# Patient Record
Sex: Female | Born: 1950 | Race: White | Hispanic: No | Marital: Single | Smoking: Never smoker
Health system: Southern US, Community
[De-identification: ages and names within clinical notes are randomized; demographics above are authoritative.]

## PROBLEM LIST (undated history)

## (undated) DIAGNOSIS — K219 Gastro-esophageal reflux disease without esophagitis: Secondary | ICD-10-CM

## (undated) DIAGNOSIS — F32A Depression, unspecified: Secondary | ICD-10-CM

## (undated) DIAGNOSIS — C801 Malignant (primary) neoplasm, unspecified: Secondary | ICD-10-CM

## (undated) DIAGNOSIS — J45909 Unspecified asthma, uncomplicated: Secondary | ICD-10-CM

## (undated) DIAGNOSIS — E785 Hyperlipidemia, unspecified: Secondary | ICD-10-CM

## (undated) DIAGNOSIS — R609 Edema, unspecified: Secondary | ICD-10-CM

## (undated) DIAGNOSIS — J309 Allergic rhinitis, unspecified: Secondary | ICD-10-CM

## (undated) DIAGNOSIS — M810 Age-related osteoporosis without current pathological fracture: Secondary | ICD-10-CM

## (undated) DIAGNOSIS — C50919 Malignant neoplasm of unspecified site of unspecified female breast: Secondary | ICD-10-CM

## (undated) DIAGNOSIS — F329 Major depressive disorder, single episode, unspecified: Secondary | ICD-10-CM

## (undated) DIAGNOSIS — I10 Essential (primary) hypertension: Secondary | ICD-10-CM

## (undated) DIAGNOSIS — Z9221 Personal history of antineoplastic chemotherapy: Secondary | ICD-10-CM

## (undated) HISTORY — PX: WRIST SURGERY: SHX841

## (undated) HISTORY — PX: MASTECTOMY: SHX3

---

## 2004-04-25 ENCOUNTER — Ambulatory Visit: Payer: Self-pay | Admitting: Unknown Physician Specialty

## 2006-08-03 ENCOUNTER — Emergency Department: Payer: Self-pay

## 2006-08-07 ENCOUNTER — Other Ambulatory Visit: Payer: Self-pay

## 2006-08-08 ENCOUNTER — Ambulatory Visit: Payer: Self-pay | Admitting: Orthopaedic Surgery

## 2008-10-26 ENCOUNTER — Ambulatory Visit: Payer: Self-pay | Admitting: Unknown Physician Specialty

## 2017-01-15 ENCOUNTER — Other Ambulatory Visit: Payer: Self-pay | Admitting: Student

## 2017-01-15 DIAGNOSIS — R197 Diarrhea, unspecified: Secondary | ICD-10-CM

## 2017-01-15 DIAGNOSIS — R110 Nausea: Secondary | ICD-10-CM

## 2017-01-16 ENCOUNTER — Other Ambulatory Visit
Admission: RE | Admit: 2017-01-16 | Discharge: 2017-01-16 | Disposition: A | Discharge status: Home / Self Care | Payer: Medicare Other | Source: Ambulatory Visit | Attending: Student | Admitting: Student

## 2017-01-16 DIAGNOSIS — R197 Diarrhea, unspecified: Secondary | ICD-10-CM | POA: Diagnosis present

## 2017-01-16 DIAGNOSIS — R634 Abnormal weight loss: Secondary | ICD-10-CM | POA: Diagnosis present

## 2017-01-16 LAB — GASTROINTESTINAL PANEL BY PCR, STOOL (REPLACES STOOL CULTURE)
ADENOVIRUS F40/41: NOT DETECTED
ASTROVIRUS: NOT DETECTED
Campylobacter species: NOT DETECTED
Cryptosporidium: NOT DETECTED
Cyclospora cayetanensis: NOT DETECTED
ENTAMOEBA HISTOLYTICA: NOT DETECTED
ENTEROAGGREGATIVE E COLI (EAEC): NOT DETECTED
ENTEROTOXIGENIC E COLI (ETEC): NOT DETECTED
Enteropathogenic E coli (EPEC): NOT DETECTED
GIARDIA LAMBLIA: NOT DETECTED
Norovirus GI/GII: NOT DETECTED
Plesimonas shigelloides: NOT DETECTED
ROTAVIRUS A: NOT DETECTED
SALMONELLA SPECIES: NOT DETECTED
Sapovirus (I, II, IV, and V): NOT DETECTED
Shiga like toxin producing E coli (STEC): NOT DETECTED
Shigella/Enteroinvasive E coli (EIEC): NOT DETECTED
Vibrio cholerae: NOT DETECTED
Vibrio species: NOT DETECTED
Yersinia enterocolitica: NOT DETECTED

## 2017-01-16 LAB — C DIFFICILE QUICK SCREEN W PCR REFLEX
C DIFFICILE (CDIFF) TOXIN: NEGATIVE
C DIFFICLE (CDIFF) ANTIGEN: NEGATIVE
C Diff interpretation: NOT DETECTED

## 2017-01-18 LAB — H. PYLORI ANTIGEN, STOOL: H. Pylori Stool Ag, Eia: NEGATIVE

## 2017-01-19 LAB — PANCREATIC ELASTASE, FECAL

## 2017-01-23 ENCOUNTER — Emergency Department: Payer: Medicare Other

## 2017-01-23 ENCOUNTER — Emergency Department
Admission: EM | Admit: 2017-01-23 | Discharge: 2017-01-24 | Disposition: A | Discharge status: Home / Self Care | Payer: Medicare Other | Attending: Emergency Medicine | Admitting: Emergency Medicine

## 2017-01-23 ENCOUNTER — Encounter: Payer: Self-pay | Admitting: *Deleted

## 2017-01-23 DIAGNOSIS — R5383 Other fatigue: Secondary | ICD-10-CM

## 2017-01-23 DIAGNOSIS — R1110 Vomiting, unspecified: Secondary | ICD-10-CM | POA: Diagnosis not present

## 2017-01-23 DIAGNOSIS — I951 Orthostatic hypotension: Secondary | ICD-10-CM

## 2017-01-23 DIAGNOSIS — R748 Abnormal levels of other serum enzymes: Secondary | ICD-10-CM

## 2017-01-23 DIAGNOSIS — R197 Diarrhea, unspecified: Secondary | ICD-10-CM | POA: Diagnosis present

## 2017-01-23 LAB — URINALYSIS, COMPLETE (UACMP) WITH MICROSCOPIC
Bacteria, UA: NONE SEEN
Bilirubin Urine: NEGATIVE
GLUCOSE, UA: NEGATIVE mg/dL
Hgb urine dipstick: NEGATIVE
Ketones, ur: 5 mg/dL — AB
Nitrite: NEGATIVE
PH: 5 (ref 5.0–8.0)
Protein, ur: NEGATIVE mg/dL
SPECIFIC GRAVITY, URINE: 1.012 (ref 1.005–1.030)

## 2017-01-23 LAB — COMPREHENSIVE METABOLIC PANEL
ALK PHOS: 45 U/L (ref 38–126)
ALT: 19 U/L (ref 14–54)
AST: 29 U/L (ref 15–41)
Albumin: 3.8 g/dL (ref 3.5–5.0)
Anion gap: 16 — ABNORMAL HIGH (ref 5–15)
BUN: 18 mg/dL (ref 6–20)
CALCIUM: 8.8 mg/dL — AB (ref 8.9–10.3)
CO2: 24 mmol/L (ref 22–32)
CREATININE: 1.46 mg/dL — AB (ref 0.44–1.00)
Chloride: 93 mmol/L — ABNORMAL LOW (ref 101–111)
GFR calc non Af Amer: 36 mL/min — ABNORMAL LOW (ref >60)
GFR, EST AFRICAN AMERICAN: 41 mL/min — AB (ref >60)
GLUCOSE: 129 mg/dL — AB (ref 65–99)
Potassium: 3 mmol/L — ABNORMAL LOW (ref 3.5–5.1)
SODIUM: 133 mmol/L — AB (ref 135–145)
Total Bilirubin: 1.1 mg/dL (ref 0.3–1.2)
Total Protein: 7.2 g/dL (ref 6.5–8.1)

## 2017-01-23 LAB — CBC
HCT: 44.2 % (ref 35.0–47.0)
Hemoglobin: 15.5 g/dL (ref 12.0–16.0)
MCH: 34.4 pg — AB (ref 26.0–34.0)
MCHC: 35 g/dL (ref 32.0–36.0)
MCV: 98.5 fL (ref 80.0–100.0)
PLATELETS: 391 10*3/uL (ref 150–440)
RBC: 4.49 MIL/uL (ref 3.80–5.20)
RDW: 12.1 % (ref 11.5–14.5)
WBC: 16.6 10*3/uL — ABNORMAL HIGH (ref 3.6–11.0)

## 2017-01-23 LAB — LIPASE, BLOOD: Lipase: 59 U/L — ABNORMAL HIGH (ref 11–51)

## 2017-01-23 MED ORDER — SODIUM CHLORIDE 0.9 % IV BOLUS (SEPSIS)
1000.0000 mL | Freq: Once | INTRAVENOUS | Status: AC
  Administered 2017-01-23: 1000 mL via INTRAVENOUS

## 2017-01-23 MED ORDER — ONDANSETRON 4 MG PO TBDP: 4.0000 mg | ORAL_TABLET | Freq: Three times a day (TID) | ORAL | 0 refills | Status: DC | PRN

## 2017-01-23 MED ORDER — POTASSIUM CHLORIDE CRYS ER 20 MEQ PO TBCR
40.0000 meq | EXTENDED_RELEASE_TABLET | Freq: Once | ORAL | Status: AC
  Administered 2017-01-23: 40 meq via ORAL
  Filled 2017-01-23: qty 2

## 2017-01-23 MED ORDER — IOPAMIDOL (ISOVUE-300) INJECTION 61%: 30.0000 mL | Freq: Once | INTRAVENOUS | Status: DC | PRN

## 2017-01-23 NOTE — ED Notes (Signed)
Pt returned from CT via stretcher.

## 2017-01-23 NOTE — ED Triage Notes (Signed)
Pt brought in via ems from home.  Pt had abd pain with n/v/d for several weeks.  Pt reports feeling dehydrated and weak.  Pt alert.  Speech clear.

## 2017-01-23 NOTE — ED Notes (Signed)
Pt taken to US via stretcher

## 2017-01-23 NOTE — ED Notes (Signed)
Pt doing well with barium contrast, has started on 2nd bottle at this time. No distress noted.
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
## 2017-01-23 NOTE — ED Notes (Addendum)
Pt states in June she had food poisoning which caused N&V&D. Pt states she has had N&V&D going on since then. States has been on antibiotics and will get better but N&V&D returns. Pt appears pale, states she is always pale. States today she got SOB when moving around. Pt is lying on stretcher, no distress noted at this time. Pt states EMS gave her nausea medication that helped her. Pt denies seeing blood in stool or vomit.

## 2017-01-23 NOTE — ED Notes (Signed)
Pt returned from U/S via stretcher. 

## 2017-01-23 NOTE — ED Notes (Signed)
Pt taken to CT via stretcher.

## 2017-01-23 NOTE — ED Notes (Signed)
EMS IV blown, taken out and this RN started a new IV.

## 2017-01-23 NOTE — ED Notes (Signed)
Pt has family at bedside

## 2017-01-23 NOTE — ED Notes (Signed)
Dr. Lord at bedside.  

## 2017-01-23 NOTE — ED Provider Notes (Signed)
-----------------------------------------   11:24 PM on 01/23/2017 -----------------------------------------  Patient CT scan and ultrasound are largely negative. Patient has follow-up tomorrow with GI medicine. Patient's lipase is borderline elevated. She states for the past one to months she has been having symptoms of feeling very full when eating, feeling nauseated like her food is not passing into her stomach. Given a normal CT and ultrasound, I discussed with the patient follow up with GI medicine as scheduled tomorrow to discuss further workup including possible endoscopy. Patient is agreeable to this plan. Denies any abdominal pain at all currently. On my exam she has a completely nontender abdomen. Urinalysis is pending. I discussed with the patient the urine is normal we will discharge with Zofran as needed for nausea and have the patient follow up with GI medicine tomorrow. Patient care signed out to Dr. Owens Shark.   Harvest Dark, MD 01/23/17 2325

## 2017-01-23 NOTE — ED Notes (Signed)
Pt finished drinking contrast, CT notified 

## 2017-01-23 NOTE — ED Triage Notes (Signed)
First Nurse Note:  C/O "digestive issues" since June when she was diagnosed with food poisoning. Today called EMS due to nausea.  #20 g saline lock to LAC started by EMS PTA, 100 cc NS and 4 mg Zofran IV given.  Patient is AAOx3.  Skin warm and dry.  NAD.

## 2017-01-24 ENCOUNTER — Ambulatory Visit
Admission: RE | Admit: 2017-01-24 | Discharge: 2017-01-24 | Disposition: A | Discharge status: Home / Self Care | Payer: Medicare Other | Source: Ambulatory Visit | Attending: Student | Admitting: Student

## 2017-02-15 ENCOUNTER — Encounter: Payer: Self-pay | Admitting: *Deleted

## 2017-02-16 ENCOUNTER — Ambulatory Visit: Payer: Medicare Other | Admitting: Anesthesiology

## 2017-02-16 ENCOUNTER — Ambulatory Visit
Admission: RE | Admit: 2017-02-16 | Discharge: 2017-02-16 | Disposition: A | Discharge status: Home / Self Care | Payer: Medicare Other | Source: Ambulatory Visit | Attending: Unknown Physician Specialty | Admitting: Unknown Physician Specialty

## 2017-02-16 ENCOUNTER — Encounter: Payer: Self-pay | Admitting: *Deleted

## 2017-02-16 ENCOUNTER — Encounter
Admission: RE | Disposition: A | Discharge status: Home / Self Care | Payer: Self-pay | Source: Ambulatory Visit | Attending: Unknown Physician Specialty

## 2017-02-16 DIAGNOSIS — K5730 Diverticulosis of large intestine without perforation or abscess without bleeding: Secondary | ICD-10-CM | POA: Insufficient documentation

## 2017-02-16 DIAGNOSIS — J45909 Unspecified asthma, uncomplicated: Secondary | ICD-10-CM | POA: Diagnosis not present

## 2017-02-16 DIAGNOSIS — K269 Duodenal ulcer, unspecified as acute or chronic, without hemorrhage or perforation: Secondary | ICD-10-CM | POA: Diagnosis not present

## 2017-02-16 DIAGNOSIS — D122 Benign neoplasm of ascending colon: Secondary | ICD-10-CM | POA: Diagnosis not present

## 2017-02-16 DIAGNOSIS — K222 Esophageal obstruction: Secondary | ICD-10-CM | POA: Diagnosis not present

## 2017-02-16 DIAGNOSIS — E785 Hyperlipidemia, unspecified: Secondary | ICD-10-CM | POA: Diagnosis not present

## 2017-02-16 DIAGNOSIS — R1084 Generalized abdominal pain: Secondary | ICD-10-CM | POA: Diagnosis present

## 2017-02-16 DIAGNOSIS — Z79899 Other long term (current) drug therapy: Secondary | ICD-10-CM | POA: Diagnosis not present

## 2017-02-16 DIAGNOSIS — K219 Gastro-esophageal reflux disease without esophagitis: Secondary | ICD-10-CM | POA: Diagnosis not present

## 2017-02-16 DIAGNOSIS — K2950 Unspecified chronic gastritis without bleeding: Secondary | ICD-10-CM | POA: Diagnosis not present

## 2017-02-16 DIAGNOSIS — I10 Essential (primary) hypertension: Secondary | ICD-10-CM | POA: Diagnosis not present

## 2017-02-16 DIAGNOSIS — Z853 Personal history of malignant neoplasm of breast: Secondary | ICD-10-CM | POA: Diagnosis not present

## 2017-02-16 DIAGNOSIS — F329 Major depressive disorder, single episode, unspecified: Secondary | ICD-10-CM | POA: Diagnosis not present

## 2017-02-16 HISTORY — DX: Essential (primary) hypertension: I10

## 2017-02-16 HISTORY — DX: Allergic rhinitis, unspecified: J30.9

## 2017-02-16 HISTORY — DX: Gastro-esophageal reflux disease without esophagitis: K21.9

## 2017-02-16 HISTORY — DX: Unspecified asthma, uncomplicated: J45.909

## 2017-02-16 HISTORY — DX: Depression, unspecified: F32.A

## 2017-02-16 HISTORY — DX: Age-related osteoporosis without current pathological fracture: M81.0

## 2017-02-16 HISTORY — DX: Major depressive disorder, single episode, unspecified: F32.9

## 2017-02-16 HISTORY — DX: Malignant (primary) neoplasm, unspecified: C80.1

## 2017-02-16 HISTORY — PX: COLONOSCOPY WITH PROPOFOL: SHX5780

## 2017-02-16 HISTORY — DX: Edema, unspecified: R60.9

## 2017-02-16 HISTORY — PX: ESOPHAGOGASTRODUODENOSCOPY (EGD) WITH PROPOFOL: SHX5813

## 2017-02-16 HISTORY — DX: Hyperlipidemia, unspecified: E78.5

## 2017-02-16 SURGERY — COLONOSCOPY WITH PROPOFOL
Anesthesia: General

## 2017-02-16 MED ORDER — EPHEDRINE SULFATE 50 MG/ML IJ SOLN
INTRAMUSCULAR | Status: DC | PRN
  Administered 2017-02-16 (×2): 10 mg via INTRAVENOUS
  Administered 2017-02-16: 5 mg via INTRAVENOUS

## 2017-02-16 MED ORDER — LIDOCAINE HCL (PF) 2 % IJ SOLN
INTRAMUSCULAR | Status: AC
  Filled 2017-02-16: qty 2

## 2017-02-16 MED ORDER — MIDAZOLAM HCL 2 MG/2ML IJ SOLN
INTRAMUSCULAR | Status: AC
  Filled 2017-02-16: qty 2

## 2017-02-16 MED ORDER — PROPOFOL 10 MG/ML IV BOLUS
INTRAVENOUS | Status: DC | PRN
  Administered 2017-02-16: 60 mg via INTRAVENOUS
  Administered 2017-02-16: 20 mg via INTRAVENOUS

## 2017-02-16 MED ORDER — EPHEDRINE SULFATE 50 MG/ML IJ SOLN
INTRAMUSCULAR | Status: AC
  Filled 2017-02-16: qty 1

## 2017-02-16 MED ORDER — SODIUM CHLORIDE 0.9 % IV SOLN: INTRAVENOUS | Status: DC

## 2017-02-16 MED ORDER — LIDOCAINE HCL (CARDIAC) 20 MG/ML IV SOLN
INTRAVENOUS | Status: DC | PRN
  Administered 2017-02-16: 40 mg via INTRAVENOUS

## 2017-02-16 MED ORDER — SODIUM CHLORIDE 0.9 % IV SOLN
INTRAVENOUS | Status: DC
  Administered 2017-02-16: 10:00:00 via INTRAVENOUS

## 2017-02-16 MED ORDER — PROPOFOL 500 MG/50ML IV EMUL
INTRAVENOUS | Status: AC
  Filled 2017-02-16: qty 50

## 2017-02-16 MED ORDER — PROPOFOL 500 MG/50ML IV EMUL
INTRAVENOUS | Status: DC | PRN
  Administered 2017-02-16: 120 ug/kg/min via INTRAVENOUS

## 2017-02-16 MED ORDER — FENTANYL CITRATE (PF) 100 MCG/2ML IJ SOLN
INTRAMUSCULAR | Status: AC
  Filled 2017-02-16: qty 2

## 2017-02-16 MED ORDER — PROPOFOL 10 MG/ML IV BOLUS
INTRAVENOUS | Status: AC
  Filled 2017-02-16: qty 20

## 2017-02-16 MED ORDER — GLYCOPYRROLATE 0.2 MG/ML IJ SOLN
INTRAMUSCULAR | Status: AC
  Filled 2017-02-16: qty 1

## 2017-02-16 MED ORDER — MIDAZOLAM HCL 2 MG/2ML IJ SOLN
INTRAMUSCULAR | Status: DC | PRN
Start: 2017-02-16 — End: 2017-02-16
  Administered 2017-02-16: 1 mg via INTRAVENOUS

## 2017-02-16 MED ORDER — GLYCOPYRROLATE 0.2 MG/ML IJ SOLN
INTRAMUSCULAR | Status: DC | PRN
  Administered 2017-02-16: .2 mg via INTRAVENOUS

## 2017-02-16 MED ORDER — FENTANYL CITRATE (PF) 100 MCG/2ML IJ SOLN
INTRAMUSCULAR | Status: DC | PRN
  Administered 2017-02-16: 50 ug via INTRAVENOUS

## 2017-02-16 NOTE — H&P (Signed)
Primary Care Physician:  Kirk Ruths, MD Primary Gastroenterologist:  Dr. Vira Agar  Pre-Procedure History & Physical: HPI:  Jenna Carlson is a 66 y.o. female is here for an endoscopy and colonoscopy.   Past Medical History:  Diagnosis Date  . Allergic rhinitis   . Asthma   . Cancer (HCC)    BREAST  . Depression   . Edema   . GERD (gastroesophageal reflux disease)   . Hyperlipidemia   . Hypertension   . Osteoporosis, postmenopausal     Past Surgical History:  Procedure Laterality Date  . MASTECTOMY    . WRIST SURGERY Left     Prior to Admission medications   Medication Sig Start Date End Date Taking? Authorizing Provider  azelastine (ASTELIN) 0.1 % nasal spray Place 1 spray into both nostrils 2 (two) times daily. Use in each nostril as directed   Yes [provider]  carvedilol (COREG) 25 MG tablet Take 25 mg by mouth 2 (two) times daily with a meal.   Yes [provider]  cetirizine (ZYRTEC) 10 MG tablet Take 10 mg by mouth daily.   Yes [provider]  EPINEPHrine 0.3 mg/0.3 mL IJ SOAJ injection Inject into the muscle once.   Yes [provider]  ergocalciferol (VITAMIN D2) 50000 units capsule Take 50,000 Units by mouth once a week.   Yes [provider]  fluticasone furoate-vilanterol (BREO ELLIPTA) 100-25 MCG/INH AEPB Inhale 1 puff into the lungs daily.   Yes [provider]  loperamide (IMODIUM A-D) 2 MG tablet Take 2 mg by mouth 4 (four) times daily as needed for diarrhea or loose stools.   Yes [provider]  montelukast (SINGULAIR) 10 MG tablet Take 10 mg by mouth at bedtime.   Yes [provider]  ondansetron (ZOFRAN ODT) 4 MG disintegrating tablet Take 1 tablet (4 mg total) by mouth every 8 (eight) hours as needed for nausea or vomiting. 01/23/17  Yes Harvest Dark, MD  prochlorperazine (COMPAZINE) 10 MG tablet Take 10 mg by mouth every 6 (six) hours as needed for nausea or  vomiting.   Yes [provider]  ranitidine (ZANTAC) 150 MG capsule Take 150 mg by mouth 2 (two) times daily.   Yes [provider]  sertraline (ZOLOFT) 50 MG tablet Take 50 mg by mouth daily.   Yes [provider]  simvastatin (ZOCOR) 40 MG tablet Take 40 mg by mouth daily.   Yes [provider]  torsemide (DEMADEX) 20 MG tablet Take 20 mg by mouth daily.   Yes [provider]  trandolapril-verapamil (TARKA) 4-240 MG tablet Take 1 tablet by mouth daily.   Yes [provider]    Allergies as of 02/05/2017 - Review Complete 01/23/2017  Allergen Reaction Noted  . Aspirin Tinitus 01/23/2017  . Codeine  01/23/2017  . Morphine and related Nausea And Vomiting 01/23/2017    History reviewed. No pertinent family history.  Social History   Social History  . Marital status: Single    Spouse name: N/A  . Number of children: N/A  . Years of education: N/A   Occupational History  . Not on file.   Social History Main Topics  . Smoking status: Never Smoker  . Smokeless tobacco: Never Used  . Alcohol use Yes     Comment: occasional wine  . Drug use: No  . Sexual activity: Not on file   Other Topics Concern  . Not on file   Social History Narrative  .  No narrative on file    Review of Systems: See HPI, otherwise negative ROS  Physical Exam: BP 113/77   Pulse 87   Temp (!) 96.8 F (36 C) (Tympanic)   Resp 18   Ht 5\' 6"  (1.676 m)   Wt 88.9 kg (196 lb)   SpO2 99%   BMI 31.64 kg/m  General:   Alert,  pleasant and cooperative in NAD Head:  Normocephalic and atraumatic. Neck:  Supple; no masses or thyromegaly. Lungs:  Clear throughout to auscultation.    Heart:  Regular rate and rhythm. Abdomen:  Soft, nontender and nondistended. Normal bowel sounds, without guarding, and without rebound.   Neurologic:  Alert and  oriented x4;  grossly normal neurologically.  Impression/Plan: Jenna Carlson is here for an endoscopy and  colonoscopy to be performed for diarrhea, unintentional weight loss.  Risks, benefits, limitations, and alternatives regarding  endoscopy and colonoscopy have been reviewed with the patient.  Questions have been answered.  All parties agreeable.   Gaylyn Cheers, MD  02/16/2017, 10:49 AM

## 2017-02-16 NOTE — Anesthesia Postprocedure Evaluation (Signed)
Anesthesia Post Note  Patient: Jenna Carlson  Procedure(s) Performed: Procedure(s) (LRB): COLONOSCOPY WITH PROPOFOL (N/A) ESOPHAGOGASTRODUODENOSCOPY (EGD) WITH PROPOFOL (N/A)  Patient location during evaluation: Endoscopy Anesthesia Type: General Level of consciousness: awake and alert Pain management: pain level controlled Vital Signs Assessment: post-procedure vital signs reviewed and stable Respiratory status: spontaneous breathing, nonlabored ventilation, respiratory function stable and patient connected to nasal cannula oxygen Cardiovascular status: blood pressure returned to baseline and stable Postop Assessment: no signs of nausea or vomiting Anesthetic complications: no     Last Vitals:  Vitals:   02/16/17 1224 02/16/17 1234  BP: 117/81 124/78  Pulse: 72 71  Resp: 14 12  Temp:    SpO2: 98% 98%    Last Pain:  Vitals:   02/16/17 1204  TempSrc: Tympanic                 Precious Haws Nelson Noone

## 2017-02-16 NOTE — Op Note (Signed)
[facility] Gastroenterology Patient Name: Jenna Carlson Procedure Date: 02/16/2017 10:52 AM MRN: [id number] Account #: [id number] Date of Birth: 11/26/1950 Admit Type: Outpatient Age: 66 Room: [facility] ENDO ROOM 1 Gender: Female Note Status: Finalized Procedure:            Upper GI endoscopy Indications:          Dysphagia, Heartburn, Suspected gastro-esophageal                        reflux disease Providers:            Manya Silvas, MD Referring MD:         Ocie Cornfield. Ouida Sills MD, MD (Referring MD) Medicines:            Propofol per Anesthesia Complications:        No immediate complications. Procedure:            Pre-Anesthesia Assessment:                       - After reviewing the risks and benefits, the patient                        was deemed in satisfactory condition to undergo the                        procedure.                       After obtaining informed consent, the endoscope was                        passed under direct vision. Throughout the procedure,                        the patient's blood pressure, pulse, and oxygen                        saturations were monitored continuously. The Endoscope                        was introduced through the mouth, and advanced to the                        second part of duodenum. The upper GI endoscopy was                        accomplished without difficulty. The patient tolerated                        the procedure well. Findings:      A moderate Schatzki ring (acquired) was found at the gastroesophageal       junction. GEJ 36cm. After passage into the stomach and examination in       retroflexed view it showed some dilatation and trace of blood. At the       end of the procedure A TTS dilator was passed through the scope.       Dilation with a 15-16.5-18 mm balloon dilator was performed to 15 mm.       The dilation site was examined and showed complete resolution of luminal       narrowing.  Localized mild inflammation characterized by congestion (edema) and       granularity was found in the gastric antrum. Biopsies were taken with a       cold forceps for histology. Biopsies were taken with a cold forceps for       Helicobacter pylori testing.      Four non-bleeding cratered and superficial duodenal ulcers with no       stigmata of bleeding were found in the duodenal bulb. Impression:           - Moderate Schatzki ring. Dilated.                       - Gastritis. Biopsied.                       - Multiple non-bleeding duodenal ulcers with no                        stigmata of bleeding. Recommendation:       - Await pathology results. Manya Silvas, MD 02/16/2017 11:16:52 AM This report has been signed electronically. Number of Addenda: 0 Note Initiated On: 02/16/2017 10:52 AM      [facility]

## 2017-02-16 NOTE — Transfer of Care (Signed)
Immediate Anesthesia Transfer of Care Note  Patient: Jenna Carlson  Procedure(s) Performed: Procedure(s): COLONOSCOPY WITH PROPOFOL (N/A) ESOPHAGOGASTRODUODENOSCOPY (EGD) WITH PROPOFOL (N/A)  Patient Location: PACU  Anesthesia Type:General  Level of Consciousness: awake, alert  and oriented  Airway & Oxygen Therapy: Patient Spontanous Breathing and Patient connected to nasal cannula oxygen  Post-op Assessment: Report given to RN and Post -op Vital signs reviewed and stable  Post vital signs: Reviewed and stable  Last Vitals:  Vitals:   02/16/17 1007 02/16/17 1204  BP: 113/77 (!) 96/58  Pulse: 87 69  Resp: 18 (!) 21  Temp: (!) 36 C (!) 35.9 C  SpO2: 99% 97%    Last Pain:  Vitals:   02/16/17 1204  TempSrc: Tympanic         Complications: No apparent anesthesia complications
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
## 2017-02-16 NOTE — Op Note (Signed)
[facility]astroenterology Patient Name: Jenna Carlson Procedure Date: 02/16/2017 10:51 AM MRN: [id number] Account #: [id number] Date of Birth: [birth year] Admit Type: Outpatient Age: 66 Room: [facility] ENDO ROOM 1 Gender: Female Note Status: Finalized Procedure:            Colonoscopy Indications:          Generalized abdominal pain Providers:            Manya Silvas, MD Referring MD:         Ocie Cornfield. Ouida Sills MD, MD (Referring MD) Medicines:            Propofol per Anesthesia Complications:        No immediate complications. Procedure:            Pre-Anesthesia Assessment:                       - After reviewing the risks and benefits, the patient                        was deemed in satisfactory condition to undergo the                        procedure.                       After obtaining informed consent, the colonoscope was                        passed under direct vision. Throughout the procedure,                        the patient's blood pressure, pulse, and oxygen                        saturations were monitored continuously. The                        Colonoscope was introduced through the anus and                        advanced to the the ascending colon. The colonoscopy                        was extremely difficult due to restricted mobility of                        the colon and a tortuous colon. Successful completion                        of the procedure was aided by changing endoscopes. The                        patient tolerated the procedure well. The quality of                        the bowel preparation was good. Findings:      I started the exam with an adult scope and this would not pass so I       changed to a pediatric scope and this did not pass so I switched to an  EGD scope and advanced to ascending colon.      Multiple small medium-mouthed diverticula were found in the sigmoid       colon and descending colon.  A small polyp was found in the ascending colon. The polyp was sessile.       The polyp was removed with a cold snare. Resection and retrieval were       complete. Impression:           - Diverticulosis in the sigmoid colon and in the                        descending colon.                       - One small polyp in the ascending colon, removed with                        a cold snare. Resected and retrieved. Recommendation:       - Await pathology results. consider CT enterography of                        colon to check the right colon completely. Manya Silvas, MD 02/16/2017 12:07:12 PM This report has been signed electronically. Number of Addenda: 0 Note Initiated On: 02/16/2017 10:51 AM Total Procedure Duration: 0 hours 40 minutes 31 seconds       [facility]

## 2017-02-16 NOTE — Anesthesia Post-op Follow-up Note (Signed)
Anesthesia QCDR form completed.        

## 2017-02-20 ENCOUNTER — Encounter: Payer: Self-pay | Admitting: Unknown Physician Specialty

## 2017-02-20 LAB — SURGICAL PATHOLOGY

## 2017-03-18 ENCOUNTER — Emergency Department: Payer: Medicare Other

## 2017-03-18 ENCOUNTER — Encounter: Payer: Self-pay | Admitting: Emergency Medicine

## 2017-03-18 ENCOUNTER — Emergency Department
Admission: EM | Admit: 2017-03-18 | Discharge: 2017-03-18 | Disposition: A | Discharge status: Home / Self Care | Payer: Medicare Other | Attending: Emergency Medicine | Admitting: Emergency Medicine

## 2017-03-18 DIAGNOSIS — Y999 Unspecified external cause status: Secondary | ICD-10-CM | POA: Insufficient documentation

## 2017-03-18 DIAGNOSIS — Y939 Activity, unspecified: Secondary | ICD-10-CM | POA: Diagnosis not present

## 2017-03-18 DIAGNOSIS — Y929 Unspecified place or not applicable: Secondary | ICD-10-CM | POA: Diagnosis not present

## 2017-03-18 DIAGNOSIS — I10 Essential (primary) hypertension: Secondary | ICD-10-CM | POA: Insufficient documentation

## 2017-03-18 DIAGNOSIS — J45909 Unspecified asthma, uncomplicated: Secondary | ICD-10-CM | POA: Diagnosis not present

## 2017-03-18 DIAGNOSIS — R55 Syncope and collapse: Secondary | ICD-10-CM | POA: Diagnosis not present

## 2017-03-18 DIAGNOSIS — R0789 Other chest pain: Secondary | ICD-10-CM | POA: Diagnosis not present

## 2017-03-18 DIAGNOSIS — R42 Dizziness and giddiness: Secondary | ICD-10-CM | POA: Diagnosis present

## 2017-03-18 DIAGNOSIS — Z853 Personal history of malignant neoplasm of breast: Secondary | ICD-10-CM | POA: Insufficient documentation

## 2017-03-18 DIAGNOSIS — E876 Hypokalemia: Secondary | ICD-10-CM | POA: Insufficient documentation

## 2017-03-18 DIAGNOSIS — Z79899 Other long term (current) drug therapy: Secondary | ICD-10-CM | POA: Insufficient documentation

## 2017-03-18 DIAGNOSIS — W010XXA Fall on same level from slipping, tripping and stumbling without subsequent striking against object, initial encounter: Secondary | ICD-10-CM | POA: Diagnosis not present

## 2017-03-18 LAB — CBC
HCT: 39.1 % (ref 35.0–47.0)
Hemoglobin: 13.5 g/dL (ref 12.0–16.0)
MCH: 33.6 pg (ref 26.0–34.0)
MCHC: 34.6 g/dL (ref 32.0–36.0)
MCV: 97 fL (ref 80.0–100.0)
PLATELETS: 418 10*3/uL (ref 150–440)
RBC: 4.03 MIL/uL (ref 3.80–5.20)
RDW: 13.1 % (ref 11.5–14.5)
WBC: 16 10*3/uL — AB (ref 3.6–11.0)

## 2017-03-18 LAB — BASIC METABOLIC PANEL
ANION GAP: 12 (ref 5–15)
BUN: 15 mg/dL (ref 6–20)
CALCIUM: 8.9 mg/dL (ref 8.9–10.3)
CO2: 26 mmol/L (ref 22–32)
Chloride: 99 mmol/L — ABNORMAL LOW (ref 101–111)
Creatinine, Ser: 1.16 mg/dL — ABNORMAL HIGH (ref 0.44–1.00)
GFR, EST AFRICAN AMERICAN: 55 mL/min — AB (ref >60)
GFR, EST NON AFRICAN AMERICAN: 47 mL/min — AB (ref >60)
Glucose, Bld: 124 mg/dL — ABNORMAL HIGH (ref 65–99)
POTASSIUM: 2.8 mmol/L — AB (ref 3.5–5.1)
Sodium: 137 mmol/L (ref 135–145)

## 2017-03-18 LAB — TROPONIN I

## 2017-03-18 MED ORDER — SODIUM CHLORIDE 0.9 % IV BOLUS (SEPSIS)
500.0000 mL | Freq: Once | INTRAVENOUS | Status: AC
Start: 2017-03-18 — End: 2017-03-18
  Administered 2017-03-18: 500 mL via INTRAVENOUS

## 2017-03-18 MED ORDER — POTASSIUM CHLORIDE 10 MEQ/100ML IV SOLN
10.0000 meq | Freq: Once | INTRAVENOUS | Status: AC
  Administered 2017-03-18: 10 meq via INTRAVENOUS
  Filled 2017-03-18: qty 100

## 2017-03-18 MED ORDER — POTASSIUM CHLORIDE CRYS ER 20 MEQ PO TBCR
20.0000 meq | EXTENDED_RELEASE_TABLET | Freq: Once | ORAL | Status: AC
  Administered 2017-03-18: 20 meq via ORAL
  Filled 2017-03-18: qty 1

## 2017-03-18 MED ORDER — POTASSIUM CHLORIDE ER 20 MEQ PO TBCR: 20.0000 meq | EXTENDED_RELEASE_TABLET | Freq: Two times a day (BID) | ORAL | 0 refills | Status: DC

## 2017-03-18 NOTE — ED Notes (Signed)
Pt attempted to give urine sample again. The first one she missed the cup. pta sat on the toilet for about 10 minutes without success. Offered water and pop which pt already has.

## 2017-03-18 NOTE — ED Triage Notes (Signed)
Patient from [facility] with complaint of dizziness and hypotension as reported by the [facility] RN. Patient states that she has been dizzy and lightheaded since she started a new BP med. Patient fell this morning striking her right ribs and right arm. Denies LOC. Drexel MD Requesting IV fluids

## 2017-03-18 NOTE — Discharge Instructions (Signed)
Return to the ER for new or worsening dizziness or lightheadedness, recurrent episodes of feeling like you are about to pass out, difficulty breathing, cough, fever, chest pain, palpitations, vomiting or diarrhea, urinary symptoms, or any other new or worsening symptoms that concern you. You should take the potassium pill as prescribed. Discontinue the torsemide, and half the dose of the Tarka. Follow-up with your doctor this week. You should have your urine checked at that time to completely rule out urinary tract infection.

## 2017-03-18 NOTE — ED Provider Notes (Signed)
[facility] Emergency Department Provider Note ____________________________________________   First MD Initiated Contact with Patient 03/18/17 1508     (approximate)  I have reviewed the triage vital signs and the nursing notes.   HISTORY  Chief Complaint Dizziness    HPI Jenna Carlson is a 66 y.o. female with past history as below, who presents with dizziness for the last few days, gradual onset, described as lightheadedness, and associated with an episode yesterday in which she felt increasingly dizzy and it caused her to fall. Patient states that she fell onto her right arm and right side. She denies hitting her head. Patient states that she felt lightheaded but denies chest pain, palpitations, difficulty breathing, or headache. She states that she lost approximately 50 pounds over the last 3 months due to duodenal ulcers and has had decreased by mouth intake over this time. She states that her primary care doctor recently decreased the dose of one of her blood pressure medications but she still is on another blood pressure medication and water pill.  Patient went to [facility] today and was found to be hypotensive to 80s/60s so was sent to the ED for eval.    Past Medical History:  Diagnosis Date  . Allergic rhinitis   . Asthma   . Cancer (HCC)    BREAST  . Depression   . Edema   . GERD (gastroesophageal reflux disease)   . Hyperlipidemia   . Hypertension   . Osteoporosis, postmenopausal     There are no active problems to display for this patient.   Past Surgical History:  Procedure Laterality Date  . COLONOSCOPY WITH PROPOFOL N/A 02/16/2017   Procedure: COLONOSCOPY WITH PROPOFOL;  Surgeon: Manya Silvas, MD;  Location: [facility] ENDOSCOPY;  Service: Endoscopy;  Laterality: N/A;  . ESOPHAGOGASTRODUODENOSCOPY (EGD) WITH PROPOFOL N/A 02/16/2017   Procedure: ESOPHAGOGASTRODUODENOSCOPY (EGD) WITH PROPOFOL;  Surgeon: Manya Silvas, MD;  Location: [facility]  ENDOSCOPY;  Service: Endoscopy;  Laterality: N/A;  . MASTECTOMY    . WRIST SURGERY Left     Prior to Admission medications   Medication Sig Start Date End Date Taking? Authorizing Provider  carvedilol (COREG) 25 MG tablet Take 25 mg by mouth 2 (two) times daily with a meal.   Yes [provider]  cetirizine (ZYRTEC) 10 MG tablet Take 10 mg by mouth daily.   Yes [provider]  ergocalciferol (VITAMIN D2) 50000 units capsule Take 50,000 Units by mouth once a week.   Yes [provider]  montelukast (SINGULAIR) 10 MG tablet Take 10 mg by mouth at bedtime.   Yes [provider]  sertraline (ZOLOFT) 50 MG tablet Take 50 mg by mouth daily.   Yes [provider]  simvastatin (ZOCOR) 40 MG tablet Take 40 mg by mouth daily.   Yes [provider]  torsemide (DEMADEX) 20 MG tablet Take 20 mg by mouth daily.   Yes [provider]  trandolapril-verapamil (TARKA) 4-240 MG tablet Take 1 tablet by mouth daily.   Yes [provider]  azelastine (ASTELIN) 0.1 % nasal spray Place 1 spray into both nostrils 2 (two) times daily. Use in each nostril as directed    [provider]  EPINEPHrine 0.3 mg/0.3 mL IJ SOAJ injection Inject into the muscle once.    [provider]  fluticasone furoate-vilanterol (BREO ELLIPTA) 100-25 MCG/INH AEPB Inhale 1 puff into the lungs daily.    [provider]  ondansetron (ZOFRAN ODT) 4 MG disintegrating  tablet Take 1 tablet (4 mg total) by mouth every 8 (eight) hours as needed for nausea or vomiting. 01/23/17   Harvest Dark, MD  potassium chloride 20 MEQ TBCR Take 20 mEq by mouth 2 (two) times daily. 03/18/17   Arta Silence, MD  ranitidine (ZANTAC) 150 MG capsule Take 150 mg by mouth 2 (two) times daily.    [provider]    Allergies Aspirin; Codeine; Hydrochlorothiazide; and Morphine and related  History reviewed. No pertinent family history.  Social  History Social History  Substance Use Topics  . Smoking status: Never Smoker  . Smokeless tobacco: Never Used  . Alcohol use Yes     Comment: occasional wine    Review of Systems  Constitutional: No fever/chills Eyes: No redness. ENT: No sore throat. Cardiovascular: Denies chest pain. Respiratory: Denies shortness of breath. Gastrointestinal: Positive for nausea, no vomiting.    Genitourinary: Negative for dysuria or frequency.  Musculoskeletal: Negative for back pain. Positive for R rib pain. Skin: Negative for rash.  Positive for R forearm skin tear.  Neurological: Negative for headaches, focal weakness or numbness.   ____________________________________________   PHYSICAL EXAM:  VITAL SIGNS: ED Triage Vitals  Enc Vitals Group     BP 03/18/17 1431 111/88     Pulse Rate 03/18/17 1431 93     Resp 03/18/17 1431 17     Temp 03/18/17 1431 98.3 F (36.8 C)     Temp Source 03/18/17 1431 Oral     SpO2 03/18/17 1431 97 %     Weight 03/18/17 1433 170 lb (77.1 kg)     Height 03/18/17 1433 5\' 6"  (1.676 m)     Head Circumference --      Peak Flow --      Pain Score --      Pain Loc --      Pain Edu? --      Excl. in [place]? --     Constitutional: Alert and oriented. Well appearing and in no acute distress. Eyes: Conjunctivae are normal.  EOMI. PERRLA.  Head: Atraumatic. Nose: No congestion/rhinnorhea. Mouth/Throat: Mucous membranes are moist.   Neck: Normal range of motion.  Cardiovascular: Normal rate, regular rhythm. Grossly normal heart sounds.  Good peripheral circulation.  R lower anterolateral rib tenderness.  Respiratory: Normal respiratory effort.  No retractions. Lungs CTAB. Gastrointestinal: Soft and nontender. No distention.  Genitourinary: No CVA tenderness. Musculoskeletal: No lower extremity edema.  Extremities warm and well perfused.  Neurologic:  Normal speech and language. No gross focal neurologic deficits are appreciated.  Motor and sensory intact in all  extremities.  Normal coordination.  Skin:  Skin is warm and dry. No rash noted.  R forearm approx 10cm superficial skin tear.  Psychiatric: Mood and affect are normal. Speech and behavior are normal.  ____________________________________________   LABS (all labs ordered are listed, but only abnormal results are displayed)  Labs Reviewed  CBC - Abnormal; Notable for the following:       Result Value   WBC 16.0 (*)    All other components within normal limits  BASIC METABOLIC PANEL - Abnormal; Notable for the following:    Potassium 2.8 (*)    Chloride 99 (*)    Glucose, Bld 124 (*)    Creatinine, Ser 1.16 (*)    GFR calc non Af Amer 47 (*)    GFR calc Af Amer 55 (*)    All other components within normal limits  TROPONIN I  URINALYSIS, COMPLETE (  UACMP) WITH MICROSCOPIC   ____________________________________________  EKG  ED ECG REPORT I, Arta Silence, the attending physician, personally viewed and interpreted this ECG.  Date: 03/18/2017 EKG Time: 1446 Rate: 86 Rhythm: normal sinus rhythm QRS Axis: normal Intervals: prolonged QTc ST/T Wave abnormalities: nonspecific ST segment flattening in II, III, V5, V6 Narrative Interpretation: no evidence of acute ischemia; no skin change when compared to EKG of 01/24/2017  ____________________________________________  RADIOLOGY  Rib XR: Possible nondisplaced right seventh, eighth and ninth rib  fracture      ____________________________________________   PROCEDURES  Procedure(s) performed: No    Critical Care performed: No ____________________________________________   INITIAL IMPRESSION / ASSESSMENT AND PLAN / ED COURSE  Pertinent labs & imaging results that were available during my care of the patient were reviewed by me and considered in my medical decision making (see chart for details).  67 year old female with past medical history as noted presents with dizziness for the last several days, with an  episode of near syncope yesterday causing her to fall. She was hypotensive in the outpatient clinic today. In the ED, vital signs are normal, blood pressure has remained stable, and her exam is as described. Suspect most likely medication side effect due to her decreased weight and decreased PO intake over the last few months; also consider dehydration, other metabolic cause, infection, or less likely cardiac. Plan: Basic labs, troponin, fluids, rib series, and reassess. Tetanus is up-to-date. Anticipate likely discharge home if patient remains stable and workup is negative. Kingsley suggested further decrease of her blood pressure medication and stopping her diuretic.     ----------------------------------------- 9:01 PM on 03/18/2017 -----------------------------------------  Patient's workup reveals possible right sided nondisplaced multiple rib fractures.  BMP is significant for hypokalemia which patient has prior history of, and which is likely due to her diuretic. She also has elevated white blood cell count. Patient has been unable to provide a urine sample despite multiple attempts; she is urinating normally but was unable to aim into the cup. She has now been waiting to try to urinate again for a few hours, and she states that she does not want to wait further and wants to go home. Although the white count was elevated, patient has no urinary symptoms and no fever, my suspicion for UTI is low. Patient understands without obtaining a UA I cannot fully rule it out and that it could be a source of her symptoms. Patient states she will follow-up with her doctor this week and try to have her urine sampled at that time.  As per the plan stated in her note from [facility] today, I instructed patient to discontinue the torsemide, and a half the tarka.  I will also prescribe potassium repletion.   I had discussion with patient about the rib fractures and the fact that given that she has possible 3 contiguous rib  fractures she is at increased risk for complications including atelectasis and pneumonia. I explained that we sometimes will admit patients to the hospital for this. Patient states that she does not want to stay in the hospital, and feels comfortable to go home. I gave her extensive return precautions both for the dizziness as well as for the rib fractures. Patient was given incentive spirometer.    ____________________________________________   FINAL CLINICAL IMPRESSION(S) / ED DIAGNOSES  Final diagnoses:  Near syncope  Hypokalemia      NEW MEDICATIONS STARTED DURING THIS VISIT:  Discharge Medication List as of 03/18/2017  9:14 PM  START taking these medications   Details  potassium chloride 20 MEQ TBCR Take 20 mEq by mouth 2 (two) times daily., Starting Sun 03/18/2017, Print         Note:  This document was prepared using Dragon voice recognition software and may include unintentional dictation errors.    Arta Silence, MD 03/19/17 [id number]

## 2017-05-16 ENCOUNTER — Other Ambulatory Visit: Payer: Self-pay

## 2017-05-16 ENCOUNTER — Inpatient Hospital Stay
Admission: EM | Admit: 2017-05-16 | Discharge: 2017-05-22 | DRG: 640 | Disposition: A | Discharge status: Skilled Nursing Facility | Payer: Medicare Other | Attending: Internal Medicine | Admitting: Internal Medicine

## 2017-05-16 DIAGNOSIS — E43 Unspecified severe protein-calorie malnutrition: Secondary | ICD-10-CM | POA: Diagnosis present

## 2017-05-16 DIAGNOSIS — M810 Age-related osteoporosis without current pathological fracture: Secondary | ICD-10-CM | POA: Diagnosis present

## 2017-05-16 DIAGNOSIS — K529 Noninfective gastroenteritis and colitis, unspecified: Secondary | ICD-10-CM

## 2017-05-16 DIAGNOSIS — Z6826 Body mass index (BMI) 26.0-26.9, adult: Secondary | ICD-10-CM

## 2017-05-16 DIAGNOSIS — D379 Neoplasm of uncertain behavior of digestive organ, unspecified: Secondary | ICD-10-CM

## 2017-05-16 DIAGNOSIS — K219 Gastro-esophageal reflux disease without esophagitis: Secondary | ICD-10-CM | POA: Diagnosis present

## 2017-05-16 DIAGNOSIS — K2981 Duodenitis with bleeding: Secondary | ICD-10-CM | POA: Diagnosis present

## 2017-05-16 DIAGNOSIS — R197 Diarrhea, unspecified: Secondary | ICD-10-CM

## 2017-05-16 DIAGNOSIS — E785 Hyperlipidemia, unspecified: Secondary | ICD-10-CM | POA: Diagnosis present

## 2017-05-16 DIAGNOSIS — R634 Abnormal weight loss: Secondary | ICD-10-CM | POA: Diagnosis present

## 2017-05-16 DIAGNOSIS — J45909 Unspecified asthma, uncomplicated: Secondary | ICD-10-CM | POA: Diagnosis present

## 2017-05-16 DIAGNOSIS — K269 Duodenal ulcer, unspecified as acute or chronic, without hemorrhage or perforation: Secondary | ICD-10-CM | POA: Diagnosis present

## 2017-05-16 DIAGNOSIS — Z79899 Other long term (current) drug therapy: Secondary | ICD-10-CM | POA: Diagnosis not present

## 2017-05-16 DIAGNOSIS — Z91041 Radiographic dye allergy status: Secondary | ICD-10-CM

## 2017-05-16 DIAGNOSIS — K222 Esophageal obstruction: Secondary | ICD-10-CM | POA: Diagnosis present

## 2017-05-16 DIAGNOSIS — R531 Weakness: Secondary | ICD-10-CM

## 2017-05-16 DIAGNOSIS — K922 Gastrointestinal hemorrhage, unspecified: Secondary | ICD-10-CM

## 2017-05-16 DIAGNOSIS — R1013 Epigastric pain: Secondary | ICD-10-CM

## 2017-05-16 DIAGNOSIS — Z885 Allergy status to narcotic agent status: Secondary | ICD-10-CM

## 2017-05-16 DIAGNOSIS — F329 Major depressive disorder, single episode, unspecified: Secondary | ICD-10-CM | POA: Diagnosis present

## 2017-05-16 DIAGNOSIS — Z7951 Long term (current) use of inhaled steroids: Secondary | ICD-10-CM

## 2017-05-16 DIAGNOSIS — Z8711 Personal history of peptic ulcer disease: Secondary | ICD-10-CM | POA: Diagnosis not present

## 2017-05-16 DIAGNOSIS — Z888 Allergy status to other drugs, medicaments and biological substances status: Secondary | ICD-10-CM | POA: Diagnosis not present

## 2017-05-16 DIAGNOSIS — E878 Other disorders of electrolyte and fluid balance, not elsewhere classified: Secondary | ICD-10-CM | POA: Diagnosis not present

## 2017-05-16 DIAGNOSIS — Z886 Allergy status to analgesic agent status: Secondary | ICD-10-CM | POA: Diagnosis not present

## 2017-05-16 DIAGNOSIS — I10 Essential (primary) hypertension: Secondary | ICD-10-CM | POA: Diagnosis present

## 2017-05-16 DIAGNOSIS — E876 Hypokalemia: Secondary | ICD-10-CM | POA: Diagnosis present

## 2017-05-16 DIAGNOSIS — Z91012 Allergy to eggs: Secondary | ICD-10-CM

## 2017-05-16 LAB — CBC
HEMATOCRIT: 36.7 % (ref 35.0–47.0)
HEMOGLOBIN: 12.4 g/dL (ref 12.0–16.0)
MCH: 33.5 pg (ref 26.0–34.0)
MCHC: 33.9 g/dL (ref 32.0–36.0)
MCV: 98.7 fL (ref 80.0–100.0)
Platelets: 274 10*3/uL (ref 150–440)
RBC: 3.71 MIL/uL — AB (ref 3.80–5.20)
RDW: 14.7 % — ABNORMAL HIGH (ref 11.5–14.5)
WBC: 10 10*3/uL (ref 3.6–11.0)

## 2017-05-16 LAB — URINALYSIS, COMPLETE (UACMP) WITH MICROSCOPIC
BACTERIA UA: NONE SEEN
BILIRUBIN URINE: NEGATIVE
Glucose, UA: NEGATIVE mg/dL
HGB URINE DIPSTICK: NEGATIVE
Ketones, ur: 5 mg/dL — AB
NITRITE: NEGATIVE
PROTEIN: NEGATIVE mg/dL
Specific Gravity, Urine: 1.001 — ABNORMAL LOW (ref 1.005–1.030)
pH: 7 (ref 5.0–8.0)

## 2017-05-16 LAB — BASIC METABOLIC PANEL
ANION GAP: 14 (ref 5–15)
BUN: 7 mg/dL (ref 6–20)
CALCIUM: 8 mg/dL — AB (ref 8.9–10.3)
CO2: 20 mmol/L — AB (ref 22–32)
Chloride: 101 mmol/L (ref 101–111)
Creatinine, Ser: 0.74 mg/dL (ref 0.44–1.00)
Glucose, Bld: 108 mg/dL — ABNORMAL HIGH (ref 65–99)
POTASSIUM: 2.2 mmol/L — AB (ref 3.5–5.1)
Sodium: 135 mmol/L (ref 135–145)

## 2017-05-16 LAB — MAGNESIUM: MAGNESIUM: 1.3 mg/dL — AB (ref 1.7–2.4)

## 2017-05-16 MED ORDER — POTASSIUM CHLORIDE 10 MEQ/100ML IV SOLN
10.0000 meq | INTRAVENOUS | Status: AC
  Administered 2017-05-16 – 2017-05-17 (×4): 10 meq via INTRAVENOUS
  Filled 2017-05-16 (×7): qty 100

## 2017-05-16 MED ORDER — FAMOTIDINE IN NACL 20-0.9 MG/50ML-% IV SOLN
20.0000 mg | Freq: Once | INTRAVENOUS | Status: AC
  Administered 2017-05-16: 20 mg via INTRAVENOUS
  Filled 2017-05-16: qty 50

## 2017-05-16 MED ORDER — POTASSIUM CHLORIDE 20 MEQ PO PACK
40.0000 meq | PACK | Freq: Once | ORAL | Status: AC
  Administered 2017-05-16: 40 meq via ORAL
  Filled 2017-05-16: qty 2

## 2017-05-16 MED ORDER — METOCLOPRAMIDE HCL 5 MG/ML IJ SOLN
10.0000 mg | Freq: Once | INTRAMUSCULAR | Status: AC
  Administered 2017-05-16: 10 mg via INTRAVENOUS
  Filled 2017-05-16: qty 2

## 2017-05-16 MED ORDER — SODIUM CHLORIDE 0.9 % IV BOLUS (SEPSIS)
1000.0000 mL | Freq: Once | INTRAVENOUS | Status: AC
  Administered 2017-05-16: 1000 mL via INTRAVENOUS

## 2017-05-16 MED ORDER — MAGNESIUM SULFATE 4 GM/100ML IV SOLN
4.0000 g | Freq: Once | INTRAVENOUS | Status: AC
  Administered 2017-05-17: 4 g via INTRAVENOUS
  Filled 2017-05-16: qty 100

## 2017-05-16 NOTE — H&P (Signed)
Jenna Carlson at [place] NAME: Kenlyn Lose    MR#:  [id number]  DATE OF BIRTH:  09-Feb-1951  DATE OF ADMISSION:  05/16/2017  PRIMARY CARE PHYSICIAN: Kirk Ruths, MD   REQUESTING/REFERRING PHYSICIAN: Joni Fears, MD  CHIEF COMPLAINT:   Chief Complaint  Patient presents with  . Weakness    HISTORY OF PRESENT ILLNESS:  Jenna Carlson  is a 66 y.o. female who presents with progressive weakness.  Patient has had chronic diarrhea and chronic duodenal ulcers, and states that she has had about a 70 pound weight loss over the last several months.  She has anorexia generally due to the symptoms of her ulcers.  She has been following with GI and they were trying to schedule MRI of her abdomen has the next step in diagnostic workup.  She came to the ED today because she was too weak to climb up her steps at home.  Evaluation here shows hypokalemia and hypomagnesemia.  She also had an elevated gastrin level.  Hospitalists were called for admission for electrolyte correction and further evaluation  PAST MEDICAL HISTORY:   Past Medical History:  Diagnosis Date  . Allergic rhinitis   . Asthma   . Cancer (HCC)    BREAST  . Depression   . Edema   . GERD (gastroesophageal reflux disease)   . Hyperlipidemia   . Hypertension   . Osteoporosis, postmenopausal     PAST SURGICAL HISTORY:   Past Surgical History:  Procedure Laterality Date  . COLONOSCOPY WITH PROPOFOL N/A 02/16/2017   Procedure: COLONOSCOPY WITH PROPOFOL;  Surgeon: Manya Silvas, MD;  Location[facility] ENDOSCOPY;  Service: Endoscopy;  Laterality: N/A;  . ESOPHAGOGASTRODUODENOSCOPY (EGD) WITH PROPOFOL N/A 02/16/2017   Procedure: ESOPHAGOGASTRODUODENOSCOPY (EGD) WITH PROPOFOL;  Surgeon: Manya Silvas, MD;  Location: [facility] ENDOSCOPY;  Service: Endoscopy;  Laterality: N/A;  . MASTECTOMY    . WRIST SURGERY Left     SOCIAL HISTORY:   Social History   Tobacco Use  .  Smoking status: Never Smoker  . Smokeless tobacco: Never Used  Substance Use Topics  . Alcohol use: Yes    Comment: occasional wine    FAMILY HISTORY:   Family History  Problem Relation Age of Onset  . Heart attack Father   . Hypertension Other     DRUG ALLERGIES:   Allergies  Allergen Reactions  . Iodinated Diagnostic Agents Shortness Of Breath  . Aspirin Tinitus  . Codeine   . Eggs Or Egg-Derived Products Other (See Comments)    On testing  . Hydrochlorothiazide Other (See Comments)  . Morphine And Related Nausea And Vomiting    MEDICATIONS AT HOME:   Prior to Admission medications   Medication Sig Start Date End Date Taking? Authorizing Provider  azelastine (ASTELIN) 0.1 % nasal spray Place 1 spray into both nostrils 2 (two) times daily. Use in each nostril as directed   Yes [provider]  carvedilol (COREG) 25 MG tablet Take 25 mg by mouth 2 (two) times daily with a meal.   Yes [provider]  cetirizine (ZYRTEC) 10 MG tablet Take 10 mg by mouth daily.   Yes [provider]  EPINEPHrine 0.3 mg/0.3 mL IJ SOAJ injection Inject into the muscle once.   Yes [provider]  ergocalciferol (VITAMIN D2) 50000 units capsule Take 50,000 Units by mouth once a week.   Yes [provider]  fluticasone furoate-vilanterol (BREO ELLIPTA) 100-25 MCG/INH AEPB Inhale 1  puff into the lungs daily.   Yes [provider]  montelukast (SINGULAIR) 10 MG tablet Take 10 mg by mouth at bedtime.   Yes [provider]  omeprazole (PRILOSEC) 40 MG capsule Take 40 mg by mouth 2 (two) times daily. 03/09/17 03/09/18 Yes [provider]  potassium chloride 20 MEQ TBCR Take 20 mEq by mouth 2 (two) times daily. 03/18/17  Yes Arta Silence, MD  promethazine (PHENERGAN) 12.5 MG tablet Take 1 tablet by mouth every 6 (six) hours as needed. 05/09/17 05/16/17 Yes [provider]  sertraline (ZOLOFT) 50 MG tablet Take 50 mg by  mouth daily.   Yes [provider]  simvastatin (ZOCOR) 40 MG tablet Take 40 mg by mouth daily.   Yes [provider]  ondansetron (ZOFRAN ODT) 4 MG disintegrating tablet Take 1 tablet (4 mg total) by mouth every 8 (eight) hours as needed for nausea or vomiting. 01/23/17   Harvest Dark, MD  ranitidine (ZANTAC) 150 MG capsule Take 150 mg by mouth 2 (two) times daily.    [provider]  torsemide (DEMADEX) 20 MG tablet Take 20 mg by mouth daily.    [provider]  trandolapril-verapamil (TARKA) 4-240 MG tablet Take 1 tablet by mouth daily.    [provider]    REVIEW OF SYSTEMS:  Review of Systems  Constitutional: Negative for chills, fever, malaise/fatigue and weight loss.  HENT: Negative for ear pain, hearing loss and tinnitus.   Eyes: Negative for blurred vision, double vision, pain and redness.  Respiratory: Negative for cough, hemoptysis and shortness of breath.   Cardiovascular: Negative for chest pain, palpitations, orthopnea and leg swelling.  Gastrointestinal: Positive for abdominal pain and nausea. Negative for constipation, diarrhea and vomiting.  Genitourinary: Negative for dysuria, frequency and hematuria.  Musculoskeletal: Negative for back pain, joint pain and neck pain.  Skin:       No acne, rash, or lesions  Neurological: Positive for weakness. Negative for dizziness, tremors and focal weakness.  Endo/Heme/Allergies: Negative for polydipsia. Does not bruise/bleed easily.  Psychiatric/Behavioral: Negative for depression. The patient is not nervous/anxious and does not have insomnia.      VITAL SIGNS:   Vitals:   05/16/17 2030 05/16/17 2100 05/16/17 2130 05/16/17 2200  BP: 137/90 129/90 134/76 (!) 137/96  Pulse: 69 74 80 76  Resp: 15 15 15 15   Temp:      TempSrc:      SpO2: 100% 99% 99% 98%  Weight:      Height:       Wt Readings from Last 3 Encounters:  05/16/17 70.8 kg (156 lb)  03/18/17 77.1 kg (170 lb)   02/16/17 88.9 kg (196 lb)    PHYSICAL EXAMINATION:  Physical Exam  Vitals reviewed. Constitutional: She is oriented to person, place, and time. She appears well-developed and well-nourished. No distress.  HENT:  Head: Normocephalic and atraumatic.  Mouth/Throat: Oropharynx is clear and moist.  Eyes: Conjunctivae and EOM are normal. Pupils are equal, round, and reactive to light. No scleral icterus.  Neck: Normal range of motion. Neck supple. No JVD present. No thyromegaly present.  Cardiovascular: Normal rate, regular rhythm and intact distal pulses. Exam reveals no gallop and no friction rub.  No murmur heard. Respiratory: Effort normal and breath sounds normal. No respiratory distress. She has no wheezes. She has no rales.  GI: Soft. Bowel sounds are normal. She exhibits no distension. There is tenderness.  Musculoskeletal: Normal range of motion. She exhibits no edema.  No arthritis, no gout  Lymphadenopathy:    She has no cervical adenopathy.  Neurological: She is alert and oriented to person, place, and time. No cranial nerve deficit.  No dysarthria, no aphasia  Skin: Skin is warm and dry. No rash noted. No erythema.  Psychiatric: She has a normal mood and affect. Her behavior is normal. Judgment and thought content normal.    LABORATORY PANEL:   CBC Recent Labs  Lab 05/16/17 1750  WBC 10.0  HGB 12.4  HCT 36.7  PLT 274   ------------------------------------------------------------------------------------------------------------------  Chemistries  Recent Labs  Lab 05/16/17 1750  NA 135  K 2.2*  CL 101  CO2 20*  GLUCOSE 108*  BUN 7  CREATININE 0.74  CALCIUM 8.0*   ------------------------------------------------------------------------------------------------------------------  Cardiac Enzymes No results for input(s): TROPONINI in the last 168  hours. ------------------------------------------------------------------------------------------------------------------  RADIOLOGY:  No results found.  EKG:   Orders placed or performed during the hospital encounter of 05/16/17  . ED EKG  . ED EKG  . EKG 12-Lead  . EKG 12-Lead    IMPRESSION AND PLAN:  Principal Problem:   Weakness -vision has significant anorexia due to symptoms from her duodenal ulcers, she likely has some nutritional deficiencies leading to her weakness.  She also had acutely hypokalemia and hypomagnesemia today.  We will admit her to correct her electrolytes and get a GI consult as below. Active Problems:   Hypokalemia -replace potassium and monitor   Hypomagnesemia -replace magnesium and monitor   Multiple duodenal ulcers -patient has been following with GI for this, GI consult for continued management   HTN (hypertension) -continue home meds   HLD (hyperlipidemia) -continue home meds  All the records are reviewed and case discussed with ED provider. Management plans discussed with the patient and/or family.  DVT PROPHYLAXIS: SubQ lovenox  GI PROPHYLAXIS: PPI  ADMISSION STATUS: Inpatient  CODE STATUS: Full Code Status History    This patient does not have a recorded code status. Please follow your organizational policy for patients in this situation.    Advance Directive Documentation     Most Recent Value  Type of Advance Directive  Living will  Pre-existing out of facility DNR order (yellow form or pink MOST form)  No data  "MOST" Form in Place?  No data      TOTAL TIME TAKING CARE OF THIS PATIENT: 45 minutes.   Ozie Lupe [place] 05/16/2017, 10:37 PM  [facility]  Office  [phone]  CC: Primary care physician; Kirk Ruths, MD  Note:  This document was prepared using Dragon voice recognition software and may include unintentional dictation errors.

## 2017-05-16 NOTE — ED Notes (Signed)
Pt assisted to toilet to void 

## 2017-05-16 NOTE — ED Provider Notes (Signed)
[facility] Emergency Department Provider Note  ____________________________________________  Time seen: Approximately 8:06 PM  I have reviewed the triage vital signs and the nursing notes.   HISTORY  Chief Complaint Weakness    HPI Jenna Carlson is a 66 y.o. female with a history of chronic duodenal ulcers and poor oral intake over the past 6 months with 70 point weight loss who complains of worsening generalized weakness. Today it became so severe that she could not go up steps  into her home. Her mobility and ADLs are severely limited by her weakness. Denies any black or bloody stool. No vomiting. Just has no appetite.  Denies any paresthesias or peripheral weakness. Denies any loss of balance or coordination.  no falls or trauma   Past Medical History:  Diagnosis Date  . Allergic rhinitis   . Asthma   . Cancer (HCC)    BREAST  . Depression   . Edema   . GERD (gastroesophageal reflux disease)   . Hyperlipidemia   . Hypertension   . Osteoporosis, postmenopausal      There are no active problems to display for this patient.    Past Surgical History:  Procedure Laterality Date  . COLONOSCOPY WITH PROPOFOL N/A 02/16/2017   Procedure: COLONOSCOPY WITH PROPOFOL;  Surgeon: Manya Silvas, MD;  Location: [facility] ENDOSCOPY;  Service: Endoscopy;  Laterality: N/A;  . ESOPHAGOGASTRODUODENOSCOPY (EGD) WITH PROPOFOL N/A 02/16/2017   Procedure: ESOPHAGOGASTRODUODENOSCOPY (EGD) WITH PROPOFOL;  Surgeon: Manya Silvas, MD;  Location: [facility] ENDOSCOPY;  Service: Endoscopy;  Laterality: N/A;  . MASTECTOMY    . WRIST SURGERY Left      Prior to Admission medications   Medication Sig Start Date End Date Taking? Authorizing Provider  azelastine (ASTELIN) 0.1 % nasal spray Place 1 spray into both nostrils 2 (two) times daily. Use in each nostril as directed    [provider]  carvedilol (COREG) 25 MG tablet Take 25 mg by mouth 2 (two) times daily  with a meal.    [provider]  cetirizine (ZYRTEC) 10 MG tablet Take 10 mg by mouth daily.    [provider]  EPINEPHrine 0.3 mg/0.3 mL IJ SOAJ injection Inject into the muscle once.    [provider]  ergocalciferol (VITAMIN D2) 50000 units capsule Take 50,000 Units by mouth once a week.    [provider]  fluticasone furoate-vilanterol (BREO ELLIPTA) 100-25 MCG/INH AEPB Inhale 1 puff into the lungs daily.    [provider]  montelukast (SINGULAIR) 10 MG tablet Take 10 mg by mouth at bedtime.    [provider]  ondansetron (ZOFRAN ODT) 4 MG disintegrating tablet Take 1 tablet (4 mg total) by mouth every 8 (eight) hours as needed for nausea or vomiting. 01/23/17   Harvest Dark, MD  potassium chloride 20 MEQ TBCR Take 20 mEq by mouth 2 (two) times daily. 03/18/17   Arta Silence, MD  ranitidine (ZANTAC) 150 MG capsule Take 150 mg by mouth 2 (two) times daily.    [provider]  sertraline (ZOLOFT) 50 MG tablet Take 50 mg by mouth daily.    [provider]  simvastatin (ZOCOR) 40 MG tablet Take 40 mg by mouth daily.    [provider]  torsemide (DEMADEX) 20 MG tablet Take 20 mg by mouth daily.    [provider]  trandolapril-verapamil (TARKA) 4-240 MG tablet Take 1 tablet by mouth daily.    [provider]  Allergies Aspirin; Codeine; Hydrochlorothiazide; and Morphine and related   No family history on file.  Social History Social History   Tobacco Use  . Smoking status: Never Smoker  . Smokeless tobacco: Never Used  Substance Use Topics  . Alcohol use: Yes    Comment: occasional wine  . Drug use: No    Review of Systems  Constitutional:   No fever or chills.  ENT:   No sore throat. No rhinorrhea. Cardiovascular:   No chest pain or syncope. Respiratory:   No dyspnea or cough. Gastrointestinal:   Negative for abdominal pain, vomiting and diarrhea.   Musculoskeletal:   Negative for focal pain or swelling All other systems reviewed and are negative except as documented above in ROS and HPI.  ____________________________________________   PHYSICAL EXAM:  VITAL SIGNS: ED Triage Vitals  Enc Vitals Group     BP 05/16/17 1750 (!) 156/83     Pulse Rate 05/16/17 1750 68     Resp 05/16/17 1750 11     Temp 05/16/17 1750 98 F (36.7 C)     Temp Source 05/16/17 1750 Oral     SpO2 05/16/17 1750 100 %     Weight 05/16/17 1738 156 lb (70.8 kg)     Height 05/16/17 1738 5\' 6"  (1.676 m)     Head Circumference --      Peak Flow --      Pain Score 05/16/17 1738 0     Pain Loc --      Pain Edu? --      Excl. in [place]? --     Vital signs reviewed, nursing assessments reviewed.   Constitutional:   Alert and oriented. Well appearing and in no distress. Eyes:   No scleral icterus.  EOMI. No nystagmus. No conjunctival pallor. PERRL. ENT   Head:   Normocephalic and atraumatic.   Nose:   No congestion/rhinnorhea.    Mouth/Throat:   MMM, no pharyngeal erythema. No peritonsillar mass.    Neck:   No meningismus. Full ROM. Hematological/Lymphatic/Immunilogical:   No cervical lymphadenopathy. Cardiovascular:   RRR. Symmetric bilateral radial and DP pulses.  No murmurs.  Respiratory:   Normal respiratory effort without tachypnea/retractions. Breath sounds are clear and equal bilaterally. No wheezes/rales/rhonchi. Gastrointestinal:   Soft and nontender. Non distended. There is no CVA tenderness.  No rebound, rigidity, or guarding. Genitourinary:   deferred Musculoskeletal:   Normal range of motion in all extremities. No joint effusions.  No lower extremity tenderness.  No edema. Neurologic:   Normal speech and language.  Motor grossly intact. No gross focal neurologic deficits are appreciated.  Skin:    Skin is warm, dry and intact. No rash noted.  No petechiae, purpura, or bullae.  ____________________________________________     LABS (pertinent positives/negatives) (all labs ordered are listed, but only abnormal results are displayed) Labs Reviewed  BASIC METABOLIC PANEL - Abnormal; Notable for the following components:      Result Value   Potassium 2.2 (*)    CO2 20 (*)    Glucose, Bld 108 (*)    Calcium 8.0 (*)    All other components within normal limits  CBC - Abnormal; Notable for the following components:   RBC 3.71 (*)    RDW 14.7 (*)    All other components within normal limits  URINALYSIS, COMPLETE (UACMP) WITH MICROSCOPIC - Abnormal; Notable for the following components:   Color, Urine STRAW (*)    APPearance CLEAR (*)    Specific Gravity,  Urine 1.001 (*)    Ketones, ur 5 (*)    Leukocytes, UA TRACE (*)    Squamous Epithelial / LPF 0-5 (*)    All other components within normal limits  CBG MONITORING, ED   ____________________________________________   EKG  Interpreted by me Sinus rhythm rate of 66, normal axis and intervals. Normal QRS ST segments and T waves. There are U waves present in the precordial leads  ____________________________________________    RADIOLOGY  No results found.  ____________________________________________   PROCEDURES Procedures  ____________________________________________  CLINICAL IMPRESSION / ASSESSMENT AND PLAN / ED COURSE  Pertinent labs & imaging results that were available during my care of the patient were reviewed by me and considered in my medical decision making (see chart for details).   patient not in distress but with severe generalized weakness. On labs, she has hypokalemia at 2.2. Other labs are unremarkable although she does appear to be dehydrated. Patient given IV fluids and potassium replacement. However, with the severity of her weakness and hypokalemia and chronic failure to thrive related to her ulcer disease, I believe the patient will need to be hospitalized for electrolyte stabilization.       ----------------------------------------- 9:42 PM on 05/16/2017 -----------------------------------------  Patient reassessed, feels slightly better but still severely weak, very unsteady on her feet with limited ability to perform her ADLs in the setting of this acute illness. Had difficulty tolerating a second dose of oral potassium. Does have some EKG changes due to the effects of hypokalemia. All discussed with hospitalist for further management.  Patient also notes that her gastroenterologist was planning to get an MRA of her abdomen to evaluate for chronic mesenteric ischemia. This can be considered by the admitting team..  Review of the electronic medical record shows that her thyroid studies are normal, she does have a significantly elevated gastrin level raising suspicion of Zollinger-Ellison syndrome.   ____________________________________________   FINAL CLINICAL IMPRESSION(S) / ED DIAGNOSES    Final diagnoses:  Generalized weakness  Hypokalemia      This SmartLink is deprecated. Use AVSMEDLIST instead to display the medication list for a patient.   Portions of this note were generated with dragon dictation software. Dictation errors may occur despite best attempts at proofreading.    Carrie Mew, MD 05/16/17 2148

## 2017-05-16 NOTE — ED Notes (Signed)
Dr Joni Fears notified of critical K+ - see new orders

## 2017-05-16 NOTE — ED Triage Notes (Signed)
Pt arrived via ems for c/o of weakness that has increased over the last 6 months - she started being sick in June and was then dx with ulcers in her small intestines that have decreased her appetite and lead to weakness - pt went to PCP yesterday and then called and told her PCP today that she was having difficulty walking up the steps at her home and the PCP advised her to come to the ER for eval

## 2017-05-16 NOTE — ED Notes (Signed)
Pt given sprite 

## 2017-05-17 ENCOUNTER — Other Ambulatory Visit: Payer: Self-pay

## 2017-05-17 ENCOUNTER — Inpatient Hospital Stay: Payer: Medicare Other

## 2017-05-17 DIAGNOSIS — K529 Noninfective gastroenteritis and colitis, unspecified: Secondary | ICD-10-CM

## 2017-05-17 DIAGNOSIS — R197 Diarrhea, unspecified: Secondary | ICD-10-CM

## 2017-05-17 DIAGNOSIS — K2981 Duodenitis with bleeding: Secondary | ICD-10-CM

## 2017-05-17 DIAGNOSIS — R531 Weakness: Secondary | ICD-10-CM

## 2017-05-17 LAB — COMPREHENSIVE METABOLIC PANEL
ALT: 12 U/L — ABNORMAL LOW (ref 14–54)
ANION GAP: 11 (ref 5–15)
AST: 25 U/L (ref 15–41)
Albumin: 2.3 g/dL — ABNORMAL LOW (ref 3.5–5.0)
Alkaline Phosphatase: 54 U/L (ref 38–126)
BUN: 5 mg/dL — ABNORMAL LOW (ref 6–20)
CALCIUM: 7.8 mg/dL — AB (ref 8.9–10.3)
CHLORIDE: 108 mmol/L (ref 101–111)
CO2: 19 mmol/L — AB (ref 22–32)
Creatinine, Ser: 0.72 mg/dL (ref 0.44–1.00)
GFR calc non Af Amer: >60 mL/min (ref >60)
Glucose, Bld: 73 mg/dL (ref 65–99)
Potassium: 3.4 mmol/L — ABNORMAL LOW (ref 3.5–5.1)
SODIUM: 138 mmol/L (ref 135–145)
Total Bilirubin: 1.2 mg/dL (ref 0.3–1.2)
Total Protein: 5.1 g/dL — ABNORMAL LOW (ref 6.5–8.1)

## 2017-05-17 LAB — CBC
HCT: 34 % — ABNORMAL LOW (ref 35.0–47.0)
HEMOGLOBIN: 11.2 g/dL — AB (ref 12.0–16.0)
MCH: 33.1 pg (ref 26.0–34.0)
MCHC: 33 g/dL (ref 32.0–36.0)
MCV: 100.2 fL — AB (ref 80.0–100.0)
Platelets: 251 10*3/uL (ref 150–440)
RBC: 3.39 MIL/uL — AB (ref 3.80–5.20)
RDW: 15 % — ABNORMAL HIGH (ref 11.5–14.5)
WBC: 10 10*3/uL (ref 3.6–11.0)

## 2017-05-17 LAB — MAGNESIUM
MAGNESIUM: 1.4 mg/dL — AB (ref 1.7–2.4)
Magnesium: 2.1 mg/dL (ref 1.7–2.4)

## 2017-05-17 LAB — PHOSPHORUS: PHOSPHORUS: 2.5 mg/dL (ref 2.5–4.6)

## 2017-05-17 MED ORDER — CARVEDILOL 25 MG PO TABS
25.0000 mg | ORAL_TABLET | Freq: Two times a day (BID) | ORAL | Status: DC
  Administered 2017-05-17 – 2017-05-20 (×6): 25 mg via ORAL
  Filled 2017-05-17 (×6): qty 1

## 2017-05-17 MED ORDER — MONTELUKAST SODIUM 10 MG PO TABS
10.0000 mg | ORAL_TABLET | Freq: Every day | ORAL | Status: DC
  Administered 2017-05-17 – 2017-05-21 (×6): 10 mg via ORAL
  Filled 2017-05-17 (×6): qty 1

## 2017-05-17 MED ORDER — POTASSIUM CHLORIDE CRYS ER 20 MEQ PO TBCR
20.0000 meq | EXTENDED_RELEASE_TABLET | Freq: Two times a day (BID) | ORAL | Status: DC
  Administered 2017-05-17 (×3): 20 meq via ORAL
  Filled 2017-05-17 (×3): qty 1

## 2017-05-17 MED ORDER — TRANDOLAPRIL 4 MG PO TABS
4.0000 mg | ORAL_TABLET | Freq: Every day | ORAL | Status: DC
  Filled 2017-05-17 (×2): qty 1

## 2017-05-17 MED ORDER — SODIUM CHLORIDE 0.9 % IV SOLN
INTRAVENOUS | Status: AC
  Administered 2017-05-17: 01:00:00 via INTRAVENOUS

## 2017-05-17 MED ORDER — POTASSIUM CHLORIDE 10 MEQ/100ML IV SOLN
10.0000 meq | Freq: Once | INTRAVENOUS | Status: AC
  Administered 2017-05-17: 07:00:00 10 meq via INTRAVENOUS

## 2017-05-17 MED ORDER — SERTRALINE HCL 50 MG PO TABS
50.0000 mg | ORAL_TABLET | Freq: Every day | ORAL | Status: DC
  Administered 2017-05-17 – 2017-05-22 (×6): 50 mg via ORAL
  Filled 2017-05-17 (×6): qty 1

## 2017-05-17 MED ORDER — SIMVASTATIN 20 MG PO TABS
40.0000 mg | ORAL_TABLET | Freq: Every day | ORAL | Status: DC
  Administered 2017-05-17 – 2017-05-21 (×5): 40 mg via ORAL
  Filled 2017-05-17 (×5): qty 2

## 2017-05-17 MED ORDER — ADULT MULTIVITAMIN W/MINERALS CH
1.0000 | ORAL_TABLET | Freq: Every day | ORAL | Status: DC
  Administered 2017-05-18 – 2017-05-22 (×5): 1 via ORAL
  Filled 2017-05-17 (×5): qty 1

## 2017-05-17 MED ORDER — TORSEMIDE 20 MG PO TABS
20.0000 mg | ORAL_TABLET | Freq: Every day | ORAL | Status: DC
  Administered 2017-05-17: 08:00:00 20 mg via ORAL
  Filled 2017-05-17: qty 1

## 2017-05-17 MED ORDER — GADOBENATE DIMEGLUMINE 529 MG/ML IV SOLN
15.0000 mL | Freq: Once | INTRAVENOUS | Status: AC | PRN
  Administered 2017-05-17: 14 mL via INTRAVENOUS

## 2017-05-17 MED ORDER — VERAPAMIL HCL ER 240 MG PO TBCR
240.0000 mg | EXTENDED_RELEASE_TABLET | Freq: Every day | ORAL | Status: DC
  Administered 2017-05-17 – 2017-05-22 (×6): 240 mg via ORAL
  Filled 2017-05-17 (×7): qty 1

## 2017-05-17 MED ORDER — PROMETHAZINE HCL 25 MG/ML IJ SOLN: 12.5000 mg | Freq: Four times a day (QID) | INTRAMUSCULAR | Status: DC | PRN

## 2017-05-17 MED ORDER — ACETAMINOPHEN 325 MG PO TABS
650.0000 mg | ORAL_TABLET | Freq: Four times a day (QID) | ORAL | Status: DC | PRN
  Administered 2017-05-17 – 2017-05-21 (×4): 650 mg via ORAL
  Filled 2017-05-17 (×4): qty 2

## 2017-05-17 MED ORDER — PANTOPRAZOLE SODIUM 40 MG PO TBEC
40.0000 mg | DELAYED_RELEASE_TABLET | Freq: Two times a day (BID) | ORAL | Status: DC
  Administered 2017-05-17 – 2017-05-22 (×12): 40 mg via ORAL
  Filled 2017-05-17 (×12): qty 1

## 2017-05-17 MED ORDER — TRANDOLAPRIL-VERAPAMIL HCL ER 4-240 MG PO TBCR: 1.0000 | EXTENDED_RELEASE_TABLET | Freq: Every day | ORAL | Status: DC

## 2017-05-17 MED ORDER — FLUTICASONE FUROATE-VILANTEROL 100-25 MCG/INH IN AEPB
1.0000 | INHALATION_SPRAY | Freq: Every day | RESPIRATORY_TRACT | Status: DC
  Administered 2017-05-17 – 2017-05-22 (×6): 1 via RESPIRATORY_TRACT
  Filled 2017-05-17: qty 28

## 2017-05-17 MED ORDER — ENSURE ENLIVE PO LIQD
237.0000 mL | Freq: Three times a day (TID) | ORAL | Status: DC
  Administered 2017-05-17: 19:00:00 237 mL via ORAL

## 2017-05-17 MED ORDER — ACETAMINOPHEN 650 MG RE SUPP: 650.0000 mg | Freq: Four times a day (QID) | RECTAL | Status: DC | PRN

## 2017-05-17 MED ORDER — ENOXAPARIN SODIUM 40 MG/0.4ML ~~LOC~~ SOLN
40.0000 mg | SUBCUTANEOUS | Status: DC
  Administered 2017-05-17 – 2017-05-21 (×5): 40 mg via SUBCUTANEOUS
  Filled 2017-05-17 (×5): qty 0.4

## 2017-05-17 MED ORDER — PROMETHAZINE HCL 12.5 MG PO TABS: 12.5000 mg | ORAL_TABLET | Freq: Four times a day (QID) | ORAL | Status: DC | PRN

## 2017-05-17 NOTE — Plan of Care (Signed)
  Progressing Education: Knowledge of General Education information will improve 05/17/2017 0430 - Progressing by Jeri Cos, RN

## 2017-05-17 NOTE — Consult Note (Signed)
Lucilla Lame, MD [address] [place], [place] [address] Phone: [phone] Fax : [phone]  Consultation  Referring Provider:     Dr. Benjie Karvonen Primary Care Physician:  Kirk Ruths, MD Primary Gastroenterologist:  Dr. Tiffany Kocher         Reason for Consultation:     Elevated gastrin level and weight loss with diarrhea  Date of Admission:  05/16/2017 Date of Consultation:  05/17/2017         HPI:   Jenna Carlson is a 66 y.o. female who comes in with a report of feeling weak and states that she has been suffering with weight loss diarrhea and weakness. The patient reports that she has been having the symptoms since June with her being told she may have an infection causing her diarrhea back in June but then was referred to gastroenterology in July.  The patient underwent an upper endoscopy and was found to have duodenal ulcers.  Over the course of the last 5 months it appears that the patient has only had the upper endoscopy and a gastrin level sent off.  The gastrin level was slightly elevated but not 10 times the upper limit of normal.  The patient was then set up for an MRI which she had not had.  The patient now comes to the hospital with weakness and diarrhea and found to have electrolyte abnormalities.  I'm now being asked to see this patient for the above symptoms.  Past Medical History:  Diagnosis Date  . Allergic rhinitis   . Asthma   . Cancer (HCC)    BREAST  . Depression   . Edema   . GERD (gastroesophageal reflux disease)   . Hyperlipidemia   . Hypertension   . Osteoporosis, postmenopausal     Past Surgical History:  Procedure Laterality Date  . COLONOSCOPY WITH PROPOFOL N/A 02/16/2017   Procedure: COLONOSCOPY WITH PROPOFOL;  Surgeon: Manya Silvas, MD;  Location: [facility] ENDOSCOPY;  Service: Endoscopy;  Laterality: N/A;  . ESOPHAGOGASTRODUODENOSCOPY (EGD) WITH PROPOFOL N/A 02/16/2017   Procedure: ESOPHAGOGASTRODUODENOSCOPY (EGD) WITH PROPOFOL;   Surgeon: Manya Silvas, MD;  Location: [facility] ENDOSCOPY;  Service: Endoscopy;  Laterality: N/A;  . MASTECTOMY    . WRIST SURGERY Left     Prior to Admission medications   Medication Sig Start Date End Date Taking? Authorizing Provider  azelastine (ASTELIN) 0.1 % nasal spray Place 1 spray into both nostrils 2 (two) times daily. Use in each nostril as directed   Yes [provider]  carvedilol (COREG) 25 MG tablet Take 25 mg by mouth 2 (two) times daily with a meal.   Yes [provider]  cetirizine (ZYRTEC) 10 MG tablet Take 10 mg by mouth daily.   Yes [provider]  EPINEPHrine 0.3 mg/0.3 mL IJ SOAJ injection Inject into the muscle once.   Yes [provider]  ergocalciferol (VITAMIN D2) 50000 units capsule Take 50,000 Units by mouth once a week.   Yes [provider]  fluticasone furoate-vilanterol (BREO ELLIPTA) 100-25 MCG/INH AEPB Inhale 1 puff into the lungs daily.   Yes [provider]  montelukast (SINGULAIR) 10 MG tablet Take 10 mg by mouth at bedtime.   Yes [provider]  omeprazole (PRILOSEC) 40 MG capsule Take 40 mg by mouth 2 (two) times daily. 03/09/17 03/09/18 Yes [provider]  potassium chloride 20 MEQ TBCR Take 20 mEq by mouth 2 (two) times daily. 03/18/17  Yes Arta Silence,  MD  promethazine (PHENERGAN) 12.5 MG tablet Take 1 tablet by mouth every 6 (six) hours as needed. 05/09/17 05/16/17 Yes [provider]  sertraline (ZOLOFT) 50 MG tablet Take 50 mg by mouth daily.   Yes [provider]  simvastatin (ZOCOR) 40 MG tablet Take 40 mg by mouth daily.   Yes [provider]  ondansetron (ZOFRAN ODT) 4 MG disintegrating tablet Take 1 tablet (4 mg total) by mouth every 8 (eight) hours as needed for nausea or vomiting. 01/23/17   Harvest Dark, MD  ranitidine (ZANTAC) 150 MG capsule Take 150 mg by mouth 2 (two) times daily.    [provider]  torsemide (DEMADEX)  20 MG tablet Take 20 mg by mouth daily.    [provider]  trandolapril-verapamil (TARKA) 4-240 MG tablet Take 1 tablet by mouth daily.    [provider]    Family History  Problem Relation Age of Onset  . Heart attack Father   . Hypertension Other      Social History   Tobacco Use  . Smoking status: Never Smoker  . Smokeless tobacco: Never Used  Substance Use Topics  . Alcohol use: Yes    Comment: occasional wine  . Drug use: No    Allergies as of 05/16/2017 - Review Complete 05/16/2017  Allergen Reaction Noted  . Iodinated diagnostic agents Shortness Of Breath 01/17/2017  . Aspirin Tinitus 01/23/2017  . Codeine  01/23/2017  . Eggs or egg-derived products Other (See Comments) 04/03/2017  . Hydrochlorothiazide Other (See Comments) 02/15/2017  . Morphine and related Nausea And Vomiting 01/23/2017    Review of Systems:    All systems reviewed and negative except where noted in HPI.   Physical Exam:  Vital signs in last 24 hours: Temp:  [98 F (36.7 C)-98.3 F (36.8 C)] 98.3 F (36.8 C) (11/29 1424) Pulse Rate:  [66-80] 76 (11/29 1424) Resp:  [11-23] 16 (11/29 0524) BP: (92-159)/(61-101) 92/61 (11/29 1424) SpO2:  [98 %-100 %] 100 % (11/29 1424) Weight:  [156 lb (70.8 kg)-161 lb 9.6 oz (73.3 kg)] 161 lb 9.6 oz (73.3 kg) (11/29 1524)   General:   Pleasant, cooperative in NAD Head:  Normocephalic and atraumatic. Eyes:   No icterus.   Conjunctiva pink. PERRLA. Ears:  Normal auditory acuity. Neck:  Supple; no masses or thyroidomegaly Lungs: Respirations even and unlabored. Lungs clear to auscultation bilaterally.   No wheezes, crackles, or rhonchi.  Heart:  Regular rate and rhythm;  Without murmur, clicks, rubs or gallops Abdomen:  Soft, nondistended, nontender. Normal bowel sounds. No appreciable masses or hepatomegaly.  No rebound or guarding.  Rectal:  Not performed. Msk:  Symmetrical without gross deformities.    Extremities:  Without edema,  cyanosis or clubbing. Neurologic:  Alert and oriented x3;  grossly normal neurologically. Skin:  Intact without significant lesions or rashes. Cervical Nodes:  No significant cervical adenopathy. Psych:  Alert and cooperative. Normal affect.  LAB RESULTS: Recent Labs    05/16/17 1750 05/17/17 0424  WBC 10.0 10.0  HGB 12.4 11.2*  HCT 36.7 34.0*  PLT 274 251   BMET Recent Labs    05/16/17 1750 05/17/17 0424  NA 135 138  K 2.2* 3.4*  CL 101 108  CO2 20* 19*  GLUCOSE 108* 73  BUN 7 5*  CREATININE 0.74 0.72  CALCIUM 8.0* 7.8*   LFT Recent Labs    05/17/17 0424  PROT 5.1*  ALBUMIN 2.3*  AST 25  ALT 12*  ALKPHOS  76  BILITOT 1.2   PT/INR No results for input(s): LABPROT, INR in the last 72 hours.  STUDIES: Mr Abdomen W Wo Contrast  Result Date: 05/17/2017 CLINICAL DATA:  Inpatient. Generalized weakness, anorexia and 70 pound weight loss over the prior several months. History of duodenal ulcer. 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 EXAM: MRI ABDOMEN WITHOUT AND WITH CONTRAST TECHNIQUE: Multiplanar multisequence MR imaging of the abdomen was performed both before and after the administration of intravenous contrast. CONTRAST:  39mL MULTIHANCE GADOBENATE DIMEGLUMINE 529 MG/ML IV SOLN COMPARISON:  01/23/2017 right upper quadrant abdominal sonogram and CT abdomen/pelvis. FINDINGS: Lower chest: No acute abnormality at the lung bases. Hepatobiliary: Normal liver size and configuration. No hepatic steatosis. Subcentimeter simple segment 8 right liver lobe cyst. No liver masses. Normal gallbladder with no cholelithiasis. No biliary ductal dilatation. Common bile duct diameter 3 mm. No choledocholithiasis. Pancreas: There is a 0.4 cm focus of arterial phase hyperenhancement at the pancreaticoduodenal groove (series 16/ image 46) just above the ampulla. Otherwise no pancreatic mass. No pancreatic duct dilation. Pancreatic duct is diminutive and not well visualized, limiting assessment for  pancreas divisum. Spleen: Normal size. No mass. Adrenals/Urinary Tract: Normal adrenals. No hydronephrosis. Normal kidneys with no renal mass. Stomach/Bowel: Grossly normal stomach. Visualized small and large bowel is normal caliber, with no bowel wall thickening. Vascular/Lymphatic: Normal caliber abdominal aorta. Patent portal, splenic, hepatic and renal veins. No pathologically enlarged lymph nodes in the abdomen. Other: No abdominal ascites or focal fluid collection. Limited visualization of a 3.2 cm right adnexal cyst (series 2/ image 10), stable in size since 01/23/2017 CT study. Musculoskeletal: No aggressive appearing focal osseous lesions. IMPRESSION: 1. Nonspecific tiny 0.4 cm focus of arterial phase hyperenhancement at the pancreaticoduodenal groove just above the ampulla. Tiny gastrinoma not excluded. Consider correlation with Octreoscan. 2. Otherwise no discrete pancreatic or duodenal mass. No abdominal lymphadenopathy. No suspicious liver masses. 3. Limited visualization of a 3.2 cm right adnexal cyst, for which 3 month size stability has been demonstrated, suggesting a benign cyst. Consider attention on a follow-up pelvic ultrasound study in 6-12 months. Electronically Signed   By: Ilona Sorrel M.D.   On: 05/17/2017 15:08      Impression / Plan:   Jenna Carlson is a 66 y.o. y/o female with a history of elevated gastrin, duodenal ulcers and diarrhea.  The patient had gastrin levels only double the upper limit of normal.  She had a MRI today that showed her to have a small lesion at the level of the ampulla between the pancreas and the duodenum.  This was concerning for a possible gastrinoma.  The patient should be set up for an octreoscan as was recommended by the MRI.  I believe that involving oncology in this patient's workup may be beneficial.  No further endoscopic procedures are needed at this time.  The patient should have the Octreoscan and if positive be referred to a tertiary care  center for surgery and if negative consider an EUS and further follow-up by Dr. Vira Agar as an outpatient.  Thank you for involving me in the care of this patient.      LOS: 1 day   Lucilla Lame, MD  05/17/2017, 5:36 PM   Note: This dictation was prepared with Dragon dictation along with smaller phrase technology. Any transcriptional errors that result from this process are unintentional.

## 2017-05-17 NOTE — Consult Note (Signed)
PHARMACY CONSULT NOTE - INITIAL   Pharmacy Consult for Electrolyte Management   Patient Measurements: Height: 5\' 6"  (167.6 cm) Weight: 156 lb (70.8 kg) IBW/kg (Calculated) : 59.3 Intake/Output from previous day: 11/28 0701 - 11/29 0700 In: 1226.3 [I.V.:126.3; IV Piggyback:1100] Out: -  Intake/Output from this shift: No intake/output data recorded.  Potassium (mmol/L)  Date Value  05/17/2017 3.4 (L)   Magnesium (mg/dL)  Date Value  05/17/2017 1.4 (L)   Calcium (mg/dL)  Date Value  05/17/2017 7.8 (L)   Albumin (g/dL)  Date Value  05/17/2017 2.3 (L)  ] Labs: Estimated Creatinine Clearance: 63 mL/min (by C-G formula based on SCr of 0.72 mg/dL).   Assessment: Pharmacy consulted for electrolyte management in 66 yo female admitted with progressive weakness.   Goal of Therapy:  Electrolytes WNL  Plan:  K+: 3.4  Mg: 1.4- Patient received Mg Sulfate IV 4g x 1 dose this morning and KCl 43mEq PO and KCL 53mEq IV x 1 dose. Will NOT order any additional replacement at this time.   Will recheck Magnesium at 1800, and all electrolytes with am labs.   Pernell Dupre, PharmD, BCPS Clinical Pharmacist 05/17/2017 9:25 AM

## 2017-05-17 NOTE — Progress Notes (Signed)
Pt did not receive 4th bag of potassium. EMAR stated had expired. Notified pharmacy, per Nate to reorder. Order placed. Will contiue to monitor and asses.

## 2017-05-17 NOTE — Progress Notes (Signed)
[facility] NAME: Jenna Carlson    MR#:  [id number]  DATE OF BIRTH:  02-19-1951  SUBJECTIVE:   Patient has been evaluated by GI over several months due to duodenal ulcer. Since June she has had weight loss and decreased appetite along with diarrhea. She is on PPI and Zantac. She presents with generalized weakness and found to have electrolyte abnormalities.  REVIEW OF SYSTEMS:    Review of Systems  Constitutional: Negative for fever, chills++  weight loss and generalized weakness HENT: Negative for ear pain, nosebleeds, congestion, facial swelling, rhinorrhea, neck pain, neck stiffness and ear discharge.   Respiratory: Negative for cough, shortness of breath, wheezing  Cardiovascular: Negative for chest pain, palpitations and leg swelling.  Gastrointestinal: Negative for heartburn, abdominal pain, vomiting, diarrhea or consitpation Genitourinary: Negative for dysuria, urgency, frequency, hematuria Musculoskeletal: Negative for back pain or joint pain Neurological: Negative for dizziness, seizures, syncope, focal weakness,  numbness and headaches.  Hematological: Does not bruise/bleed easily.  Psychiatric/Behavioral: Negative for hallucinations, confusion, dysphoric mood    Tolerating Diet: yes      DRUG ALLERGIES:   Allergies  Allergen Reactions  . Iodinated Diagnostic Agents Shortness Of Breath  . Aspirin Tinitus  . Codeine   . Eggs Or Egg-Derived Products Other (See Comments)    On testing  . Hydrochlorothiazide Other (See Comments)  . Morphine And Related Nausea And Vomiting    VITALS:  Blood pressure (!) 137/97, pulse 76, temperature 98.2 F (36.8 C), temperature source Oral, resp. rate 16, height 5\' 6"  (1.676 m), weight 70.8 kg (156 lb), SpO2 98 %.  PHYSICAL EXAMINATION:  Constitutional: Appears well-developed and well-nourished. No distress. HENT: Normocephalic. Marland Kitchen Oropharynx is clear and moist.  Eyes:  Conjunctivae and EOM are normal. PERRLA, no scleral icterus.  Neck: Normal ROM. Neck supple. No JVD. No tracheal deviation. CVS: RRR, S1/S2 +, no murmurs, no gallops, no carotid bruit.  Pulmonary: Effort and breath sounds normal, no stridor, rhonchi, wheezes, rales.  Abdominal: Soft. BS +,  no distension, tenderness, rebound or guarding.  Musculoskeletal: Normal range of motion. No edema and no tenderness.  Neuro: Alert. CN 2-12 grossly intact. No focal deficits. Skin: Skin is warm and dry. No rash noted. Psychiatric: Normal mood and affect.      LABORATORY PANEL:   CBC Recent Labs  Lab 05/17/17 0424  WBC 10.0  HGB 11.2*  HCT 34.0*  PLT 251   ------------------------------------------------------------------------------------------------------------------  Chemistries  Recent Labs  Lab 05/17/17 0424  NA 138  K 3.4*  CL 108  CO2 19*  GLUCOSE 73  BUN 5*  CREATININE 0.72  CALCIUM 7.8*  MG 1.4*  AST 25  ALT 12*  ALKPHOS 54  BILITOT 1.2   ------------------------------------------------------------------------------------------------------------------  Cardiac Enzymes No results for input(s): TROPONINI in the last 168 hours. ------------------------------------------------------------------------------------------------------------------  RADIOLOGY:  No results found.   ASSESSMENT AND PLAN:   66 year old female with a history of duodenal ulcer who presents to generalized weakness.   1. Generalized weakness due to electrolyte abnormalities which may be in part due to weight loss, poor nutrition and torsemide Physical therapy consultation Replace electrolytes Stop torsemide Dietitian consult  2. Hypokalemia and hypomagnesemia: These will be repleted and recheck in a.m. Stop torsemide 3. History of duodenal ulcer: Continue PPI and H2 blocker  4. Weight loss: I will order MRI of the abdomen to evaluate for underlying gastrinoma as had been planned by Dr  Tiffany Kocher.  5. Essential hypertension:  Continue TARKA and coreg  6. Hyperlipidemia: Continue Zocor  7. Depression: Continue Zoloft    Management plans discussed with the patient and she is in agreement.  CODE STATUS: full  TOTAL TIME TAKING CARE OF THIS PATIENT: 30 minutes.     POSSIBLE D/C 1-2 days, DEPENDING ON CLINICAL CONDITION.   Minela Bridgewater M.D on 05/17/2017 at 9:36 AM  Between 7am to 6pm - Pager - [phone] After 6pm go to [email or web address] - password [email or web address] [facility]  Office  [phone]  CC: Primary care physician; Kirk Ruths, MD  Note: This dictation was prepared with Dragon dictation along with smaller phrase technology. Any transcriptional errors that result from this process are unintentional.

## 2017-05-17 NOTE — Consult Note (Signed)
PHARMACY CONSULT NOTE - INITIAL   Pharmacy Consult for Electrolyte Management   Patient Measurements: Height: 5\' 6"  (167.6 cm) Weight: 161 lb 9.6 oz (73.3 kg)(bed scale) IBW/kg (Calculated) : 59.3 Intake/Output from previous day: 11/28 0701 - 11/29 0700 In: 1226.3 [I.V.:126.3; IV Piggyback:1100] Out: -  Intake/Output from this shift: Total I/O In: 240 [P.O.:240] Out: -   Potassium (mmol/L)  Date Value  05/17/2017 3.4 (L)   Magnesium (mg/dL)  Date Value  05/17/2017 2.1   Calcium (mg/dL)  Date Value  05/17/2017 7.8 (L)   Albumin (g/dL)  Date Value  05/17/2017 2.3 (L)   Phosphorus (mg/dL)  Date Value  05/17/2017 2.5  ] Labs: Estimated Creatinine Clearance: 69 mL/min (by C-G formula based on SCr of 0.72 mg/dL).   Assessment: Pharmacy consulted for electrolyte management in 66 yo female admitted with progressive weakness.   Goal of Therapy:  Electrolytes WNL  Plan:  Mg = 2.1 this evening, no additional supplementation needed. Electrolytes will be rechecked with AM labs tomorrow.  Lenis Noon, PharmD, BCPS Clinical Pharmacist 05/17/2017 7:12 PM

## 2017-05-17 NOTE — Progress Notes (Signed)
Initial Nutrition Assessment  DOCUMENTATION CODES:   Severe malnutrition in context of chronic illness  INTERVENTION:  Provide Ensure Enlive po TID, each supplement provides 350 kcal and 20 grams of protein. Patient prefers chocolate.  Monitor magnesium, potassium, and phosphorus daily for at least 3 days, MD to replete as needed, as pt is at risk for refeeding syndrome given severe malnutrition.  Encouraged small, frequent meals (4-6 daily) of calorie- and protein-dense foods.  Provide multivitamin with minerals daily.  NUTRITION DIAGNOSIS:   Severe Malnutrition related to chronic illness(ulcers, diarrhea, N/V) as evidenced by 27.8% weight loss over the past year, moderate fat depletion, severe fat depletion, moderate muscle depletion, severe muscle depletion.  GOAL:   Patient will meet greater than or equal to 90% of their needs  MONITOR:   PO intake, Supplement acceptance, Labs, Weight trends, I & O's  REASON FOR ASSESSMENT:   Malnutrition Screening Tool, Consult Assessment of nutrition requirement/status, Poor PO  ASSESSMENT:   66 year old female with PMHx of asthma, breast cancer s/p mastectomy, GERD, HTN, HLD, OP, depression who presented with progressive weakness, anorexia, chronic diarrhea and duodenal ulcers, weight loss admitted for work-up.   Met with patient at bedside. She reports she has not been eating much of anything the past few days. Only having crackers and applesauce. Prior to that she has been eating less than normal since June. She has been eating only two small meals per day, but is unable to report exact details of intake. She reports she has been having N/V, diarrhea, and ulcers, which have affected her intake. She has an egg allergy. Patient had about 50% of some mashed potatoes and beef after diet was advanced.  Reports UBW 223 lbs. Per review of weight history in chart patient was 223.9 lbs 06/06/2016, 211.2 lbs 12/04/2016, 193.2 lbs 01/15/2017. RD  obtained bed scale weight of 161.6 lbs today. She has lost 62.3 lbs (27.8% body weight) over the past year, which is significant for time frame.   Medications reviewed and include: pantoprazole, potassium chloride 20 mEq BID.  Labs reviewed: Potassium 3.4, CO2 19, BUN 5, Magnesium 1.4.  NUTRITION - FOCUSED PHYSICAL EXAM:    Most Recent Value  Orbital Region  Severe depletion  Upper Arm Region  Moderate depletion  Thoracic and Lumbar Region  Moderate depletion  Buccal Region  Severe depletion  Temple Region  Severe depletion  Clavicle Bone Region  Moderate depletion  Clavicle and Acromion Bone Region  Severe depletion  Scapular Bone Region  Moderate depletion  Dorsal Hand  Severe depletion  Patellar Region  Moderate depletion  Anterior Thigh Region  Moderate depletion  Posterior Calf Region  Moderate depletion  Edema (RD Assessment)  None  Hair  Reviewed  Eyes  Reviewed  Mouth  Reviewed  Skin  Reviewed  Nails  Reviewed     Diet Order:  Diet regular Room service appropriate? Yes; Fluid consistency: Thin  EDUCATION NEEDS:   Education needs have been addressed  Skin:  Skin Assessment: Reviewed RN Assessment(ecchymosis to bilateral arms)  Last BM:  05/17/2017 - medium type 7  Height:   Ht Readings from Last 1 Encounters:  05/16/17 _0  (1.676 m)    Weight:   Wt Readings from Last 1 Encounters:  05/17/17 161 lb 9.6 oz (73.3 kg)    Ideal Body Weight:  59.1 kg  BMI:  Body mass index is 26.08 kg/m.  Estimated Nutritional Needs:   Kcal:  1670-1930 (MSJ x 1.3-1.5)  Protein:  95-110 grams (1.3-1.5 grams/kg)  Fluid:  1.8-2.2 L/day (25-30 mL/kg)  Willey Blade, MS, RD, LDN Office: [phone] Pager: [phone] After Hours/Weekend Pager: [phone]

## 2017-05-17 NOTE — Progress Notes (Signed)
PT Cancellation Note  Patient Details Name: Jenna Carlson MRN: [id number] DOB: July 12, 1950   Cancelled Treatment:    Reason Eval/Treat Not Completed: Patient at procedure or test/unavailable(Consult received and chart reviewed. Patient currently off unit for diagnostic testing.  Will re-attempt at later time/date as medically appropriate and patient available.)  Homar Weinkauf H. Owens Shark, PT, DPT, NCS 05/17/17, 1:47 PM [phone]

## 2017-05-17 NOTE — Care Management Note (Signed)
Case Management Note  Patient Details  Name: Jenna Carlson MRN: [id number] Date of Birth: Mar 08, 1951  Subjective/Objective:                  Admitted to [facility] with the diagnosis of weakness. Lives alone. Sister is Teddy Spike. Last seen Dr. Ouida Sills about a month ago. Prescriptions are filled at [facility] in [place]. No home health. No skilled facility. No home oxygen. Raised toile seat in the home. Lives in a [place]. Self Feed, Self dress, self bath, but has to have someone close by for baths and dressing. Got dizzy and fell to the floor in September. Three fractured ribs and hurst right arm. Lost 70 pounds since September. Either sister or family will transport.  Action/Plan: Received referral for wanting information about [employer]. Information given. Encouraged to call this service   Expected Discharge Date:                  Expected Discharge Plan:     In-House Referral:   yes  Discharge planning Services     Post Acute Care Choice:    Choice offered to:     DME Arranged:    DME Agency:     HH Arranged:    HH Agency:     Status of Service:     If discussed at [employer] [facility] [employer], dates discussed:    Additional Comments:  Shelbie Ammons, RN MSN CCM Care Management [phone] 05/17/2017, 1:24 PM

## 2017-05-18 DIAGNOSIS — K269 Duodenal ulcer, unspecified as acute or chronic, without hemorrhage or perforation: Secondary | ICD-10-CM

## 2017-05-18 DIAGNOSIS — E878 Other disorders of electrolyte and fluid balance, not elsewhere classified: Secondary | ICD-10-CM

## 2017-05-18 DIAGNOSIS — R634 Abnormal weight loss: Secondary | ICD-10-CM

## 2017-05-18 DIAGNOSIS — Z79899 Other long term (current) drug therapy: Secondary | ICD-10-CM

## 2017-05-18 DIAGNOSIS — D379 Neoplasm of uncertain behavior of digestive organ, unspecified: Secondary | ICD-10-CM

## 2017-05-18 DIAGNOSIS — I10 Essential (primary) hypertension: Secondary | ICD-10-CM

## 2017-05-18 DIAGNOSIS — K219 Gastro-esophageal reflux disease without esophagitis: Secondary | ICD-10-CM

## 2017-05-18 DIAGNOSIS — E785 Hyperlipidemia, unspecified: Secondary | ICD-10-CM

## 2017-05-18 DIAGNOSIS — M810 Age-related osteoporosis without current pathological fracture: Secondary | ICD-10-CM

## 2017-05-18 LAB — CBC
HCT: 31.8 % — ABNORMAL LOW (ref 35.0–47.0)
Hemoglobin: 10.9 g/dL — ABNORMAL LOW (ref 12.0–16.0)
MCH: 33.9 pg (ref 26.0–34.0)
MCHC: 34.3 g/dL (ref 32.0–36.0)
MCV: 99.1 fL (ref 80.0–100.0)
PLATELETS: 212 10*3/uL (ref 150–440)
RBC: 3.21 MIL/uL — AB (ref 3.80–5.20)
RDW: 15 % — ABNORMAL HIGH (ref 11.5–14.5)
WBC: 7.7 10*3/uL (ref 3.6–11.0)

## 2017-05-18 LAB — BASIC METABOLIC PANEL
Anion gap: 9 (ref 5–15)
CALCIUM: 7.6 mg/dL — AB (ref 8.9–10.3)
CHLORIDE: 109 mmol/L (ref 101–111)
CO2: 22 mmol/L (ref 22–32)
CREATININE: 0.6 mg/dL (ref 0.44–1.00)
GFR calc non Af Amer: >60 mL/min (ref >60)
GLUCOSE: 79 mg/dL (ref 65–99)
Potassium: 2.9 mmol/L — ABNORMAL LOW (ref 3.5–5.1)
Sodium: 140 mmol/L (ref 135–145)

## 2017-05-18 LAB — C DIFFICILE QUICK SCREEN W PCR REFLEX
C DIFFICILE (CDIFF) TOXIN: NEGATIVE
C DIFFICLE (CDIFF) ANTIGEN: NEGATIVE
C Diff interpretation: NOT DETECTED

## 2017-05-18 LAB — MAGNESIUM
Magnesium: 2.1 mg/dL (ref 1.7–2.4)
Magnesium: 2.2 mg/dL (ref 1.7–2.4)

## 2017-05-18 LAB — POTASSIUM: Potassium: 4.3 mmol/L (ref 3.5–5.1)

## 2017-05-18 MED ORDER — POTASSIUM CHLORIDE CRYS ER 20 MEQ PO TBCR: 20.0000 meq | EXTENDED_RELEASE_TABLET | Freq: Once | ORAL | Status: DC

## 2017-05-18 MED ORDER — POTASSIUM CHLORIDE 10 MEQ/100ML IV SOLN
10.0000 meq | INTRAVENOUS | Status: AC
  Administered 2017-05-18 (×2): 10 meq via INTRAVENOUS
  Filled 2017-05-18 (×2): qty 100

## 2017-05-18 MED ORDER — POTASSIUM CHLORIDE CRYS ER 20 MEQ PO TBCR: 20.0000 meq | EXTENDED_RELEASE_TABLET | Freq: Two times a day (BID) | ORAL | Status: DC

## 2017-05-18 MED ORDER — POTASSIUM CHLORIDE 20 MEQ PO PACK
40.0000 meq | PACK | ORAL | Status: AC
  Administered 2017-05-18 (×2): 40 meq via ORAL
  Filled 2017-05-18 (×2): qty 2

## 2017-05-18 MED ORDER — POTASSIUM CHLORIDE CRYS ER 20 MEQ PO TBCR
20.0000 meq | EXTENDED_RELEASE_TABLET | Freq: Once | ORAL | Status: AC
  Administered 2017-05-18: 20 meq via ORAL
  Filled 2017-05-18: qty 1

## 2017-05-18 MED ORDER — POTASSIUM CHLORIDE CRYS ER 20 MEQ PO TBCR
20.0000 meq | EXTENDED_RELEASE_TABLET | Freq: Two times a day (BID) | ORAL | Status: DC
  Administered 2017-05-19 – 2017-05-22 (×7): 20 meq via ORAL
  Filled 2017-05-18 (×8): qty 1

## 2017-05-18 NOTE — Consult Note (Signed)
[facility]  Date of admission:  05/16/2017  Inpatient day:  05/18/2017  Consulting physician: Dr. Bettey Costa   Reason for Consultation:  Possible gastrinoma.  Chief Complaint: Jenna Carlson is a 66 y.o. female who was admitted through the emergency room with progressive weakness, chronic diarrhea, duodenal ulcers, weight loss, and electrolyte abnormalities.  HPI: The patient states that she was in her usual state of good health until 11/2016.  She describes eating bad rice at a restaurant. Since then she has had chronic diarrhea.  Diarrhea has been off and on.  Stool is described as putting like up to 3-4 times a day. Minimal she'll have one stool a day. She denies any blood or mucus. Appetite has been poor. She has lost 74 pounds since 09/2016 (20 pounds since 04/03/2017).  She has been followed in the GI Clinic at [facility] by Dr. Gaylyn Cheers.  GI panel, C diff, pancreatic elastase, and calprotectin were normal in 12/2016.  Abdomen and pelvic CT without contrast on 01/23/2017 was unremakable.    Colonoscopy on 02/16/2017 revealed small TA in ascending colon, sigmoid/descending diverticulosis.  EGD on 02/16/2017 revealed moderate Schatzki ring (dilated), gastritis, four non-bleeding crated and superficial duodenal ulcers within duodenal bulb.   She describes symptoms of nausea, reflux, diarrhea, and weight loss.  She denies any flushing.  She has been on omeprazole since 02/2017.  She is also on Carafate.  Gastrin level was 262 (0-115) on 05/16/2017.  TSH and free T4 were normal on 05/15/2017.  Abdomen MRI with and without contrast on 05/17/2017 revealed a nonspecific tiny 0.4 cm focus of arterial phase hyperenhancement at the pancreaticoduodenal groove just above the ampulla. Tiny gastrinoma was not excluded. Consider correlation with Octreoscan.  Otherwise, there was no discrete pancreatic or duodenal mass.  there was no abdominal lymphadenopathy.  There were no  suspicious liver masses.  She denies any family history of malignancy.   Past Medical History:  Diagnosis Date  . Allergic rhinitis   . Asthma   . Cancer (HCC)    BREAST  . Depression   . Edema   . GERD (gastroesophageal reflux disease)   . Hyperlipidemia   . Hypertension   . Osteoporosis, postmenopausal     Past Surgical History:  Procedure Laterality Date  . COLONOSCOPY WITH PROPOFOL N/A 02/16/2017   Procedure: COLONOSCOPY WITH PROPOFOL;  Surgeon: Manya Silvas, MD;  Location: [facility] ENDOSCOPY;  Service: Endoscopy;  Laterality: N/A;  . ESOPHAGOGASTRODUODENOSCOPY (EGD) WITH PROPOFOL N/A 02/16/2017   Procedure: ESOPHAGOGASTRODUODENOSCOPY (EGD) WITH PROPOFOL;  Surgeon: Manya Silvas, MD;  Location: [facility] ENDOSCOPY;  Service: Endoscopy;  Laterality: N/A;  . MASTECTOMY    . WRIST SURGERY Left     Family History  Problem Relation Age of Onset  . Heart attack Father   . Hypertension Other     Social History:  reports that  has never smoked. she has never used smokeless tobacco. She reports that she drinks alcohol. She reports that she does not use drugs.  She lives alone.  She is accompanied by a friend.  Allergies:  Allergies  Allergen Reactions  . Iodinated Diagnostic Agents Shortness Of Breath  . Aspirin Tinitus  . Codeine   . Eggs Or Egg-Derived Products Other (See Comments)    On testing  . Hydrochlorothiazide Other (See Comments)  . Morphine And Related Nausea And Vomiting    Medications Prior to Admission  Medication Sig Dispense Refill  . azelastine (ASTELIN) 0.1 % nasal spray  Place 1 spray into both nostrils 2 (two) times daily. Use in each nostril as directed    . carvedilol (COREG) 25 MG tablet Take 25 mg by mouth 2 (two) times daily with a meal.    . cetirizine (ZYRTEC) 10 MG tablet Take 10 mg by mouth daily.    Marland Kitchen EPINEPHrine 0.3 mg/0.3 mL IJ SOAJ injection Inject into the muscle once.    . ergocalciferol (VITAMIN D2) 50000 units capsule Take 50,000  Units by mouth once a week.    . fluticasone furoate-vilanterol (BREO ELLIPTA) 100-25 MCG/INH AEPB Inhale 1 puff into the lungs daily.    . montelukast (SINGULAIR) 10 MG tablet Take 10 mg by mouth at bedtime.    Marland Kitchen omeprazole (PRILOSEC) 40 MG capsule Take 40 mg by mouth 2 (two) times daily.    . potassium chloride 20 MEQ TBCR Take 20 mEq by mouth 2 (two) times daily. 30 tablet 0  . promethazine (PHENERGAN) 12.5 MG tablet Take 1 tablet by mouth every 6 (six) hours as needed.    . sertraline (ZOLOFT) 50 MG tablet Take 50 mg by mouth daily.    . simvastatin (ZOCOR) 40 MG tablet Take 40 mg by mouth daily.    . ondansetron (ZOFRAN ODT) 4 MG disintegrating tablet Take 1 tablet (4 mg total) by mouth every 8 (eight) hours as needed for nausea or vomiting. 20 tablet 0  . ranitidine (ZANTAC) 150 MG capsule Take 150 mg by mouth 2 (two) times daily.    Marland Kitchen torsemide (DEMADEX) 20 MG tablet Take 20 mg by mouth daily.    . trandolapril-verapamil (TARKA) 4-240 MG tablet Take 1 tablet by mouth daily.      Review of Systems: GENERAL:  Fatigue.  No fevers or sweats.  Weight loss of 74 pounds since 09/2016. PERFORMANCE STATUS (ECOG):  2 HEENT:  No visual changes, runny nose, sore throat, mouth sores or tenderness. Lungs: No shortness of breath or cough.  No hemoptysis. Cardiac:  No chest pain, palpitations, orthopnea, or PND. GI:  Diarrhea (see HPI).  Reflux.  Nausea.  No vomiting, constipation, melena or hematochezia. GU:  No urgency, frequency, dysuria, or hematuria. Musculoskeletal:  No back pain.  No joint pain.  No muscle tenderness. Extremities:  No pain or swelling. Skin:  No rashes or skin changes. Neuro:  General weakness.  No headache, numbness or weakness, balance or coordination issues. Endocrine:  No diabetes, thyroid issues, hot flashes or night sweats. Psych:  No mood changes, depression or anxiety. Pain:  No focal pain. Review of systems:  All other systems reviewed and found to be  negative.  Physical Exam:  Blood pressure 122/81, pulse 83, temperature 98.1 F (36.7 C), temperature source Oral, resp. rate 16, height _0  (1.676 m), weight 161 lb 9.6 oz (73.3 kg), SpO2 97 %.  GENERAL:  Fatigued appearing woman sitting comfortably on the medical unit in no acute distress. MENTAL STATUS:  Alert and oriented to person, place and time. HEAD:  Short blonde hair.  Normocephalic, atraumatic, face symmetric, no Cushingoid features. EYES:  Glasses.  Blue eyes.  Pupils equal round and reactive to light and accomodation.  No conjunctivitis or scleral icterus. ENT:  Oropharynx clear without lesion.  Tongue normal. Mucous membranes moist.  RESPIRATORY:  Clear to auscultation without rales, wheezes or rhonchi. CARDIOVASCULAR:  Regular rate and rhythm without murmur, rub or gallop. ABDOMEN:  Soft, non-tender, with active bowel sounds, and no hepatosplenomegaly.  No masses. SKIN:  No rashes, ulcers or  lesions. EXTREMITIES: No edema, no skin discoloration or tenderness.  No palpable cords. LYMPH NODES: No palpable cervical, supraclavicular, axillary or inguinal adenopathy  NEUROLOGICAL: Unremarkable. PSYCH:  Appropriate.   Results for orders placed or performed during the hospital encounter of 05/16/17 (from the past 48 hour(s))  Urinalysis, Complete w Microscopic     Status: Abnormal   Collection Time: 05/16/17  7:00 PM  Result Value Ref Range   Color, Urine STRAW (A) YELLOW   APPearance CLEAR (A) CLEAR   Specific Gravity, Urine 1.001 (L) 1.005 - 1.030   pH 7.0 5.0 - 8.0   Glucose, UA NEGATIVE NEGATIVE mg/dL   Hgb urine dipstick NEGATIVE NEGATIVE   Bilirubin Urine NEGATIVE NEGATIVE   Ketones, ur 5 (A) NEGATIVE mg/dL   Protein, ur NEGATIVE NEGATIVE mg/dL   Nitrite NEGATIVE NEGATIVE   Leukocytes, UA TRACE (A) NEGATIVE   RBC / HPF 0-5 0 - 5 RBC/hpf   WBC, UA 0-5 0 - 5 WBC/hpf   Bacteria, UA NONE SEEN NONE SEEN   Squamous Epithelial / LPF 0-5 (A) NONE SEEN  CBC     Status:  Abnormal   Collection Time: 05/17/17  4:24 AM  Result Value Ref Range   WBC 10.0 3.6 - 11.0 K/uL   RBC 3.39 (L) 3.80 - 5.20 MIL/uL   Hemoglobin 11.2 (L) 12.0 - 16.0 g/dL   HCT 34.0 (L) 35.0 - 47.0 %   MCV 100.2 (H) 80.0 - 100.0 fL   MCH 33.1 26.0 - 34.0 pg   MCHC 33.0 32.0 - 36.0 g/dL   RDW 15.0 (H) 11.5 - 14.5 %   Platelets 251 150 - 440 K/uL  Comprehensive metabolic panel     Status: Abnormal   Collection Time: 05/17/17  4:24 AM  Result Value Ref Range   Sodium 138 135 - 145 mmol/L   Potassium 3.4 (L) 3.5 - 5.1 mmol/L   Chloride 108 101 - 111 mmol/L   CO2 19 (L) 22 - 32 mmol/L   Glucose, Bld 73 65 - 99 mg/dL   BUN 5 (L) 6 - 20 mg/dL   Creatinine, Ser 0.72 0.44 - 1.00 mg/dL   Calcium 7.8 (L) 8.9 - 10.3 mg/dL   Total Protein 5.1 (L) 6.5 - 8.1 g/dL   Albumin 2.3 (L) 3.5 - 5.0 g/dL   AST 25 15 - 41 U/L   ALT 12 (L) 14 - 54 U/L   Alkaline Phosphatase 54 38 - 126 U/L   Total Bilirubin 1.2 0.3 - 1.2 mg/dL   GFR calc non Af Amer >60 >60 mL/min   GFR calc Af Amer >60 >60 mL/min    Comment: (NOTE) The eGFR has been calculated using the CKD EPI equation. This calculation has not been validated in all clinical situations. eGFR's persistently <60 mL/min signify possible Chronic Kidney Disease.    Anion gap 11 5 - 15  Magnesium     Status: Abnormal   Collection Time: 05/17/17  4:24 AM  Result Value Ref Range   Magnesium 1.4 (L) 1.7 - 2.4 mg/dL  Magnesium     Status: None   Collection Time: 05/17/17  6:22 PM  Result Value Ref Range   Magnesium 2.1 1.7 - 2.4 mg/dL  Phosphorus     Status: None   Collection Time: 05/17/17  6:22 PM  Result Value Ref Range   Phosphorus 2.5 2.5 - 4.6 mg/dL  Basic metabolic panel     Status: Abnormal   Collection Time: 05/18/17  4:21 AM  Result Value Ref Range   Sodium 140 135 - 145 mmol/L   Potassium 2.9 (L) 3.5 - 5.1 mmol/L   Chloride 109 101 - 111 mmol/L   CO2 22 22 - 32 mmol/L   Glucose, Bld 79 65 - 99 mg/dL   BUN <5 (L) 6 - 20 mg/dL    Creatinine, Ser 0.60 0.44 - 1.00 mg/dL   Calcium 7.6 (L) 8.9 - 10.3 mg/dL   GFR calc non Af Amer >60 >60 mL/min   GFR calc Af Amer >60 >60 mL/min    Comment: (NOTE) The eGFR has been calculated using the CKD EPI equation. This calculation has not been validated in all clinical situations. eGFR's persistently <60 mL/min signify possible Chronic Kidney Disease.    Anion gap 9 5 - 15  CBC     Status: Abnormal   Collection Time: 05/18/17  4:21 AM  Result Value Ref Range   WBC 7.7 3.6 - 11.0 K/uL   RBC 3.21 (L) 3.80 - 5.20 MIL/uL   Hemoglobin 10.9 (L) 12.0 - 16.0 g/dL   HCT 31.8 (L) 35.0 - 47.0 %   MCV 99.1 80.0 - 100.0 fL   MCH 33.9 26.0 - 34.0 pg   MCHC 34.3 32.0 - 36.0 g/dL   RDW 15.0 (H) 11.5 - 14.5 %   Platelets 212 150 - 440 K/uL  Magnesium     Status: None   Collection Time: 05/18/17  4:21 AM  Result Value Ref Range   Magnesium 2.1 1.7 - 2.4 mg/dL  Magnesium     Status: None   Collection Time: 05/18/17 11:38 AM  Result Value Ref Range   Magnesium 2.2 1.7 - 2.4 mg/dL   Mr Abdomen W Wo Contrast  Result Date: 05/17/2017 CLINICAL DATA:  Inpatient. Generalized weakness, anorexia and 70 pound weight loss over the prior several months. History of duodenal ulcer. 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 EXAM: MRI ABDOMEN WITHOUT AND WITH CONTRAST TECHNIQUE: Multiplanar multisequence MR imaging of the abdomen was performed both before and after the administration of intravenous contrast. CONTRAST:  37m MULTIHANCE GADOBENATE DIMEGLUMINE 529 MG/ML IV SOLN COMPARISON:  01/23/2017 right upper quadrant abdominal sonogram and CT abdomen/pelvis. FINDINGS: Lower chest: No acute abnormality at the lung bases. Hepatobiliary: Normal liver size and configuration. No hepatic steatosis. Subcentimeter simple segment 8 right liver lobe cyst. No liver masses. Normal gallbladder with no cholelithiasis. No biliary ductal dilatation. Common bile duct diameter 3 mm. No choledocholithiasis. Pancreas: There is a 0.4  cm focus of arterial phase hyperenhancement at the pancreaticoduodenal groove (series 16/ image 46) just above the ampulla. Otherwise no pancreatic mass. No pancreatic duct dilation. Pancreatic duct is diminutive and not well visualized, limiting assessment for pancreas divisum. Spleen: Normal size. No mass. Adrenals/Urinary Tract: Normal adrenals. No hydronephrosis. Normal kidneys with no renal mass. Stomach/Bowel: Grossly normal stomach. Visualized small and large bowel is normal caliber, with no bowel wall thickening. Vascular/Lymphatic: Normal caliber abdominal aorta. Patent portal, splenic, hepatic and renal veins. No pathologically enlarged lymph nodes in the abdomen. Other: No abdominal ascites or focal fluid collection. Limited visualization of a 3.2 cm right adnexal cyst (series 2/ image 10), stable in size since 01/23/2017 CT study. Musculoskeletal: No aggressive appearing focal osseous lesions. IMPRESSION: 1. Nonspecific tiny 0.4 cm focus of arterial phase hyperenhancement at the pancreaticoduodenal groove just above the ampulla. Tiny gastrinoma not excluded. Consider correlation with Octreoscan. 2. Otherwise no discrete pancreatic or duodenal mass. No abdominal lymphadenopathy. No suspicious liver masses. 3. Limited visualization of  a 3.2 cm right adnexal cyst, for which 3 month size stability has been demonstrated, suggesting a benign cyst. Consider attention on a follow-up pelvic ultrasound study in 6-12 months. Electronically Signed   By: Ilona Sorrel M.D.   On: 05/17/2017 15:08    Assessment:  The patient is a 66 y.o. woman with diarrhea since 11/2016.  She has been on a PPI since 02/2017.  Gastrin level was 262 (0-115) on 05/16/2017.  Colonoscopy on 02/16/2017 revealed small TA in ascending colon, sigmoid/descending diverticulosis.  EGD on 02/16/2017 revealed moderate Schatzki ring (dilated), gastritis, four non-bleeding crated and superficial duodenal ulcers within duodenal bulb.  Abdomen  MRI on 05/17/2017 revealed a nonspecific tiny 0.4 cm focus of arterial phase hyperenhancement at the pancreaticoduodenal groove just above the ampulla. Tiny gastrinoma was not excluded.   Symptomatically, she has ongoing diarrhea.  Stool studies have been negative.  She has lost 74 pounds since 09/2016.  Exam is normal.  Plan:   1. Oncology:  Etiology of diarrhea unclear, but may be related to a neuroendocrine tumor.  H2 blockers/PPI can increase gastrin level.  Agree with octreotide scan to assess small lesion noted on MRI.  Additional labs ordered:  chromogranin A, VIP (vasoactive intestinal peptide), and 24 hour urine for 5HIAA.    Continue supportive care with fluids and electrolyte replacement.   Thank you for allowing me to participate in Jenna Carlson 's care.  I will follow her closely with you while hospitalized and after discharge in the outpatient department.   Lequita Asal, MD  05/18/2017, 6:12 PM

## 2017-05-18 NOTE — Consult Note (Signed)
PHARMACY CONSULT NOTE - INITIAL   Pharmacy Consult for Electrolyte Management   Patient Measurements: Height: 5\' 6"  (167.6 cm) Weight: 161 lb 9.6 oz (73.3 kg)(bed scale) IBW/kg (Calculated) : 59.3 Intake/Output from previous day: 11/29 0701 - 11/30 0700 In: 240 [P.O.:240] Out: -  Intake/Output from this shift: Total I/O In: 240 [P.O.:240] Out: -   Potassium (mmol/L)  Date Value  05/18/2017 4.3   Magnesium (mg/dL)  Date Value  05/18/2017 2.2   Calcium (mg/dL)  Date Value  05/18/2017 7.6 (L)   Albumin (g/dL)  Date Value  05/17/2017 2.3 (L)   Phosphorus (mg/dL)  Date Value  05/17/2017 2.5  ] Labs: Estimated Creatinine Clearance: 69 mL/min (by C-G formula based on SCr of 0.6 mg/dL).   Assessment: Pharmacy consulted for electrolyte management in 66 yo female admitted with progressive weakness.   Goal of Therapy:  Electrolytes WNL  Plan:  Patient received 20mEq IV and 36mEq po after yesterdays AM lab. K+ fell from 3.4 to 2.9. Will replace with KCL 10 mEq IV x 2 and KCL 40 mEq PO x 2. Will recheck K+ at 1800.   All Electrolytes will be rechecked with AM labs tomorrow.  05/18/2017 1803 K 4.3. Will give additional KCl 20 mEq po x 1 and recheck electrolytes tomorrow with AM labs.  Laural Benes, PharmD, BCPS Clinical Pharmacist 05/18/2017 6:49 PM

## 2017-05-18 NOTE — Evaluation (Signed)
Physical Therapy Evaluation Patient Details Name: Jenna Carlson MRN: [id number] DOB: 02-27-1951 Today's Date: 05/18/2017   History of Present Illness  presented to ER secondary to progressive weakness, inability to mobilize in home environment; admitted with generalized weakness secondary to electrolyte abnormality (hypokalemia, hypomagnesia).  Pending work up for possible gastrinoma.  Clinical Impression  Upon evaluation, patient alert and oriented; follows all commands and demonstrates good effort with all functional activities.  Bilat UE/LE strength generally deconditioned due to acute illness, but functional for basic transfers and mobility.  Able to complete bed mobility indep; sit/stand, basic transfers and gait (25' x2) with HHA/RW.  Short, choppy steps with limited balance/activity tolerance; recommend continued use of RW for optimal safety at this time. Would benefit from skilled PT to address above deficits and promote optimal return to PLOF; Recommend transition to Datil upon discharge from acute hospitalization.     Follow Up Recommendations Home health PT    Equipment Recommendations  Rolling walker with 5" wheels    Recommendations for Other Services       Precautions / Restrictions Precautions Precautions: Fall Precaution Comments: enteric iso Restrictions Weight Bearing Restrictions: No      Mobility  Bed Mobility Overal bed mobility: Independent                Transfers Overall transfer level: Needs assistance Equipment used: None Transfers: Sit to/from Stand Sit to Stand: Min guard            Ambulation/Gait Ambulation/Gait assistance: Min assist Ambulation Distance (Feet): 25 Feet Assistive device: 1 person hand held assist       General Gait Details: broad BOS, short choppy steps; LEs mildly tremulous with poor balance reactions. Intermittently reaching for furniture for external stabilization  Stairs            Wheelchair  Mobility    Modified Rankin (Stroke Patients Only)       Balance Overall balance assessment: Needs assistance   Sitting balance-Leahy Scale: Good     Standing balance support: No upper extremity supported Standing balance-Leahy Scale: Poor                               Pertinent Vitals/Pain Pain Assessment: No/denies pain    Home Living Family/patient expects to be discharged to:: Private residence Living Arrangements: Alone Available Help at Discharge: Family;Available PRN/intermittently Type of Home: House Home Access: Stairs to enter Entrance Stairs-Rails: Can reach both Entrance Stairs-Number of Steps: 4-5 Home Layout: One level Home Equipment: None      Prior Function Level of Independence: Independent         Comments: Indep with ADLs, household and community mobilization without assist device; active, participating in community fitness groups 3x/week     Hand Dominance        Extremity/Trunk Assessment   Upper Extremity Assessment Upper Extremity Assessment: Overall WFL for tasks assessed    Lower Extremity Assessment Lower Extremity Assessment: Generalized weakness(grossly 3-/5 throughout)       Communication   Communication: No difficulties  Cognition Arousal/Alertness: Awake/alert Behavior During Therapy: WFL for tasks assessed/performed Overall Cognitive Status: Within Functional Limits for tasks assessed                                        General Comments      Exercises Other  Exercises Other Exercises: 79' with RW, cga/close sup-improved stability with use of RW; greater ease/fluidity of movement.  Recommend continued use upon discharge Other Exercises: Additional gait distance/activity deferred, as IV team arrived to place IV   Assessment/Plan    PT Assessment Patient needs continued PT services  PT Problem List Decreased strength;Decreased activity tolerance;Decreased balance;Decreased  mobility       PT Treatment Interventions DME instruction;Gait training;Stair training;Functional mobility training;Therapeutic activities;Therapeutic exercise;Balance training;Patient/family education    PT Goals (Current goals can be found in the Care Plan section)  Acute Rehab PT Goals Patient Stated Goal: to build strength up PT Goal Formulation: With patient Time For Goal Achievement: 06/01/17 Potential to Achieve Goals: Good    Frequency Min 2X/week   Barriers to discharge Decreased caregiver support      Co-evaluation               AM-PAC PT "6 Clicks" Daily Activity  Outcome Measure Difficulty turning over in bed (including adjusting bedclothes, sheets and blankets)?: None Difficulty moving from lying on back to sitting on the side of the bed? : None Difficulty sitting down on and standing up from a chair with arms (e.g., wheelchair, bedside commode, etc,.)?: Unable Help needed moving to and from a bed to chair (including a wheelchair)?: A Little Help needed walking in hospital room?: A Little Help needed climbing 3-5 steps with a railing? : A Little 6 Click Score: 18    End of Session Equipment Utilized During Treatment: Gait belt Activity Tolerance: Patient tolerated treatment well Patient left: in chair;with chair alarm set;with family/visitor present;with call bell/phone within reach Nurse Communication: Mobility status PT Visit Diagnosis: Muscle weakness (generalized) (M62.81);Difficulty in walking, not elsewhere classified (R26.2)    Time: 1422-1440 PT Time Calculation (min) (ACUTE ONLY): 18 min   Charges:   PT Evaluation $PT Eval Low Complexity: 1 Low PT Treatments $Gait Training: 8-22 mins   PT G Codes:   PT G-Codes **NOT FOR INPATIENT CLASS** Functional Assessment Tool Used: AM-PAC 6 Clicks Basic Mobility Functional Limitation: Mobility: Walking and moving around Mobility: Walking and Moving Around Current Status (L4650): At least 20 percent  but less than 40 percent impaired, limited or restricted Mobility: Walking and Moving Around Goal Status [phone]): At least 1 percent but less than 20 percent impaired, limited or restricted    Kamisha Ell H. Owens Shark, PT, DPT, NCS 05/18/17, 9:19 PM [phone]

## 2017-05-18 NOTE — Care Management Important Message (Signed)
Important Message  Patient Details  Name: Jenna Carlson MRN: [id number] Date of Birth: Jul 23, 1950   Medicare Important Message Given:  Yes    Shelbie Ammons, RN 05/18/2017, 7:41 AM

## 2017-05-18 NOTE — Consult Note (Signed)
PHARMACY CONSULT NOTE - INITIAL   Pharmacy Consult for Electrolyte Management   Patient Measurements: Height: 5\' 6"  (167.6 cm) Weight: 161 lb 9.6 oz (73.3 kg)(bed scale) IBW/kg (Calculated) : 59.3 Intake/Output from previous day: 11/29 0701 - 11/30 0700 In: 240 [P.O.:240] Out: -  Intake/Output from this shift: No intake/output data recorded.  Potassium (mmol/L)  Date Value  05/18/2017 2.9 (L)   Magnesium (mg/dL)  Date Value  05/18/2017 2.1   Calcium (mg/dL)  Date Value  05/18/2017 7.6 (L)   Albumin (g/dL)  Date Value  05/17/2017 2.3 (L)   Phosphorus (mg/dL)  Date Value  05/17/2017 2.5  ] Labs: Estimated Creatinine Clearance: 69 mL/min (by C-G formula based on SCr of 0.6 mg/dL).   Assessment: Pharmacy consulted for electrolyte management in 66 yo female admitted with progressive weakness.   Goal of Therapy:  Electrolytes WNL  Plan:  Patient received 72mEq IV and 76mEq po after yesterdays AM lab. K+ fell from 3.4 to 2.9. Will replace with KCL 10 mEq IV x 2 and KCL 40 mEq PO x 2. Will recheck K+ at 1800.   All Electrolytes will be rechecked with AM labs tomorrow.  Pernell Dupre, PharmD, BCPS Clinical Pharmacist 05/18/2017 7:49 AM

## 2017-05-18 NOTE — Progress Notes (Signed)
[place] at [place] NAME: Jenna Carlson    MR#:  [id number]  DATE OF BIRTH:  [birth year]  SUBJECTIVE:   Patient had diarrhea last night after drinking ensure.  REVIEW OF SYSTEMS:    Review of Systems  Constitutional: Negative for fever, chills++  weight loss and generalized weakness HENT: Negative for ear pain, nosebleeds, congestion, facial swelling, rhinorrhea, neck pain, neck stiffness and ear discharge.   Respiratory: Negative for cough, shortness of breath, wheezing  Cardiovascular: Negative for chest pain, palpitations and leg swelling.  Gastrointestinal: Negative for heartburn, abdominal pain, vomiting, ++diarrhea Genitourinary: Negative for dysuria, urgency, frequency, hematuria Musculoskeletal: Negative for back pain or joint pain Neurological: Negative for dizziness, seizures, syncope, focal weakness,  numbness and headaches.  Hematological: Does not bruise/bleed easily.  Psychiatric/Behavioral: Negative for hallucinations, confusion, dysphoric mood    Tolerating Diet: yes      DRUG ALLERGIES:   Allergies  Allergen Reactions  . Iodinated Diagnostic Agents Shortness Of Breath  . Aspirin Tinitus  . Codeine   . Eggs Or Egg-Derived Products Other (See Comments)    On testing  . Hydrochlorothiazide Other (See Comments)  . Morphine And Related Nausea And Vomiting    VITALS:  Blood pressure 122/81, pulse 83, temperature 98.1 F (36.7 C), temperature source Oral, resp. rate 16, height 5\' 6"  (1.676 m), weight 73.3 kg (161 lb 9.6 oz), SpO2 97 %.  PHYSICAL EXAMINATION:  Constitutional: Appears well-developed and well-nourished. No distress. HENT: Normocephalic. Marland Kitchen Oropharynx is clear and moist.  Eyes: Conjunctivae and EOM are normal. PERRLA, no scleral icterus.  Neck: Normal ROM. Neck supple. No JVD. No tracheal deviation. CVS: RRR, S1/S2 +, no murmurs, no gallops, no carotid bruit.  Pulmonary: Effort and breath sounds  normal, no stridor, rhonchi, wheezes, rales.  Abdominal: Soft. BS +,  no distension, tenderness, rebound or guarding.  Musculoskeletal: Normal range of motion. No edema and no tenderness.  Neuro: Alert. CN 2-12 grossly intact. No focal deficits. Skin: Skin is warm and dry. No rash noted. Psychiatric: Normal mood and affect.      LABORATORY PANEL:   CBC Recent Labs  Lab 05/18/17 0421  WBC 7.7  HGB 10.9*  HCT 31.8*  PLT 212   ------------------------------------------------------------------------------------------------------------------  Chemistries  Recent Labs  Lab 05/17/17 0424  05/18/17 0421  NA 138  --  140  K 3.4*  --  2.9*  CL 108  --  109  CO2 19*  --  22  GLUCOSE 73  --  79  BUN 5*  --  <5*  CREATININE 0.72  --  0.60  CALCIUM 7.8*  --  7.6*  MG 1.4*   < > 2.1  AST 25  --   --   ALT 12*  --   --   ALKPHOS 54  --   --   BILITOT 1.2  --   --    < > = values in this interval not displayed.   ------------------------------------------------------------------------------------------------------------------  Cardiac Enzymes No results for input(s): TROPONINI in the last 168 hours. ------------------------------------------------------------------------------------------------------------------  RADIOLOGY:  Mr Abdomen W Wo Contrast  Result Date: 05/17/2017 CLINICAL DATA:  Inpatient. Generalized weakness, anorexia and 70 pound weight loss over the prior several months. History of duodenal ulcer. 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 EXAM: MRI ABDOMEN WITHOUT AND WITH CONTRAST TECHNIQUE: Multiplanar multisequence MR imaging of the abdomen was performed both before and after the administration of intravenous contrast. CONTRAST:  77mL MULTIHANCE GADOBENATE DIMEGLUMINE 529 MG/ML  IV SOLN COMPARISON:  01/23/2017 right upper quadrant abdominal sonogram and CT abdomen/pelvis. FINDINGS: Lower chest: No acute abnormality at the lung bases. Hepatobiliary: Normal liver size and  configuration. No hepatic steatosis. Subcentimeter simple segment 8 right liver lobe cyst. No liver masses. Normal gallbladder with no cholelithiasis. No biliary ductal dilatation. Common bile duct diameter 3 mm. No choledocholithiasis. Pancreas: There is a 0.4 cm focus of arterial phase hyperenhancement at the pancreaticoduodenal groove (series 16/ image 46) just above the ampulla. Otherwise no pancreatic mass. No pancreatic duct dilation. Pancreatic duct is diminutive and not well visualized, limiting assessment for pancreas divisum. Spleen: Normal size. No mass. Adrenals/Urinary Tract: Normal adrenals. No hydronephrosis. Normal kidneys with no renal mass. Stomach/Bowel: Grossly normal stomach. Visualized small and large bowel is normal caliber, with no bowel wall thickening. Vascular/Lymphatic: Normal caliber abdominal aorta. Patent portal, splenic, hepatic and renal veins. No pathologically enlarged lymph nodes in the abdomen. Other: No abdominal ascites or focal fluid collection. Limited visualization of a 3.2 cm right adnexal cyst (series 2/ image 10), stable in size since 01/23/2017 CT study. Musculoskeletal: No aggressive appearing focal osseous lesions. IMPRESSION: 1. Nonspecific tiny 0.4 cm focus of arterial phase hyperenhancement at the pancreaticoduodenal groove just above the ampulla. Tiny gastrinoma not excluded. Consider correlation with Octreoscan. 2. Otherwise no discrete pancreatic or duodenal mass. No abdominal lymphadenopathy. No suspicious liver masses. 3. Limited visualization of a 3.2 cm right adnexal cyst, for which 3 month size stability has been demonstrated, suggesting a benign cyst. Consider attention on a follow-up pelvic ultrasound study in 6-12 months. Electronically Signed   By: Ilona Sorrel M.D.   On: 05/17/2017 15:08     ASSESSMENT AND PLAN:   66 year old female with a history of duodenal ulcer who presents to generalized weakness.   1. Generalized weakness due to  electrolyte abnormalities which may be in part due to weight loss, poor nutrition with severe malnutrition and torsemide Physical therapy consultation ending Replace electrolytes Stop torsemide Dietitian consult appreciated.. She does not want to drink ensure.  2. Hypokalemia and hypomagnesemia: These will be repleted and rechecked in a.m. Stop torsemide 3. History of duodenal ulcer: Continue PPI and H2 blocker  4. Severe malnutrition with Weight loss:  MRI of the abdomen shows possible gastrinoma Octreoscan scheduled for Monday She will need antinausea medications prior to scan.  5. Essential hypertension: Continue TARKA and coreg  6. Hyperlipidemia: Continue Zocor  7. Depression: Continue Zoloft   Management plans discussed with the patient and she is in agreement.  Potentially patient is feeling better and her electrolytes have improved she can have this scan done as an outpatient. I will discuss with with her this weekend.  CODE STATUS: full  TOTAL TIME TAKING CARE OF THIS PATIENT: 24 minutes.     POSSIBLE D/C 1-2 days, DEPENDING ON CLINICAL CONDITION.   Athanasios Heldman M.D on 05/18/2017 at 9:53 AM  Between 7am to 6pm - Pager - [phone] After 6pm go to [email or web address] - password [email or web address] [facility]  Office  [phone]  CC: Primary care physician; Kirk Ruths, MD  Note: This dictation was prepared with Dragon dictation along with smaller phrase technology. Any transcriptional errors that result from this process are unintentional.

## 2017-05-19 LAB — BASIC METABOLIC PANEL
ANION GAP: 8 (ref 5–15)
BUN: <5 mg/dL — ABNORMAL LOW (ref 6–20)
CHLORIDE: 111 mmol/L (ref 101–111)
CO2: 20 mmol/L — ABNORMAL LOW (ref 22–32)
Calcium: 8.1 mg/dL — ABNORMAL LOW (ref 8.9–10.3)
Creatinine, Ser: 0.62 mg/dL (ref 0.44–1.00)
Glucose, Bld: 83 mg/dL (ref 65–99)
POTASSIUM: 3.9 mmol/L (ref 3.5–5.1)
SODIUM: 139 mmol/L (ref 135–145)

## 2017-05-19 LAB — PHOSPHORUS: PHOSPHORUS: 2.7 mg/dL (ref 2.5–4.6)

## 2017-05-19 MED ORDER — SODIUM CHLORIDE 0.9% FLUSH
3.0000 mL | Freq: Two times a day (BID) | INTRAVENOUS | Status: DC
  Administered 2017-05-19 – 2017-05-22 (×7): 3 mL via INTRAVENOUS

## 2017-05-19 NOTE — Progress Notes (Signed)
Two brown diarrhea stools today. Continuing to collect 24 hour urine. Case mgt referral and SW referral submitted to provide resources/increase level of care of home that pt identifies as needs; also relies on friends for transportation. Tylenol effective x 1 for HA. Pt admits to having feelings of anxiety about going home.

## 2017-05-19 NOTE — Progress Notes (Signed)
[place] at [place] NAME: Jenna Carlson    MR#:  [id number]  DATE OF BIRTH:  [birth year]  SUBJECTIVE:   Patient doing well this morning. Sister's concern about patient being discharged home due to weakness  REVIEW OF SYSTEMS:    Review of Systems  Constitutional: Negative for fever, chills++  weight loss and generalized weakness HENT: Negative for ear pain, nosebleeds, congestion, facial swelling, rhinorrhea, neck pain, neck stiffness and ear discharge.   Respiratory: Negative for cough, shortness of breath, wheezing  Cardiovascular: Negative for chest pain, palpitations and leg swelling.  Gastrointestinal: Negative for heartburn, abdominal pain, vomiting, no diarrhea overnight Genitourinary: Negative for dysuria, urgency, frequency, hematuria Musculoskeletal: Negative for back pain or joint pain Neurological: Negative for dizziness, seizures, syncope, focal weakness,  numbness and headaches.  Hematological: Does not bruise/bleed easily.  Psychiatric/Behavioral: Negative for hallucinations, confusion, dysphoric mood    Tolerating Diet: yes      DRUG ALLERGIES:   Allergies  Allergen Reactions  . Iodinated Diagnostic Agents Shortness Of Breath  . Aspirin Tinitus  . Codeine   . Eggs Or Egg-Derived Products Other (See Comments)    On testing  . Hydrochlorothiazide Other (See Comments)  . Morphine And Related Nausea And Vomiting    VITALS:  Blood pressure (!) 152/81, pulse 72, temperature 98.4 F (36.9 C), temperature source Oral, resp. rate 20, height 5\' 6"  (1.676 m), weight 73.3 kg (161 lb 9.6 oz), SpO2 98 %.  PHYSICAL EXAMINATION:  Constitutional: Appears well-developed and well-nourished. No distress. HENT: Normocephalic. Marland Kitchen Oropharynx is clear and moist.  Eyes: Conjunctivae and EOM are normal. PERRLA, no scleral icterus.  Neck: Normal ROM. Neck supple. No JVD. No tracheal deviation. CVS: RRR, S1/S2 +, no murmurs, no  gallops, no carotid bruit.  Pulmonary: Effort and breath sounds normal, no stridor, rhonchi, wheezes, rales.  Abdominal: Soft. BS +,  no distension, tenderness, rebound or guarding.  Musculoskeletal: Normal range of motion. No edema and no tenderness.  Neuro: Alert. CN 2-12 grossly intact. No focal deficits. Skin: Skin is warm and dry. No rash noted. Psychiatric: Normal mood and affect.      LABORATORY PANEL:   CBC Recent Labs  Lab 05/18/17 0421  WBC 7.7  HGB 10.9*  HCT 31.8*  PLT 212   ------------------------------------------------------------------------------------------------------------------  Chemistries  Recent Labs  Lab 05/17/17 0424  05/18/17 1138  05/19/17 0542  NA 138   < >  --   --  139  K 3.4*   < >  --    < > 3.9  CL 108   < >  --   --  111  CO2 19*   < >  --   --  20*  GLUCOSE 73   < >  --   --  83  BUN 5*   < >  --   --  <5*  CREATININE 0.72   < >  --   --  0.62  CALCIUM 7.8*   < >  --   --  8.1*  MG 1.4*   < > 2.2  --   --   AST 25  --   --   --   --   ALT 12*  --   --   --   --   ALKPHOS 54  --   --   --   --   BILITOT 1.2  --   --   --   --    < > =  values in this interval not displayed.   ------------------------------------------------------------------------------------------------------------------  Cardiac Enzymes No results for input(s): TROPONINI in the last 168 hours. ------------------------------------------------------------------------------------------------------------------  RADIOLOGY:  Mr Abdomen W Wo Contrast  Result Date: 05/17/2017 CLINICAL DATA:  Inpatient. Generalized weakness, anorexia and 70 pound weight loss over the prior several months. History of duodenal ulcer. 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 EXAM: MRI ABDOMEN WITHOUT AND WITH CONTRAST TECHNIQUE: Multiplanar multisequence MR imaging of the abdomen was performed both before and after the administration of intravenous contrast. CONTRAST:  72mL MULTIHANCE GADOBENATE  DIMEGLUMINE 529 MG/ML IV SOLN COMPARISON:  01/23/2017 right upper quadrant abdominal sonogram and CT abdomen/pelvis. FINDINGS: Lower chest: No acute abnormality at the lung bases. Hepatobiliary: Normal liver size and configuration. No hepatic steatosis. Subcentimeter simple segment 8 right liver lobe cyst. No liver masses. Normal gallbladder with no cholelithiasis. No biliary ductal dilatation. Common bile duct diameter 3 mm. No choledocholithiasis. Pancreas: There is a 0.4 cm focus of arterial phase hyperenhancement at the pancreaticoduodenal groove (series 16/ image 46) just above the ampulla. Otherwise no pancreatic mass. No pancreatic duct dilation. Pancreatic duct is diminutive and not well visualized, limiting assessment for pancreas divisum. Spleen: Normal size. No mass. Adrenals/Urinary Tract: Normal adrenals. No hydronephrosis. Normal kidneys with no renal mass. Stomach/Bowel: Grossly normal stomach. Visualized small and large bowel is normal caliber, with no bowel wall thickening. Vascular/Lymphatic: Normal caliber abdominal aorta. Patent portal, splenic, hepatic and renal veins. No pathologically enlarged lymph nodes in the abdomen. Other: No abdominal ascites or focal fluid collection. Limited visualization of a 3.2 cm right adnexal cyst (series 2/ image 10), stable in size since 01/23/2017 CT study. Musculoskeletal: No aggressive appearing focal osseous lesions. IMPRESSION: 1. Nonspecific tiny 0.4 cm focus of arterial phase hyperenhancement at the pancreaticoduodenal groove just above the ampulla. Tiny gastrinoma not excluded. Consider correlation with Octreoscan. 2. Otherwise no discrete pancreatic or duodenal mass. No abdominal lymphadenopathy. No suspicious liver masses. 3. Limited visualization of a 3.2 cm right adnexal cyst, for which 3 month size stability has been demonstrated, suggesting a benign cyst. Consider attention on a follow-up pelvic ultrasound study in 6-12 months. Electronically  Signed   By: Ilona Sorrel M.D.   On: 05/17/2017 15:08     ASSESSMENT AND PLAN:   66 year old female with a history of duodenal ulcer who presents to generalized weakness.   1. Generalized weakness due to electrolyte abnormalities which may be in part due to weight loss, poor nutrition with severe malnutrition and torsemide Physical therapy consultation is recommending home with home health at discharge  Replace electrolytes as needed Stop torsemide Dietitian consult appreciated.. She does not want to drink ensure.  2. Hypokalemia and hypomagnesemia: Electrolytes are within normal limits today   Stop torsemide 3. History of duodenal ulcer: Continue PPI and H2 blocker  4. Severe malnutrition with Weight loss:  MRI of the abdomen shows possible gastrinoma Octreoscan scheduled for Monday She will need antinausea medications prior to scan. Appreciate oncology evaluation  5. Essential hypertension: Continue TARKA and coreg  6. Hyperlipidemia: Continue Zocor  7. Depression: Continue Zoloft   Management plans discussed with the patient and she is in agreement.  Patient and patient's sister prefers patient to stay through the weekend and obtain octreoscan during this hospital stay.  CODE STATUS: full  TOTAL TIME TAKING CARE OF THIS PATIENT: 24 minutes.     POSSIBLE D/C Tuesday, DEPENDING ON CLINICAL CONDITION.   Eastyn Dattilo M.D on 05/19/2017 at 8:44 AM  Between 7am to 6pm -  Pager - [phone] After 6pm go to [email or web address] - password [email or web address] [facility]  Office  [phone]  CC: Primary care physician; Kirk Ruths, MD  Note: This dictation was prepared with Dragon dictation along with smaller phrase technology. Any transcriptional errors that result from this process are unintentional.

## 2017-05-19 NOTE — Plan of Care (Signed)
Pt. Progressing in all areas.

## 2017-05-19 NOTE — Plan of Care (Signed)
Progressing in all giving areas

## 2017-05-19 NOTE — Plan of Care (Signed)
Disccussed with pt. Plan of care addressed pt concerns and question. Pt. States no further questions at this pt. Will continue to monitor and progress to transitioning in recovery process.

## 2017-05-19 NOTE — Progress Notes (Signed)
Reports feeling stronger and eating better. 24 hour urine in progress. K+ has normalized with K+ supplement given- will monitor K+ levels and and diarrhea.

## 2017-05-19 NOTE — Consult Note (Signed)
Speculator for Electrolyte Management   Patient Measurements: Height: 5\' 6"  (167.6 cm) Weight: 161 lb 9.6 oz (73.3 kg)(bed scale) IBW/kg (Calculated) : 59.3 Intake/Output from previous day: 11/30 0701 - 12/01 0700 In: 240 [P.O.:240] Out: -  Intake/Output from this shift: Total I/O In: 80 [P.O.:80] Out: 100 [Urine:100]  Potassium (mmol/L)  Date Value  05/19/2017 3.9   Magnesium (mg/dL)  Date Value  05/18/2017 2.2   Calcium (mg/dL)  Date Value  05/19/2017 8.1 (L)   Albumin (g/dL)  Date Value  05/17/2017 2.3 (L)   Phosphorus (mg/dL)  Date Value  05/19/2017 2.7  ] Labs: Estimated Creatinine Clearance: 69 mL/min (by C-G formula based on SCr of 0.62 mg/dL).   Assessment: Pharmacy consulted for electrolyte management in 66 yo female admitted with progressive weakness.  Current orders for KCl 20 mEq BID (home med). Torsemide stopped 11/29.  Pt received KCl 100 mEq PO and 20 mEq IV yesterday.   Goal of Therapy:  Electrolytes WNL  Plan:  K 3.9, Phos 2.7 No additional supplementation needed at this time.  Will check K and Mag in AM.      Rocky Morel, PharmD, BCPS Clinical Pharmacist 05/19/2017 11:35 AM

## 2017-05-20 LAB — BASIC METABOLIC PANEL
Anion gap: 5 (ref 5–15)
BUN: 5 mg/dL — AB (ref 6–20)
CALCIUM: 8.2 mg/dL — AB (ref 8.9–10.3)
CO2: 22 mmol/L (ref 22–32)
CREATININE: 0.67 mg/dL (ref 0.44–1.00)
Chloride: 111 mmol/L (ref 101–111)
GFR calc Af Amer: >60 mL/min (ref >60)
Glucose, Bld: 88 mg/dL (ref 65–99)
Potassium: 4 mmol/L (ref 3.5–5.1)
SODIUM: 138 mmol/L (ref 135–145)

## 2017-05-20 LAB — MAGNESIUM: Magnesium: 1.7 mg/dL (ref 1.7–2.4)

## 2017-05-20 MED ORDER — ENSURE ENLIVE PO LIQD
237.0000 mL | Freq: Three times a day (TID) | ORAL | Status: DC
  Administered 2017-05-20 – 2017-05-22 (×3): 237 mL via ORAL

## 2017-05-20 MED ORDER — SODIUM CHLORIDE 0.9 % IV BOLUS (SEPSIS)
500.0000 mL | Freq: Once | INTRAVENOUS | Status: AC
  Administered 2017-05-20: 14:00:00 500 mL via INTRAVENOUS

## 2017-05-20 MED ORDER — VERAPAMIL HCL ER 240 MG PO TBCR: 240.0000 mg | EXTENDED_RELEASE_TABLET | Freq: Every day | ORAL | 0 refills | Status: DC

## 2017-05-20 NOTE — Clinical Social Work Note (Addendum)
Clinical Social Work Assessment  Patient Details  Name: Jenna Carlson MRN: [id number] Date of Birth: July 13, 1950  Date of referral:  05/20/17               Reason for consult:  Facility Placement                Permission sought to share information with:  Facility Art therapist granted to share information::  Yes, Verbal Permission Granted  Name::        Agency::     Relationship::     Contact Information:     Housing/Transportation Living arrangements for the past 2 months:  Single Family Home Source of Information:  Patient, Medical Team Patient Interpreter Needed:  None Criminal Activity/Legal Involvement Pertinent to Current Situation/Hospitalization:  No - Comment as needed Significant Relationships:  Siblings Lives with:  Self Do you feel safe going back to the place where you live?  Yes Need for family participation in patient care:  No (Coment)  Care giving concerns:  PT recommendation for STR  Social Worker assessment / plan:  CSW met with the patient at bedside to discuss discharge planning. The patient verbalized consent to process a referral to SNF, and she reported that her preference is for [facility] or [facility]. The CSW explained the referral process and the potential for neither location to have bed offers. The patient verbalized understanding.  The patient will discharge tomorrow. The patient's PASARR is pending due to [facility] MUST services being down; they will be available the morning of 12/3. CSW will continue to follow to facilitate discharge.   Employment status:  Retired Nurse, adult PT Recommendations:  [place] / Referral to community resources:  [place]  Patient/Family's Response to care:  The patient thanked the CSW for assistance.   Patient/Family's Understanding of and Emotional Response to Diagnosis, Current Treatment, and Prognosis:  The patient  understands the discharge plan and is in agreement.  Emotional Assessment Appearance:  Appears stated age Attitude/Demeanor/Rapport:  (Alert and pleasant) Affect (typically observed):  Appropriate, Pleasant Orientation:  Oriented to Self, Oriented to  Time, Oriented to Situation, Oriented to Place Alcohol / Substance use:  Never Used Psych involvement (Current and /or in the community):  No (Comment)  Discharge Needs  Concerns to be addressed:  Care Coordination, Discharge Planning Concerns Readmission within the last 30 days:  No Current discharge risk:  Chronically ill, Lives alone Barriers to Discharge:  Continued Medical Work up   [employer], LCSW 05/20/2017, 10:36 AM

## 2017-05-20 NOTE — Progress Notes (Signed)
Urine in 24 hour container not consistent with pt voiding and output since yesterday- pt told 7P nurse she might have gone and not saved it. Restarted 24 hour urine with staff and lab notified; signage placed. Pt educated to save all urine for 24 hours.

## 2017-05-20 NOTE — Progress Notes (Signed)
On routine BP check, found hypotension with pt asymptomatic. MD notified with new orders obtained with coreg discontinued and NS 500 ml bolus started. Safety education done and included slow position changes.

## 2017-05-20 NOTE — NC FL2 (Signed)
[place] LEVEL OF CARE SCREENING TOOL     IDENTIFICATION  Patient Name: Jenna Carlson Birthdate: Nov 29, 1950 Sex: female Admission Date (Current Location): 05/16/2017  [place] and [place] Number:  Engineering geologist and Address:  [facility] [address] [place], [place] [address]      Provider Number: [id number]  Attending Physician Name and Address:  Bettey Costa, MD  Relative Name and Phone Number:  Teddy Spike (sister) [phone]    Current Level of Care: Hospital Recommended Level of Care: [place] Prior Approval Number:    Date Approved/Denied:   PASRR Number:    Discharge Plan: SNF    Current Diagnoses: Patient Active Problem List   Diagnosis Date Noted  . Diarrhea   . Weakness 05/16/2017  . Hypokalemia 05/16/2017  . Multiple duodenal ulcers 05/16/2017  . HTN (hypertension) 05/16/2017  . HLD (hyperlipidemia) 05/16/2017    Orientation RESPIRATION BLADDER Height & Weight     Self, Time, Situation, Place  Normal Continent Weight: 161 lb 9.6 oz (73.3 kg)(bed scale) Height:  5\' 6"  (167.6 cm)  BEHAVIORAL SYMPTOMS/MOOD NEUROLOGICAL BOWEL NUTRITION STATUS      Continent    AMBULATORY STATUS COMMUNICATION OF NEEDS Skin   Extensive Assist Verbally Normal                       Personal Care Assistance Level of Assistance  Bathing, Feeding, Dressing Bathing Assistance: Limited assistance Feeding assistance: Independent Dressing Assistance: Limited assistance     Functional Limitations Info             SPECIAL CARE FACTORS FREQUENCY  PT (By licensed PT)     PT Frequency: Up to 5X per day, 5 days per week              Contractures Contractures Info: Not present    Additional Factors Info  Code Status, Allergies, Psychotropic Code Status Info: Full Allergies Info: Iodinated Diagnostic Agents, Aspirin, Codeine, Eggs Or Egg-derived Products, Hydrochlorothiazide, Morphine And  Related Psychotropic Info: Zoloft         Current Medications (05/20/2017):  This is the current hospital active medication list Current Facility-Administered Medications  Medication Dose Route Frequency Provider Last Rate Last Dose  . acetaminophen (TYLENOL) tablet 650 mg  650 mg Oral Q6H PRN Lance Coon, MD   650 mg at 05/19/17 1609   Or  . acetaminophen (TYLENOL) suppository 650 mg  650 mg Rectal Q6H PRN Lance Coon, MD      . carvedilol (COREG) tablet 25 mg  25 mg Oral BID WC Lance Coon, MD   25 mg at 05/20/17 0917  . enoxaparin (LOVENOX) injection 40 mg  40 mg Subcutaneous Q24H Lance Coon, MD   40 mg at 05/19/17 2136  . feeding supplement (ENSURE ENLIVE) (ENSURE ENLIVE) liquid 237 mL  237 mL Oral TID BM Mody, Sital, MD      . fluticasone furoate-vilanterol (BREO ELLIPTA) 100-25 MCG/INH 1 puff  1 puff Inhalation Daily Lance Coon, MD   1 puff at 05/20/17 [id number]  . montelukast (SINGULAIR) tablet 10 mg  10 mg Oral Corwin Levins, MD   10 mg at 05/19/17 2135  . multivitamin with minerals tablet 1 tablet  1 tablet Oral Daily Bettey Costa, MD   1 tablet at 05/20/17 0917  . pantoprazole (PROTONIX) EC tablet 40 mg  40 mg Oral BID Lance Coon, MD   40 mg at 05/20/17 0915  .  potassium chloride SA (K-DUR,KLOR-CON) CR tablet 20 mEq  20 mEq Oral BID Hallaji, Sheema M, RPH   20 mEq at 05/20/17 0930  . promethazine (PHENERGAN) tablet 12.5 mg  12.5 mg Oral Q6H PRN Lance Coon, MD       Or  . promethazine (PHENERGAN) injection 12.5 mg  12.5 mg Intravenous Q6H PRN Lance Coon, MD      . sertraline (ZOLOFT) tablet 50 mg  50 mg Oral Daily Lance Coon, MD   50 mg at 05/20/17 3762  . simvastatin (ZOCOR) tablet 40 mg  40 mg Oral Daily Lance Coon, MD   40 mg at 05/20/17 0917  . sodium chloride flush (NS) 0.9 % injection 3 mL  3 mL Intravenous Q12H Bettey Costa, MD   3 mL at 05/20/17 0918  . verapamil (CALAN-SR) CR tablet 240 mg  240 mg Oral Daily Lance Coon, MD   240 mg at 05/20/17  [id number]     Discharge Medications: Please see discharge summary for a list of discharge medications.  Relevant Imaging Results:  Relevant Lab Results:   Additional Information SS# [id number]  Zettie Pho, LCSW

## 2017-05-20 NOTE — Progress Notes (Signed)
Visiting with friends; asymptomatic. BP improved after NS fluid bolus of 569ml. Will continue to monitor.

## 2017-05-20 NOTE — Consult Note (Addendum)
[facility] for Electrolyte Management   Patient Measurements: Height: 5\' 6"  (167.6 cm) Weight: 161 lb 9.6 oz (73.3 kg)(bed scale) IBW/kg (Calculated) : 59.3 Intake/Output from previous day: 12/01 0701 - 12/02 0700 In: 320 [P.O.:320] Out: 300 [Urine:300] Intake/Output from this shift: Total I/O In: -  Out: 50 [Urine:50]  Potassium (mmol/L)  Date Value  05/20/2017 4.0   Magnesium (mg/dL)  Date Value  05/20/2017 1.7   Calcium (mg/dL)  Date Value  05/20/2017 8.2 (L)   Albumin (g/dL)  Date Value  05/17/2017 2.3 (L)   Phosphorus (mg/dL)  Date Value  05/19/2017 2.7  ] Labs: Estimated Creatinine Clearance: 69 mL/min (by C-G formula based on SCr of 0.67 mg/dL).   Assessment: Pharmacy consulted for electrolyte management in 66 yo female admitted with progressive weakness.  Current orders for KCl 20 mEq BID (home med). Torsemide stopped 11/29.  Pt received KCl 100 mEq PO and 20 mEq IV yesterday.   Goal of Therapy:  Electrolytes WNL  Plan:  K 3.9, Phos 2.7 No additional supplementation needed at this time.  Will check K and Mag in AM.   12/02 AM electrolyte panel WNL. Recheck BMP/Mg/PO4 with tomorrow AM labs.   Eloise Harman, PharmD, BCPS Clinical Pharmacist 05/20/2017 6:14 AM

## 2017-05-20 NOTE — Discharge Summary (Addendum)
[facility] [place] NAME: Jenna Carlson    MR#:  [id number]  DATE OF BIRTH:  11/09/1950  DATE OF ADMISSION:  05/16/2017 ADMITTING PHYSICIAN: Lance Coon, MD  DATE OF DISCHARGE: 05/22/2017  PRIMARY CARE PHYSICIAN: Kirk Ruths, MD    ADMISSION DIAGNOSIS:  Hypokalemia [E87.6] Generalized weakness [R53.1]  DISCHARGE DIAGNOSIS:  Principal Problem:   Weakness Active Problems:   Hypokalemia   Multiple duodenal ulcers   HTN (hypertension)   HLD (hyperlipidemia)   Diarrhea   SECONDARY DIAGNOSIS:   Past Medical History:  Diagnosis Date  . Allergic rhinitis   . Asthma   . Cancer (HCC)    BREAST  . Depression   . Edema   . GERD (gastroesophageal reflux disease)   . Hyperlipidemia   . Hypertension   . Osteoporosis, postmenopausal     HISTORY OF PRESENT ILLNESS:  Jenna Carlson  is a 66 y.o. female who presents with progressive weakness.  Patient has had chronic diarrhea and chronic duodenal ulcers, and states that she has had about a 70 pound weight loss over the last several months.  She has anorexia generally due to the symptoms of her ulcers.  She has been following with GI and they were trying to schedule MRI of her abdomen has the next step in diagnostic workup.  She came to the ED today because she was too weak to climb up her steps at home.  Evaluation here shows hypokalemia and hypomagnesemia.  She also had an elevated gastrin level.  Hospitalists were called for admission for electrolyte correction and further evaluation     HOSPITAL COURSE:   66 year old female with a history of duodenal ulcer who presents with generalized weakness.   1. Generalized weakness due to electrolyte abnormalities which may be in part due to weight loss, poor nutrition with severe malnutrition and torsemide Patient needs skilled nursing facility for further physical therapy. Electrolytes have been repleted  Stopped  torsemide.  2. Hypokalemia and hypomagnesemia: Replaced through IV and oral. Now  Normal.  3. History of duodenal ulcer: Continue PPI and H2 blocker  4. 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 Results will be followed by Dr. Vira Agar and Dr.Corchoron She will have follow-up with oncology as an outpatient.  5. Essential hypertension: Continueverapamiland coreg  6. Hyperlipidemia: Continue Zocor  7. Depression: Continue Zoloft  Stable for discharge to SNF     DISCHARGE CONDITIONS AND DIET:   Stable for discharge and regular diet  CONSULTS OBTAINED:  Treatment Team:  Lucilla Lame, MD Lequita Asal, MD  DRUG ALLERGIES:   Allergies  Allergen Reactions  . Iodinated Diagnostic Agents Shortness Of Breath  . Aspirin Tinitus  . Codeine   . Eggs Or Egg-Derived Products Other (See Comments)    On testing  . Hydrochlorothiazide Other (See Comments)  . Morphine And Related Nausea And Vomiting    DISCHARGE MEDICATIONS:   Allergies as of 05/22/2017      Reactions   Iodinated Diagnostic Agents Shortness Of Breath   Aspirin Tinitus   Codeine    Eggs Or Egg-derived Products Other (See Comments)   On testing   Hydrochlorothiazide Other (See Comments)   Morphine And Related Nausea And Vomiting      Medication List    STOP taking these medications   EPINEPHrine 0.3 mg/0.3 mL Soaj injection Commonly known as:  EPI-PEN   promethazine 12.5 MG tablet Commonly known as:  PHENERGAN   torsemide 20 MG tablet Commonly known as:  DEMADEX   trandolapril-verapamil 4-240 MG tablet Commonly known as:  TARKA     TAKE these medications   azelastine 0.1 % nasal spray Commonly known as:  ASTELIN Place 1 spray into both nostrils 2 (two) times daily. Use in each nostril as directed   BREO ELLIPTA 100-25 MCG/INH Aepb Generic drug:  fluticasone furoate-vilanterol Inhale 1 puff into the lungs daily.    carvedilol 25 MG tablet Commonly known as:  COREG Take 25 mg by mouth 2 (two) times daily with a meal.   cetirizine 10 MG tablet Commonly known as:  ZYRTEC Take 10 mg by mouth daily.   ergocalciferol 50000 units capsule Commonly known as:  VITAMIN D2 Take 50,000 Units by mouth once a week.   montelukast 10 MG tablet Commonly known as:  SINGULAIR Take 10 mg by mouth at bedtime.   omeprazole 40 MG capsule Commonly known as:  PRILOSEC Take 40 mg by mouth 2 (two) times daily.   ondansetron 4 MG disintegrating tablet Commonly known as:  ZOFRAN ODT Take 1 tablet (4 mg total) by mouth every 8 (eight) hours as needed for nausea or vomiting.   Potassium Chloride ER 20 MEQ Tbcr Take 20 mEq by mouth 2 (two) times daily.   ranitidine 150 MG capsule Commonly known as:  ZANTAC Take 150 mg by mouth 2 (two) times daily.   sertraline 50 MG tablet Commonly known as:  ZOLOFT Take 50 mg by mouth daily.   simvastatin 40 MG tablet Commonly known as:  ZOCOR Take 40 mg by mouth daily.   verapamil 240 MG CR tablet Commonly known as:  CALAN-SR Take 1 tablet (240 mg total) by mouth daily.         Today   CHIEF COMPLAINT:  Patient has generalized weakness. Diarrhea is improving   VITAL SIGNS:  Blood pressure 134/83, pulse 98, temperature 97.9 F (36.6 C), temperature source Oral, resp. rate 16, height 5\' 6"  (1.676 m), weight 73.3 kg (161 lb 9.6 oz), SpO2 97 %.   REVIEW OF SYSTEMS:  Review of Systems  Constitutional: Positive for malaise/fatigue. Negative for chills and fever.  HENT: Negative.  Negative for ear discharge, ear pain, hearing loss, nosebleeds and sore throat.   Eyes: Negative.  Negative for blurred vision and pain.  Respiratory: Negative.  Negative for cough, hemoptysis, shortness of breath and wheezing.   Cardiovascular: Negative.  Negative for chest pain, palpitations and leg swelling.  Gastrointestinal: Negative.  Negative for abdominal pain, blood in stool,  diarrhea, nausea and vomiting.  Genitourinary: Negative.  Negative for dysuria.  Musculoskeletal: Negative.  Negative for back pain.  Skin: Negative.   Neurological: Positive for weakness. Negative for dizziness, tremors, speech change, focal weakness, seizures and headaches.  Endo/Heme/Allergies: Negative.  Does not bruise/bleed easily.  Psychiatric/Behavioral: Negative.  Negative for depression, hallucinations and suicidal ideas.     PHYSICAL EXAMINATION:  GENERAL:  66 y.o.-year-old patient lying in the bed with no acute distress.  NECK:  Supple, no jugular venous distention. No thyroid enlargement, no tenderness.  LUNGS: Normal breath sounds bilaterally, no wheezing, rales,rhonchi  No use of accessory muscles of respiration.  CARDIOVASCULAR: S1, S2 normal. No murmurs, rubs, or gallops.  ABDOMEN: Soft, non-tender, non-distended. Bowel sounds present. No organomegaly or mass.  EXTREMITIES: No pedal edema, cyanosis, or clubbing.  PSYCHIATRIC: The patient is alert and oriented x 3.  SKIN: No obvious rash, lesion, or ulcer.   DATA REVIEW:  CBC Recent Labs  Lab 05/18/17 0421  WBC 7.7  HGB 10.9*  HCT 31.8*  PLT 212    Chemistries  Recent Labs  Lab 05/17/17 0424  05/21/17 0429  NA 138   < > 137  K 3.4*   < > 4.0  CL 108   < > 110  CO2 19*   < > 23  GLUCOSE 73   < > 91  BUN 5*   < > 9  CREATININE 0.72   < > 0.68  CALCIUM 7.8*   < > 8.5*  MG 1.4*   < > 1.8  AST 25  --   --   ALT 12*  --   --   ALKPHOS 54  --   --   BILITOT 1.2  --   --    < > = values in this interval not displayed.    Cardiac Enzymes No results for input(s): TROPONINI in the last 168 hours.  Microbiology Results  @MICRORSLT48 @  RADIOLOGY:  No results found.    Allergies as of 05/22/2017      Reactions   Iodinated Diagnostic Agents Shortness Of Breath   Aspirin Tinitus   Codeine    Eggs Or Egg-derived Products Other (See Comments)   On testing   Hydrochlorothiazide Other (See Comments)    Morphine And Related Nausea And Vomiting      Medication List    STOP taking these medications   EPINEPHrine 0.3 mg/0.3 mL Soaj injection Commonly known as:  EPI-PEN   promethazine 12.5 MG tablet Commonly known as:  PHENERGAN   torsemide 20 MG tablet Commonly known as:  DEMADEX   trandolapril-verapamil 4-240 MG tablet Commonly known as:  TARKA     TAKE these medications   azelastine 0.1 % nasal spray Commonly known as:  ASTELIN Place 1 spray into both nostrils 2 (two) times daily. Use in each nostril as directed   BREO ELLIPTA 100-25 MCG/INH Aepb Generic drug:  fluticasone furoate-vilanterol Inhale 1 puff into the lungs daily.   carvedilol 25 MG tablet Commonly known as:  COREG Take 25 mg by mouth 2 (two) times daily with a meal.   cetirizine 10 MG tablet Commonly known as:  ZYRTEC Take 10 mg by mouth daily.   ergocalciferol 50000 units capsule Commonly known as:  VITAMIN D2 Take 50,000 Units by mouth once a week.   montelukast 10 MG tablet Commonly known as:  SINGULAIR Take 10 mg by mouth at bedtime.   omeprazole 40 MG capsule Commonly known as:  PRILOSEC Take 40 mg by mouth 2 (two) times daily.   ondansetron 4 MG disintegrating tablet Commonly known as:  ZOFRAN ODT Take 1 tablet (4 mg total) by mouth every 8 (eight) hours as needed for nausea or vomiting.   Potassium Chloride ER 20 MEQ Tbcr Take 20 mEq by mouth 2 (two) times daily.   ranitidine 150 MG capsule Commonly known as:  ZANTAC Take 150 mg by mouth 2 (two) times daily.   sertraline 50 MG tablet Commonly known as:  ZOLOFT Take 50 mg by mouth daily.   simvastatin 40 MG tablet Commonly known as:  ZOCOR Take 40 mg by mouth daily.   verapamil 240 MG CR tablet Commonly known as:  CALAN-SR Take 1 tablet (240 mg total) by mouth daily.         Management plans discussed with the patient and she is in agreement. Stable for discharge SNF  Patient should follow up with oncology  CODE  STATUS:     Code Status Orders  (From admission, onward)        Start     Ordered   05/17/17 0032  Full code  Continuous     05/17/17 0034    Code Status History    Date Active Date Inactive Code Status Order ID Comments User Context   This patient has a current code status but no historical code status.    Advance Directive Documentation     Most Recent Value  Type of Advance Directive  Living will  Pre-existing out of facility DNR order (yellow form or pink MOST form)  No data  "MOST" Form in Place?  No data      TOTAL TIME TAKING CARE OF THIS PATIENT: 38 minutes.    Note: This dictation was prepared with Dragon dictation along with smaller phrase technology. Any transcriptional errors that result from this process are unintentional.  Neita Carp M.D on 05/22/2017 at 1:39 PM  Between 7am to 6pm - Pager - [phone]  After 6pm go to [email or web address] - password [email or web address] [facility]  Office  [phone]  CC: Primary care physician; Kirk Ruths, MD

## 2017-05-20 NOTE — Progress Notes (Signed)
[facility] at [place] NAME: Jenna Carlson    MR#:  [id number]  DATE OF BIRTH:  08-16-1950  SUBJECTIVE:   Patient doing  Better this am  REVIEW OF SYSTEMS:    Review of Systems  Constitutional: Negative for fever, chills++  weight loss and generalized weakness HENT: Negative for ear pain, nosebleeds, congestion, facial swelling, rhinorrhea, neck pain, neck stiffness and ear discharge.   Respiratory: Negative for cough, shortness of breath, wheezing  Cardiovascular: Negative for chest pain, palpitations and leg swelling.  Gastrointestinal: Negative for heartburn, abdominal pain, vomiting, no diarrhea overnight Genitourinary: Negative for dysuria, urgency, frequency, hematuria Musculoskeletal: Negative for back pain or joint pain Neurological: Negative for dizziness, seizures, syncope, focal weakness,  numbness and headaches.  Hematological: Does not bruise/bleed easily.  Psychiatric/Behavioral: Negative for hallucinations, confusion, dysphoric mood    Tolerating Diet: yes      DRUG ALLERGIES:   Allergies  Allergen Reactions  . Iodinated Diagnostic Agents Shortness Of Breath  . Aspirin Tinitus  . Codeine   . Eggs Or Egg-Derived Products Other (See Comments)    On testing  . Hydrochlorothiazide Other (See Comments)  . Morphine And Related Nausea And Vomiting    VITALS:  Blood pressure (!) 143/85, pulse 94, temperature 98.2 F (36.8 C), temperature source Oral, resp. rate 20, height 5\' 6"  (1.676 m), weight 73.3 kg (161 lb 9.6 oz), SpO2 99 %.  PHYSICAL EXAMINATION:  Constitutional: Appears well-developed and well-nourished. No distress. HENT: Normocephalic. Marland Kitchen Oropharynx is clear and moist.  Eyes: Conjunctivae and EOM are normal. PERRLA, no scleral icterus.  Neck: Normal ROM. Neck supple. No JVD. No tracheal deviation. CVS: RRR, S1/S2 +, no murmurs, no gallops, no carotid bruit.  Pulmonary: Effort and breath sounds normal, no  stridor, rhonchi, wheezes, rales.  Abdominal: Soft. BS +,  no distension, tenderness, rebound or guarding.  Musculoskeletal: Normal range of motion. No edema and no tenderness.  Neuro: Alert. CN 2-12 grossly intact. No focal deficits. Skin: Skin is warm and dry. No rash noted. Psychiatric: Normal mood and affect.      LABORATORY PANEL:   CBC Recent Labs  Lab 05/18/17 0421  WBC 7.7  HGB 10.9*  HCT 31.8*  PLT 212   ------------------------------------------------------------------------------------------------------------------  Chemistries  Recent Labs  Lab 05/17/17 0424  05/20/17 0533  NA 138   < > 138  K 3.4*   < > 4.0  CL 108   < > 111  CO2 19*   < > 22  GLUCOSE 73   < > 88  BUN 5*   < > 5*  CREATININE 0.72   < > 0.67  CALCIUM 7.8*   < > 8.2*  MG 1.4*   < > 1.7  AST 25  --   --   ALT 12*  --   --   ALKPHOS 54  --   --   BILITOT 1.2  --   --    < > = values in this interval not displayed.   ------------------------------------------------------------------------------------------------------------------  Cardiac Enzymes No results for input(s): TROPONINI in the last 168 hours. ------------------------------------------------------------------------------------------------------------------  RADIOLOGY:  No results found.   ASSESSMENT AND PLAN:   67 year old female with a history of duodenal ulcer who presents to generalized weakness.   1. Generalized weakness due to electrolyte abnormalities which may be in part due to weight loss, poor nutrition with severe malnutrition and torsemide Physical therapy consultationis recommending skilled nursing facility at discharge.  Electrolytes  have been repleted  She will discontinue torsemide.    2. Hypokalemia and hypomagnesemia:Electrolytes are within normal limits today  Stop torsemide 3. History of duodenal ulcer: Continue PPI and H2 blocker  4. 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 Patient is medically stable for discharge and this can be performed as an outpatient.  She needs 24-hour urine collection to be done at skilled nursing facility She needs anti-emetics prior to the scan. She will have follow-up with oncology as an outpatient.   5. Essential hypertension: Continue verapamil  and coreg  6. Hyperlipidemia: Continue Zocor  7. Depression: Continue Zoloft  D/w dr Mike Gip  Management plans discussed with the patient and she is in agreement.   CODE STATUS: full  TOTAL TIME TAKING CARE OF THIS PATIENT: 24 minutes.     POSSIBLE D/C Today depending on insurance.   Dixon Luczak M.D on 05/20/2017 at 9:41 AM  Between 7am to 6pm - Pager - [phone] After 6pm go to [email or web address] - password [email or web address] [facility]  Office  [phone]  CC: Primary care physician; Kirk Ruths, MD  Note: This dictation was prepared with Dragon dictation along with smaller phrase technology. Any transcriptional errors that result from this process are unintentional.

## 2017-05-20 NOTE — Progress Notes (Signed)
Physical Therapy Treatment Patient Details Name: Jenna Carlson MRN: [id number] DOB: Feb 04, 1951 Today's Date: 05/20/2017    History of Present Illness Jenna Carlson is a 66yo white female who presents to ER secondary to progressive weakness, inability to mobilize in home environment; admitted with generalized weakness secondary to electrolyte abnormality (hypokalemia, hypomagnesia).  Pending work up for possible gastrinoma.    PT Comments    Session focused on funcitonal mobility to improve confidence and safety for return to home. Bed mobility is more labored, while AMB and transfers remain without much change, distances limited to less than 25 feet without exertional tachycardia and fatigue in BUE and BLE. Gait speed is not appropriate for safe DC to home at this time. Stairs are not appropriate for training at this time d/t current level of impairment, a point of concern as patient was unable to ascend steps at egress of home the day PTA. Due to continued low level of functional ability, limited support at home, inability to access her home environment, and elevated falls risk, PT recommendations for DC are updated to STR/SNF. This will be a better option for full and rapid return to PLOF as patient was fully independent at her baseline.    Follow Up Recommendations  SNF;Supervision for mobility/OOB     Equipment Recommendations  (TBD by facility)    Recommendations for Other Services       Precautions / Restrictions Precautions Precautions: Fall Restrictions Weight Bearing Restrictions: No    Mobility  Bed Mobility Overal bed mobility: Modified Independent             General bed mobility comments: maximal effort required, significant trunk and BUE weakness  Transfers Overall transfer level: Needs assistance Equipment used: Rolling walker (2 wheeled)(mostly chair arms ) Transfers: Sit to/from Stand Sit to Stand: Min guard         General transfer comment: heavy  effort required  Ambulation/Gait Ambulation/Gait assistance: Min guard Ambulation Distance (Feet): 20 Feet(2x16ft, rest between) Assistive device: Rolling walker (2 wheeled) Gait Pattern/deviations: Step-to pattern Gait velocity: <0.74m/s    General Gait Details: rests intermittently, increased DOE, after 29ft HR: 112bpm. Pt reports fatigue in arms and legs   Stairs Stairs: (unable to attempt due to weakness and exhaustion with short distance AMB, not appropraite at this time. )          Wheelchair Mobility    Modified Rankin (Stroke Patients Only)       Balance Overall balance assessment: Needs assistance   Sitting balance-Leahy Scale: Good     Standing balance support: Bilateral upper extremity supported;During functional activity Standing balance-Leahy Scale: Fair                              Cognition Arousal/Alertness: Awake/alert Behavior During Therapy: WFL for tasks assessed/performed Overall Cognitive Status: Within Functional Limits for tasks assessed                                        Exercises      General Comments        Pertinent Vitals/Pain Pain Assessment: No/denies pain    Home Living                      Prior Function            PT Goals (  current goals can now be found in the care plan section) Acute Rehab PT Goals Patient Stated Goal: to build strength up PT Goal Formulation: With patient Time For Goal Achievement: 06/01/17 Potential to Achieve Goals: Good Progress towards PT goals: Not progressing toward goals - comment    Frequency    Min 2X/week      PT Plan Discharge plan needs to be updated    Co-evaluation              AM-PAC PT "6 Clicks" Daily Activity  Outcome Measure  Difficulty turning over in bed (including adjusting bedclothes, sheets and blankets)?: A Little Difficulty moving from lying on back to sitting on the side of the bed? : A Lot Difficulty  sitting down on and standing up from a chair with arms (e.g., wheelchair, bedside commode, etc,.)?: A Lot Help needed moving to and from a bed to chair (including a wheelchair)?: A Little Help needed walking in hospital room?: A Little Help needed climbing 3-5 steps with a railing? : Total 6 Click Score: 14    End of Session Equipment Utilized During Treatment: Gait belt Activity Tolerance: Patient tolerated treatment well;Patient limited by fatigue Patient left: with nursing/sitter in room(in BR with RN for urine collection) Nurse Communication: Mobility status(BUE trmors) PT Visit Diagnosis: Muscle weakness (generalized) (M62.81);Difficulty in walking, not elsewhere classified (R26.2)     Time: 8110-3159 PT Time Calculation (min) (ACUTE ONLY): 16 min  Charges:  $Therapeutic Activity: 8-22 mins                    G Codes:       9:29 AM, 29-May-2017 Jenna Carlson, PT, DPT Physical Therapist - [place] [phone] ([place])  [phone] (mobile)    Jenna Carlson C 05-29-2017, 9:25 AM

## 2017-05-21 ENCOUNTER — Inpatient Hospital Stay: Payer: Medicare Other

## 2017-05-21 ENCOUNTER — Encounter: Payer: Self-pay | Admitting: Radiology

## 2017-05-21 LAB — BASIC METABOLIC PANEL
ANION GAP: 4 — AB (ref 5–15)
BUN: 9 mg/dL (ref 6–20)
CHLORIDE: 110 mmol/L (ref 101–111)
CO2: 23 mmol/L (ref 22–32)
Calcium: 8.5 mg/dL — ABNORMAL LOW (ref 8.9–10.3)
Creatinine, Ser: 0.68 mg/dL (ref 0.44–1.00)
GFR calc Af Amer: >60 mL/min (ref >60)
Glucose, Bld: 91 mg/dL (ref 65–99)
POTASSIUM: 4 mmol/L (ref 3.5–5.1)
SODIUM: 137 mmol/L (ref 135–145)

## 2017-05-21 LAB — MAGNESIUM: MAGNESIUM: 1.8 mg/dL (ref 1.7–2.4)

## 2017-05-21 LAB — PHOSPHORUS: PHOSPHORUS: 2.6 mg/dL (ref 2.5–4.6)

## 2017-05-21 MED ORDER — ATORVASTATIN CALCIUM 20 MG PO TABS
20.0000 mg | ORAL_TABLET | Freq: Every day | ORAL | Status: DC
  Administered 2017-05-21 – 2017-05-22 (×2): 20 mg via ORAL
  Filled 2017-05-21 (×2): qty 1

## 2017-05-21 MED ORDER — INDIUM IN-111 PENTETREOTIDE IV KIT
6.3670 | PACK | Freq: Once | INTRAVENOUS | Status: AC | PRN
  Administered 2017-05-21: 6.367 via INTRAVENOUS

## 2017-05-21 NOTE — Progress Notes (Signed)
PT Cancellation Note  Patient Details Name: Jenna Carlson MRN: [id number] DOB: 31-May-1951   Cancelled Treatment:    Reason Eval/Treat Not Completed: Patient declined, no reason specified. Treatment attempted; pt refuses despite encouragement due to feeling too tired and weak. Re attempt at a later date.    Larae Grooms, PTA 05/21/2017, 3:56 PM

## 2017-05-21 NOTE — Clinical Social Work Note (Signed)
CSW received [facility] number [id number].  CSW to continue to follow, patient's progress throughout discharge planning.  Jones Broom. [place], MSW, [place]  05/21/2017 9:50 AM

## 2017-05-21 NOTE — Plan of Care (Signed)
  Progressing Education: Knowledge of General Education information will improve 05/21/2017 0842 - Progressing by Rowe Robert, RN Health Behavior/Discharge Planning: Ability to manage health-related needs will improve 05/21/2017 0842 - Progressing by Rowe Robert, RN Clinical Measurements: Ability to maintain clinical measurements within normal limits will improve 05/21/2017 0842 - Progressing by Rowe Robert, RN Will remain free from infection 05/21/2017 0842 - Progressing by Rowe Robert, RN Diagnostic test results will improve 05/21/2017 0842 - Progressing by Rowe Robert, RN Respiratory complications will improve 05/21/2017 0842 - Progressing by Rowe Robert, RN Cardiovascular complication will be avoided 05/21/2017 0842 - Progressing by Rowe Robert, RN Activity: Risk for activity intolerance will decrease 05/21/2017 0842 - Progressing by Rowe Robert, RN Nutrition: Adequate nutrition will be maintained 05/21/2017 0842 - Progressing by Rowe Robert, RN Coping: Level of anxiety will decrease 05/21/2017 0842 - Progressing by Rowe Robert, RN Elimination: Will not experience complications related to bowel motility 05/21/2017 0842 - Progressing by Rowe Robert, RN Will not experience complications related to urinary retention 05/21/2017 0842 - Progressing by Rowe Robert, RN Pain Managment: General experience of comfort will improve 05/21/2017 0842 - Progressing by Rowe Robert, RN Safety: Ability to remain free from injury will improve 05/21/2017 0842 - Progressing by Rowe Robert, RN Skin Integrity: Risk for impaired skin integrity will decrease 05/21/2017 0842 - Progressing by Rowe Robert, RN Cardiac: Ability to achieve and maintain adequate cardiopulmonary perfusion will improve 05/21/2017 0842 - Progressing by Rowe Robert, RN Spiritual Needs Ability to function at adequate level 05/21/2017 0842 - Progressing by Rowe Robert, RN

## 2017-05-21 NOTE — Clinical Social Work Note (Signed)
CSW spoke to patient and presented bed offers she chose [facility].  CSW spoke to [facility] who can accept patient once she is medically ready for discharge and orders have been received.  CSW explained to patient what to expect at SNF and how insurance will pay for her stay.  Jones Broom. [place], MSW, [place]  05/21/2017 3:03 PM

## 2017-05-21 NOTE — Progress Notes (Signed)
Nutrition Follow-up  DOCUMENTATION CODES:   Severe malnutrition in context of chronic illness  INTERVENTION:  Continue Ensure Enlive po TID, each supplement provides 350 kcal and 20 grams of protein.  Continue daily MVI.  NUTRITION DIAGNOSIS:   Severe Malnutrition related to chronic illness(ulcers, diarrhea, N/V) as evidenced by percent weight loss, moderate fat depletion, severe fat depletion, moderate muscle depletion, severe muscle depletion.  Ongoing.  GOAL:   Patient will meet greater than or equal to 90% of their needs  Progressing.  MONITOR:   PO intake, Supplement acceptance, Labs, Weight trends, I & O's  REASON FOR ASSESSMENT:   Malnutrition Screening Tool, Consult Assessment of nutrition requirement/status, Poor PO  ASSESSMENT:   66 year old female with PMHx of asthma, breast cancer s/p mastectomy, GERD, HTN, HLD, OP, depression who presented with progressive weakness, anorexia, chronic diarrhea and duodenal ulcers, weight loss admitted for work-up.  -Per chart plan is for Octreoscan tomorrow prior to discharge.  Patient reports her appetite is improving. She feels she is eating almost back to her baseline (meal completion 70-100% mainly). She reports she enjoys the Ensure and would like to keep drinking them. She is no longer having diarrhea and her nausea has improved. She reports she will be likely discharging tomorrow to SNF for PT.  Medications reviewed and include: MVI daily, pantoprazole, potassium chloride 20 mEq BID.  Labs reviewed: Anion gap 4. Potassium, Magnesium, and Phosphorus have been WNL.  No subsequent weights to trend.  Diet Order:  Diet regular Room service appropriate? Yes; Fluid consistency: Thin  EDUCATION NEEDS:   Education needs have been addressed  Skin:  Skin Assessment: Reviewed RN Assessment(ecchymosis, extravasation)  Last BM:  05/21/2017 - small type 4  Height:   Ht Readings from Last 1 Encounters:  05/16/17 5\' 6"   (1.676 m)    Weight:   Wt Readings from Last 1 Encounters:  05/17/17 161 lb 9.6 oz (73.3 kg)    Ideal Body Weight:  59.1 kg  BMI:  Body mass index is 26.08 kg/m.  Estimated Nutritional Needs:   Kcal:  1670-1930 (MSJ x 1.3-1.5)  Protein:  95-110 grams (1.3-1.5 grams/kg)  Fluid:  1.8-2.2 L/day (25-30 mL/kg)  Willey Blade, MS, RD, LDN Office: [phone] Pager: [phone] After Hours/Weekend Pager: [phone]

## 2017-05-21 NOTE — Progress Notes (Signed)
[facility] [place] NAME: Jenna Carlson    MR#:  [id number]  DATE OF BIRTH:  03-Oct-1950  SUBJECTIVE:   No diarrhea today.  No abdominal pain.  REVIEW OF SYSTEMS:    Review of Systems  Constitutional: Negative for fever, chills++  weight loss and generalized weakness HENT: Negative for ear pain, nosebleeds, congestion, facial swelling, rhinorrhea, neck pain, neck stiffness and ear discharge.   Respiratory: Negative for cough, shortness of breath, wheezing  Cardiovascular: Negative for chest pain, palpitations and leg swelling.  Gastrointestinal: Negative for heartburn, abdominal pain, vomiting, no diarrhea overnight Genitourinary: Negative for dysuria, urgency, frequency, hematuria Musculoskeletal: Negative for back pain or joint pain Neurological: Negative for dizziness, seizures, syncope, focal weakness,  numbness and headaches.  Hematological: Does not bruise/bleed easily.  Psychiatric/Behavioral: Negative for hallucinations, confusion, dysphoric mood   DRUG ALLERGIES:   Allergies  Allergen Reactions  . Iodinated Diagnostic Agents Shortness Of Breath  . Aspirin Tinitus  . Codeine   . Eggs Or Egg-Derived Products Other (See Comments)    On testing  . Hydrochlorothiazide Other (See Comments)  . Morphine And Related Nausea And Vomiting    VITALS:  Blood pressure 110/65, pulse 82, temperature 98.2 F (36.8 C), temperature source Oral, resp. rate 18, height 5\' 6"  (1.676 m), weight 73.3 kg (161 lb 9.6 oz), SpO2 95 %.  PHYSICAL EXAMINATION:  Constitutional: Appears well-developed and well-nourished. No distress. HENT: Normocephalic. Marland Kitchen Oropharynx is clear and moist.   Eyes: Conjunctivae and EOM are normal. PERRLA, no scleral icterus.  Neck: Normal ROM. Neck supple. No JVD. No tracheal deviation. CVS: RRR, S1/S2 +, no murmurs, no gallops, no carotid bruit.  Pulmonary: Effort and breath sounds normal, no stridor, rhonchi, wheezes,  rales.  Abdominal: Soft. BS +,  no distension, tenderness, rebound or guarding.  Musculoskeletal: Normal range of motion. No edema and no tenderness.  Neuro: Alert. CN 2-12 grossly intact. No focal deficits. Skin: Skin is warm and dry. No rash noted. Psychiatric: Normal mood and affect.   LABORATORY PANEL:   CBC Recent Labs  Lab 05/18/17 0421  WBC 7.7  HGB 10.9*  HCT 31.8*  PLT 212   ------------------------------------------------------------------------------------------------------------------  Chemistries  Recent Labs  Lab 05/17/17 0424  05/21/17 0429  NA 138   < > 137  K 3.4*   < > 4.0  CL 108   < > 110  CO2 19*   < > 23  GLUCOSE 73   < > 91  BUN 5*   < > 9  CREATININE 0.72   < > 0.68  CALCIUM 7.8*   < > 8.5*  MG 1.4*   < > 1.8  AST 25  --   --   ALT 12*  --   --   ALKPHOS 54  --   --   BILITOT 1.2  --   --    < > = values in this interval not displayed.   ------------------------------------------------------------------------------------------------------------------  Cardiac Enzymes No results for input(s): TROPONINI in the last 168 hours. ------------------------------------------------------------------------------------------------------------------  RADIOLOGY:  No results found.   ASSESSMENT AND PLAN:   66 year old female with a history of duodenal ulcer who presents with generalized weakness.   1. Generalized weakness due to electrolyte abnormalities which may be in part due to weight loss, poor nutrition with severe malnutrition and torsemide Patient needs skilled nursing facility for further physical therapy. Electrolytes have been repleted  Stop torsemide.    2. Hypokalemia and  hypomagnesemia: Replaced through IV and oral.  Normal.  3. History of duodenal ulcer: Continue PPI and H2 blocker  4. 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  She will have  follow-up with oncology as an outpatient.  5. Essential hypertension: Continue verapamil  and coreg  6. Hyperlipidemia: Continue Zocor  7. Depression: Continue Zoloft  Management plans discussed with the patient and she is in agreement.  CODE STATUS: Full  TOTAL TIME TAKING CARE OF THIS PATIENT: 24 minutes.   Discharge to skilled nursing facility tomorrow after the octreotide scan   Neita Carp M.D on 05/21/2017 at 2:11 PM  Between 7am to 6pm - Pager - [phone]  After 6pm go to [email or web address] - password [email or web address] [facility]  Office  [phone]  CC: Primary care physician; Kirk Ruths, MD  Note: This dictation was prepared with Dragon dictation along with smaller phrase technology. Any transcriptional errors that result from this process are unintentional.

## 2017-05-21 NOTE — Consult Note (Signed)
[place] for Electrolyte Management   Patient Measurements: Height: 5\' 6"  (167.6 cm) Weight: 161 lb 9.6 oz (73.3 kg)(bed scale) IBW/kg (Calculated) : 59.3 Intake/Output from previous day: 12/02 0701 - 12/03 0700 In: 1140 [P.O.:640; IV Piggyback:500] Out: 900 [Urine:900] Intake/Output from this shift: Total I/O In: -  Out: 400 [Urine:400]  Potassium (mmol/L)  Date Value  05/21/2017 4.0   Magnesium (mg/dL)  Date Value  05/21/2017 1.8   Calcium (mg/dL)  Date Value  05/21/2017 8.5 (L)   Albumin (g/dL)  Date Value  05/17/2017 2.3 (L)   Phosphorus (mg/dL)  Date Value  05/21/2017 2.6  ] Labs: Estimated Creatinine Clearance: 69 mL/min (by C-G formula based on SCr of 0.68 mg/dL).   Assessment: Pharmacy consulted for electrolyte management in 66 yo female admitted with progressive weakness.  Current orders for KCl 20 mEq BID (home med). Torsemide stopped 11/29.  Pt received KCl 100 mEq PO and 20 mEq IV yesterday.   Goal of Therapy:  Electrolytes WNL  Plan:  K 3.9, Phos 2.7 No additional supplementation needed at this time.  Will check K and Mag in AM.   12/02 AM electrolyte panel WNL. Recheck BMP/Mg/PO4 with tomorrow AM labs.  12/3 electrolytes WNL. Patient on KCL 48meq PO bid. Will recheck electrolytes in 2 days on 12/5.   Noralee Space, PharmD, BCPS Clinical Pharmacist 05/21/2017 9:46 AM

## 2017-05-22 ENCOUNTER — Inpatient Hospital Stay: Payer: Medicare Other

## 2017-05-22 LAB — CHROMOGRANIN A: Chromogranin A: 9 nmol/L — ABNORMAL HIGH (ref 0–5)

## 2017-05-22 NOTE — Progress Notes (Signed)
Physical Therapy Treatment Patient Details Name: Jenna Carlson MRN: [id number] DOB: 01/19/1951 Today's Date: 05/22/2017    History of Present Illness Devlin [place] is a 66yo white female who presents to ER secondary to progressive weakness, inability to mobilize in home environment; admitted with generalized weakness secondary to electrolyte abnormality (hypokalemia, hypomagnesia).  Pending work up for possible gastrinoma.    PT Comments    Pt agreeable to PT; denies pain. Pt progressing all mobility, but continues well below her baseline. Pt notes she was seeing an exercise trainer twice a week. Exercises and mobility much more limited at this point; ambulating 30 feet with fatigue and below baseline quality. Pt has good understanding of exercises based on previous experience and performs without assist. Cues to incorporate abdominal isolation during exercises as well. Continue PT to progress strength and endurance to improve all functional mobility and return towards prior level of function.    Follow Up Recommendations  SNF     Equipment Recommendations  None recommended by PT(has rw)    Recommendations for Other Services       Precautions / Restrictions Precautions Precautions: Fall Restrictions Weight Bearing Restrictions: No    Mobility  Bed Mobility Overal bed mobility: Modified Independent             General bed mobility comments: Improved speed; less effortful  Transfers Overall transfer level: Needs assistance Equipment used: Rolling walker (2 wheeled) Transfers: Sit to/from Stand Sit to Stand: Supervision         General transfer comment: Good use of hands, mild slow to rise, but no unsteadiness  Ambulation/Gait Ambulation/Gait assistance: Min guard Ambulation Distance (Feet): 30 Feet Assistive device: Rolling walker (2 wheeled) Gait Pattern/deviations: Step-through pattern(partial step through)   Gait velocity interpretation: Below normal speed  for age/gender General Gait Details: slower than baseline. No LOB. Improved fluidity   Stairs            Wheelchair Mobility    Modified Rankin (Stroke Patients Only)       Balance Overall balance assessment: Needs assistance Sitting-balance support: Feet supported;No upper extremity supported;Bilateral upper extremity supported Sitting balance-Leahy Scale: Good     Standing balance support: Bilateral upper extremity supported Standing balance-Leahy Scale: Fair                              Cognition Arousal/Alertness: Awake/alert Behavior During Therapy: WFL for tasks assessed/performed Overall Cognitive Status: Within Functional Limits for tasks assessed                                        Exercises General Exercises - Lower Extremity Ankle Circles/Pumps: AROM;Both;20 reps Quad Sets: Strengthening;Both;Other reps (comment) Gluteal Sets: Strengthening;Both;20 reps Long Arc Quad: AROM;Both;20 reps;Seated Heel Slides: AROM;Both;20 reps Hip ABduction/ADduction: AROM;Both;20 reps Hip Flexion/Marching: AROM;Both;20 reps;Seated Toe Raises: AROM;Both;20 reps;Seated Heel Raises: AROM;Both;20 reps;Seated Other Exercises Other Exercises: Abdominal isolation with/without other exercises Other Exercises: verbal instruction of bridge. Pt aware of exercise and has performed    General Comments        Pertinent Vitals/Pain Pain Assessment: No/denies pain    Home Living                      Prior Function            PT Goals (current goals can now  be found in the care plan section) Progress towards PT goals: Progressing toward goals    Frequency    Min 2X/week      PT Plan Current plan remains appropriate    Co-evaluation              AM-PAC PT "6 Clicks" Daily Activity  Outcome Measure  Difficulty turning over in bed (including adjusting bedclothes, sheets and blankets)?: A Little Difficulty moving from  lying on back to sitting on the side of the bed? : A Little Difficulty sitting down on and standing up from a chair with arms (e.g., wheelchair, bedside commode, etc,.)?: A Little Help needed moving to and from a bed to chair (including a wheelchair)?: A Little Help needed walking in hospital room?: A Little Help needed climbing 3-5 steps with a railing? : A Lot 6 Click Score: 17    End of Session   Activity Tolerance: Patient tolerated treatment well Patient left: in chair;with call bell/phone within reach;with chair alarm set   PT Visit Diagnosis: Muscle weakness (generalized) (M62.81);Difficulty in walking, not elsewhere classified (R26.2)     Time: 9798-9211 PT Time Calculation (min) (ACUTE ONLY): 38 min  Charges:  $Gait Training: 8-22 mins $Therapeutic Exercise: 23-37 mins                    G Codes:        Larae Grooms, PTA 05/22/2017, 10:26 AM

## 2017-05-22 NOTE — Clinical Social Work Note (Signed)
Patient to be d/c'ed today to [facility].  Patient and family agreeable to plans will transport via ems RN to call report to [phone] room E17.  Evette Cristal, MSW, [place]

## 2017-05-22 NOTE — Progress Notes (Signed)
Called report to Connye Burkitt RN at [facility]

## 2017-05-24 ENCOUNTER — Other Ambulatory Visit
Admission: RE | Admit: 2017-05-24 | Discharge: 2017-05-24 | Disposition: A | Discharge status: Home / Self Care | Payer: Medicare Other | Source: Ambulatory Visit | Attending: Hematology and Oncology | Admitting: Hematology and Oncology

## 2017-05-24 DIAGNOSIS — E876 Hypokalemia: Secondary | ICD-10-CM | POA: Diagnosis present

## 2017-05-28 LAB — 5 HIAA, QUANTITATIVE, URINE, 24 HOUR
5-HIAA, Ur: 1.8 mg/L
5-HIAA,Quant.,24 Hr Urine: 1.4 mg/24 hr (ref 0.0–14.9)
Total Volume: 800

## 2017-05-30 LAB — VASOACTIVE INTESTINAL PEPTIDE (VIP): Vasoactive Intest Polypeptide: 28.3 pg/mL (ref 0.0–58.8)

## 2017-05-31 ENCOUNTER — Telehealth: Payer: Self-pay | Admitting: *Deleted

## 2017-05-31 NOTE — Telephone Encounter (Signed)
-----   Message from Livia Snellen, RN sent at 05/29/2017  8:37 AM EST ----- Regarding: FW: Please repeat 5HIAA 24 hour urine Rodena Piety, I called this patient and left a voice mail message for her to call me back, but she never called back...will you try to call her and let her know that we need to repeat this test?  Thanks, Doni ----- Message ----- From: Karen Kitchens, NP Sent: 05/24/2017  11:55 AM To: Livia Snellen, RN Subject: FW: Please repeat 5HIAA 24 hour urine            ----- Message ----- From: Lequita Asal, MD Sent: 05/24/2017  11:52 AM To: Karen Kitchens, NP Subject: Please repeat 5HIAA 24 hour urine                ----- Message ----- From: Interface, Lab In [facility] Sent: 05/24/2017  11:40 AM To: Lequita Asal, MD

## 2017-05-31 NOTE — Telephone Encounter (Signed)
Called patient and LVM that 24 hour urine needs to be repeated.  Asked her for a call back.

## 2017-06-01 ENCOUNTER — Encounter: Payer: Self-pay | Admitting: Hematology and Oncology

## 2017-06-01 ENCOUNTER — Other Ambulatory Visit: Payer: Self-pay

## 2017-06-01 ENCOUNTER — Inpatient Hospital Stay: Payer: Medicare Other | Attending: Hematology and Oncology | Admitting: Hematology and Oncology

## 2017-06-01 VITALS — BP 129/85 | HR 91 | Temp 98.2°F | Resp 20 | Wt 157.2 lb

## 2017-06-01 DIAGNOSIS — K219 Gastro-esophageal reflux disease without esophagitis: Secondary | ICD-10-CM | POA: Insufficient documentation

## 2017-06-01 DIAGNOSIS — J45909 Unspecified asthma, uncomplicated: Secondary | ICD-10-CM | POA: Diagnosis not present

## 2017-06-01 DIAGNOSIS — K269 Duodenal ulcer, unspecified as acute or chronic, without hemorrhage or perforation: Secondary | ICD-10-CM | POA: Diagnosis not present

## 2017-06-01 DIAGNOSIS — E785 Hyperlipidemia, unspecified: Secondary | ICD-10-CM | POA: Insufficient documentation

## 2017-06-01 DIAGNOSIS — R197 Diarrhea, unspecified: Secondary | ICD-10-CM | POA: Diagnosis not present

## 2017-06-01 DIAGNOSIS — R112 Nausea with vomiting, unspecified: Secondary | ICD-10-CM | POA: Insufficient documentation

## 2017-06-01 DIAGNOSIS — Z79899 Other long term (current) drug therapy: Secondary | ICD-10-CM | POA: Insufficient documentation

## 2017-06-01 DIAGNOSIS — K5730 Diverticulosis of large intestine without perforation or abscess without bleeding: Secondary | ICD-10-CM | POA: Diagnosis present

## 2017-06-01 DIAGNOSIS — I10 Essential (primary) hypertension: Secondary | ICD-10-CM | POA: Diagnosis not present

## 2017-06-01 DIAGNOSIS — E164 Increased secretion of gastrin: Secondary | ICD-10-CM | POA: Insufficient documentation

## 2017-06-01 DIAGNOSIS — K222 Esophageal obstruction: Secondary | ICD-10-CM | POA: Diagnosis not present

## 2017-06-01 DIAGNOSIS — F329 Major depressive disorder, single episode, unspecified: Secondary | ICD-10-CM | POA: Insufficient documentation

## 2017-06-01 NOTE — Progress Notes (Signed)
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  She was seen by Dr. Mike Gip in consultation.

## 2017-06-01 NOTE — Progress Notes (Signed)
[facility] day:  06/01/2017  Chief Complaint: Jenna Carlson is a 66 y.o. 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  HPI:  The patient states that she was in her usual state of good health until 11/2016.  She describes eating bad rice at a restaurant. Since then she has had chronic diarrhea.  Diarrhea has been off and on.  Stool is described as putting like up to 3-4 times a day. Minimal she'll have one stool a day. She denies any blood or mucus. Appetite has been poor. She has lost 74 pounds since 09/2016 (20 pounds since 04/03/2017).  She has been followed in the GI Clinic at [facility] by Dr. Gaylyn Cheers.  GI panel, C diff, pancreatic elastase, and calprotectin were normal in 12/2016.  Abdomen and pelvic CT without contrast on 01/23/2017 was unremakable.    Colonoscopy on 02/16/2017 revealed small TA in ascending colon, sigmoid/descending diverticulosis.  EGD on 02/16/2017 revealed moderate Schatzki ring (dilated), gastritis, four non-bleeding crated and superficial duodenal ulcers within duodenal bulb.  She describes symptoms of nausea, reflux, diarrhea, and weight loss.  She denies any flushing.  She has been on omeprazole since 02/2017.  She is also on Carafate.  Gastrin level was 262 (0-115) on 05/16/2017.  TSH and free T4 were normal on 05/15/2017.  Abdomen MRI with and without contrast on 05/17/2017 revealed a nonspecific tiny 0.4 cm focus of arterial phase hyperenhancement at the pancreaticoduodenal groove just above the ampulla. Tiny gastrinoma was not excluded. Consider correlation with Octreoscan.  Otherwise, there was no discrete pancreatic or duodenal mass.  there was no abdominal lymphadenopathy.  There were no suspicious liver masses.  Octreotide scan on 05/22/2017 revealed no scintigraphic evidence of somatostatin receptor avid neoplasm.  She was admitted to [facility] from  05/16/2017 - 05/22/2017.  She presented with generalized weakness.  She received IVF and electrolyte replacement. Vasoactive intetinal polypeptide (VIP) was 28.3 (0-58.8).  Chromogranin A was 9 (0-5).  5-HIAA quantitative 24 hour urine on 05/20/2017 was 1.4 mg/24 hours (0-14.9).  She was seen by Tammi Klippel, PA and Dr Vira Agar in the GI clinic on 05/15/2017.  She noted continued intermittent nausea, vomiting, and diarrhea, as well as early satiety and weight loss. Review of weights indicate she has lost 20 lbs since 04/03/17. MRA was ordered.   Patient is currently residing at [facility], where she is receiving both physical and occupation therapy. Patient will remain in the facility until she is safe to independently live at home.   Symptomatically, patient is doing "better" overall.  Her nausea, vomiting, and diarrhea has resolved. Patient is eating better and "gaining weight". She continues to take omeprazole BID. Patient denies fevers and sweats.    Past Medical History:  Diagnosis Date  . Allergic rhinitis   . Asthma   . Cancer (HCC)    BREAST  . Depression   . Edema   . GERD (gastroesophageal reflux disease)   . Hyperlipidemia   . Hypertension   . Osteoporosis, postmenopausal     Past Surgical History:  Procedure Laterality Date  . COLONOSCOPY WITH PROPOFOL N/A 02/16/2017   Procedure: COLONOSCOPY WITH PROPOFOL;  Surgeon: Manya Silvas, MD;  Location: [facility] ENDOSCOPY;  Service: Endoscopy;  Laterality: N/A;  . ESOPHAGOGASTRODUODENOSCOPY (EGD) WITH PROPOFOL N/A 02/16/2017   Procedure: ESOPHAGOGASTRODUODENOSCOPY (EGD) WITH PROPOFOL;  Surgeon: Manya Silvas, MD;  Location: [facility] ENDOSCOPY;  Service: Endoscopy;  Laterality: N/A;  . MASTECTOMY    . WRIST SURGERY Left     Family History  Problem Relation Age of Onset  . Heart attack Father   . Hypertension Other   . Cancer Maternal Aunt   She denies any family history of malignancy.   Social History:   reports that  has never smoked. she has never used smokeless tobacco. She reports that she drinks alcohol. She reports that she does not use drugs.  She is currently residing at [facility], where she is receiving both physical and occupation therapy.  She is accompanied by her sister Teddy Spike) and brother-in-law Milly Jakob) today.  Allergies:  Allergies  Allergen Reactions  . Iodinated Diagnostic Agents Shortness Of Breath  . Aspirin Tinitus and Other (See Comments)    Tinnitus  . Codeine   . Eggs Or Egg-Derived Products Other (See Comments)    On testing  . Hydrochlorothiazide Other (See Comments)  . Morphine And Related Nausea And Vomiting    Current Medications: Current Outpatient Medications  Medication Sig Dispense Refill  . azelastine (ASTELIN) 0.1 % nasal spray Place 1 spray into both nostrils 2 (two) times daily. Use in each nostril as directed    . carvedilol (COREG) 25 MG tablet Take 25 mg by mouth 2 (two) times daily with a meal.    . cetirizine (ZYRTEC) 10 MG tablet Take 10 mg by mouth daily.    . ergocalciferol (VITAMIN D2) 50000 units capsule Take 50,000 Units by mouth once a week.    . fluticasone furoate-vilanterol (BREO ELLIPTA) 100-25 MCG/INH AEPB Inhale 1 puff into the lungs daily.    . montelukast (SINGULAIR) 10 MG tablet Take 10 mg by mouth at bedtime.    Marland Kitchen omeprazole (PRILOSEC) 40 MG capsule Take 40 mg by mouth 2 (two) times daily.    . ondansetron (ZOFRAN ODT) 4 MG disintegrating tablet Take 1 tablet (4 mg total) by mouth every 8 (eight) hours as needed for nausea or vomiting. 20 tablet 0  . potassium chloride 20 MEQ TBCR Take 20 mEq by mouth 2 (two) times daily. 30 tablet 0  . ranitidine (ZANTAC) 150 MG capsule Take 150 mg by mouth 2 (two) times daily.    . sertraline (ZOLOFT) 50 MG tablet Take 50 mg by mouth daily.    . simvastatin (ZOCOR) 40 MG tablet Take 40 mg by mouth daily.    . verapamil (CALAN-SR) 240 MG CR tablet Take 1  tablet (240 mg total) by mouth daily. 30 tablet 0   No current facility-administered medications for this visit.     Review of Systems:  GENERAL:  Fatigue.  Feels better.  No fevers or sweats.  Weight loss of 74 pounds since 09/2016.  Recent weight gain with resolution of diarrhea. PERFORMANCE STATUS (ECOG):  2 HEENT:  No visual changes, runny nose, sore throat, mouth sores or tenderness. Lungs: No shortness of breath or cough.  No hemoptysis. Cardiac:  No chest pain, palpitations, orthopnea, or PND. GI:  Diarrhea, resolved.  Reflux.  Nausea and vomiting, resolved.  No constipation, melena or hematochezia. GU:  No urgency, frequency, dysuria, or hematuria. Musculoskeletal:  No back pain.  No joint pain.  No muscle tenderness. Extremities:  No pain or swelling. Skin:  No rashes or skin changes. Neuro:  General weakness, improving.  No headache, numbness or weakness, balance or coordination issues. Endocrine:  No diabetes, thyroid issues, hot flashes or night sweats. Psych:  No mood changes, depression  or anxiety. Pain:  No focal pain. Review of systems:  All other systems reviewed and found to be negative.  Physical Exam: Blood pressure 129/85, pulse 91, temperature 98.2 F (36.8 C), temperature source Tympanic, resp. rate 20, weight 157 lb 4 oz (71.3 kg). GENERAL:  Slightly pale appearing woman sitting comfortably in the medical oncology clinic in no acute distress. MENTAL STATUS:  Alert and oriented to person, place and time. HEAD:  Short blonde hair.  Normocephalic, atraumatic, face symmetric, no Cushingoid features. EYES:  Glasses.  Blue eyes.  Pupils equal round and reactive to light and accomodation.  No conjunctivitis or scleral icterus. ENT:  Oropharynx clear without lesion.  Tongue normal. Mucous membranes moist.  RESPIRATORY:  Clear to auscultation without rales, wheezes or rhonchi. CARDIOVASCULAR:  Regular rate and rhythm without murmur, rub or gallop. ABDOMEN:  Soft,  non-tender, with active bowel sounds, and no hepatosplenomegaly.  No masses. SKIN:  No rashes, ulcers or lesions. EXTREMITIES: No edema, no skin discoloration or tenderness.  No palpable cords. LYMPH NODES: No palpable cervical, supraclavicular, axillary or inguinal adenopathy  NEUROLOGICAL: Unremarkable. PSYCH:  Appropriate.   No visits with results within 3 Day(s) from this visit.  Latest known visit with results is:  Hospital Outpatient Visit on 05/24/2017  Component Date Value Ref Range Status  . 5-HIAA, Ur 05/20/2017 1.8  Undefined mg/L Final  . 5-HIAA,Quant.,24 Hr Urine 05/20/2017 1.4  0.0 - 14.9 mg/24 hr Final   Comment: (NOTE) This test was developed and its performance characteristics determined by [facility]. It has not been cleared or approved by the Food and Drug Administration. Performed At: [facility] [place], [place] [id number] Rush Farmer MD [id number]   . Total Volume 05/20/2017 800   Final    Assessment:  Jenna Carlson is a 66 y.o. female with diarrhea since 11/2016.  She has been on a PPI since 02/2017.  Gastrin level was 262 (0-115) on 05/16/2017.  Colonoscopy on 02/16/2017 revealed small TA in ascending colon, sigmoid/descending diverticulosis.  EGD on 02/16/2017 revealed moderate Schatzki ring (dilated), gastritis, four non-bleeding crated and superficial duodenal ulcers within duodenal bulb.  Abdomen MRI on 05/17/2017 revealed a nonspecific tiny 0.4 cm focus of arterial phase hyperenhancement at the pancreaticoduodenal groove just above the ampulla. Tiny gastrinoma was not excluded.   Octreotide scan on 05/22/2017 revealed no scintigraphic evidence of somatostatin receptor avid neoplasm.  Work-up revealed the following normal studies: vasoactive intetinal polypeptide (VIP) and 5-HIAA quantitative 24 hour urine.  Chromogranin A was 9 (0-5).    Symptomatically, patient is doing "better".  Her nausea, vomiting, and diarrhea has  improved. She lost 74 pounds since 09/2016, but is now gaining weight.  Exam is normal.  Plan: 1.  Discuss recent hospitalization and work-up to date. 2.  Discuss mildy elevated chromogranin.  Test is non-specific.  Differential is broad: medications (PPI and H2 blockers), GI disorders (chronic atrophic gastritis, Inflammatory bowel disease, pancreatitis, etc), endocrine disorders, hypertension, chronic bronchitis, and several malignancies.  No current evidence of malignancy. 3.  Discuss ongoing work-up with GI. 4.  RTC prn.   Honor Loh, NP  06/01/2017, 10:47 AM   I saw and evaluated the patient, participating in the key portions of the service and reviewing pertinent diagnostic studies and records.  I reviewed the nurse practitioner's note and agree with the findings and the plan.  The assessment and plan were discussed with the patient.  Several questions were asked by the patient and answered.  Nolon Stalls, MD 06/01/2017,10:47 AM

## 2017-06-03 ENCOUNTER — Encounter: Payer: Self-pay | Admitting: Hematology and Oncology

## 2017-06-04 LAB — 5 HIAA, QUANTITATIVE, URINE, 24 HOUR: Total Volume: 800

## 2017-07-09 ENCOUNTER — Emergency Department
Admission: EM | Admit: 2017-07-09 | Discharge: 2017-07-10 | Disposition: A | Discharge status: Home / Self Care | Payer: Medicare Other | Attending: Emergency Medicine | Admitting: Emergency Medicine

## 2017-07-09 ENCOUNTER — Encounter: Payer: Self-pay | Admitting: *Deleted

## 2017-07-09 ENCOUNTER — Other Ambulatory Visit: Payer: Self-pay

## 2017-07-09 DIAGNOSIS — R531 Weakness: Secondary | ICD-10-CM | POA: Diagnosis not present

## 2017-07-09 DIAGNOSIS — J45909 Unspecified asthma, uncomplicated: Secondary | ICD-10-CM | POA: Insufficient documentation

## 2017-07-09 DIAGNOSIS — I10 Essential (primary) hypertension: Secondary | ICD-10-CM | POA: Insufficient documentation

## 2017-07-09 DIAGNOSIS — Z79899 Other long term (current) drug therapy: Secondary | ICD-10-CM | POA: Diagnosis not present

## 2017-07-09 DIAGNOSIS — R197 Diarrhea, unspecified: Secondary | ICD-10-CM

## 2017-07-09 DIAGNOSIS — E860 Dehydration: Secondary | ICD-10-CM | POA: Insufficient documentation

## 2017-07-09 DIAGNOSIS — Z853 Personal history of malignant neoplasm of breast: Secondary | ICD-10-CM | POA: Diagnosis not present

## 2017-07-09 DIAGNOSIS — Z9010 Acquired absence of unspecified breast and nipple: Secondary | ICD-10-CM | POA: Diagnosis not present

## 2017-07-09 LAB — CBC
HEMATOCRIT: 41.8 % (ref 35.0–47.0)
HEMOGLOBIN: 13.9 g/dL (ref 12.0–16.0)
MCH: 33.5 pg (ref 26.0–34.0)
MCHC: 33.2 g/dL (ref 32.0–36.0)
MCV: 101 fL — ABNORMAL HIGH (ref 80.0–100.0)
Platelets: 451 10*3/uL — ABNORMAL HIGH (ref 150–440)
RBC: 4.14 MIL/uL (ref 3.80–5.20)
RDW: 13.1 % (ref 11.5–14.5)
WBC: 9.2 10*3/uL (ref 3.6–11.0)

## 2017-07-09 LAB — BASIC METABOLIC PANEL
ANION GAP: 16 — AB (ref 5–15)
BUN: 12 mg/dL (ref 6–20)
CALCIUM: 9.5 mg/dL (ref 8.9–10.3)
CO2: 18 mmol/L — ABNORMAL LOW (ref 22–32)
Chloride: 103 mmol/L (ref 101–111)
Creatinine, Ser: 0.83 mg/dL (ref 0.44–1.00)
GFR calc non Af Amer: >60 mL/min (ref >60)
GLUCOSE: 98 mg/dL (ref 65–99)
POTASSIUM: 4.7 mmol/L (ref 3.5–5.1)
Sodium: 137 mmol/L (ref 135–145)

## 2017-07-09 LAB — URINALYSIS, COMPLETE (UACMP) WITH MICROSCOPIC
BILIRUBIN URINE: NEGATIVE
Bacteria, UA: NONE SEEN
GLUCOSE, UA: NEGATIVE mg/dL
Hgb urine dipstick: NEGATIVE
KETONES UR: 20 mg/dL — AB
Leukocytes, UA: NEGATIVE
NITRITE: NEGATIVE
PH: 5 (ref 5.0–8.0)
Protein, ur: NEGATIVE mg/dL
Specific Gravity, Urine: 1.017 (ref 1.005–1.030)

## 2017-07-09 LAB — T4, FREE: Free T4: 0.78 ng/dL (ref 0.61–1.12)

## 2017-07-09 LAB — TROPONIN I: Troponin I: <0.03 ng/mL (ref <0.03)

## 2017-07-09 LAB — TSH: TSH: 1.85 u[IU]/mL (ref 0.350–4.500)

## 2017-07-09 MED ORDER — SODIUM CHLORIDE 0.9 % IV BOLUS (SEPSIS)
1000.0000 mL | Freq: Once | INTRAVENOUS | Status: AC
  Administered 2017-07-10: 1000 mL via INTRAVENOUS

## 2017-07-09 MED ORDER — SODIUM CHLORIDE 0.9 % IV BOLUS (SEPSIS)
1000.0000 mL | Freq: Once | INTRAVENOUS | Status: AC
  Administered 2017-07-09: 1000 mL via INTRAVENOUS

## 2017-07-09 MED ORDER — ONDANSETRON HCL 4 MG/2ML IJ SOLN
4.0000 mg | Freq: Once | INTRAMUSCULAR | Status: AC
  Administered 2017-07-09: 4 mg via INTRAVENOUS
  Filled 2017-07-09: qty 2

## 2017-07-09 NOTE — ED Provider Notes (Signed)
[facility] Emergency Department Provider Note  ____________________________________________  Time seen: Approximately 9:08 PM  I have reviewed the triage vital signs and the nursing notes.   HISTORY  Chief Complaint Weakness   HPI Jenna Carlson is a 67 y.o. female who presents for evaluation of generalized weakness.Patient reports 10 days of the nausea, decreased by mouth intake, and progressively worsening generalized fatigue. According to her family she has been sleeping 20 hours a day which has been ongoing since June 2018. Patient reports that she started having diarrhea again. She is barely eating or drinking at home. She was admitted back in November for similar symptoms with several electrolyte abnormalities and was diagnosed with several ulcers. She reports that she completely recovered from that admission until 10 days ago. She denies fever or chills, vomiting, URI symptoms, abdominal pain, chest pain, shortness of breath, melena, coffee ground emesis. Today she was trying to get out of the house and could barely walk because she felt so weak. She called her GI doctor and his nurse told her to come to the ED since the GI doctor could not see her today. No history of C. difficile or recent antibiotic use.  Chief Complaint: generalized weakness Severity: severe Duration: 10 days Context: in the setting of decreased PO intake and diarrhea Associated signs/symptoms: nausea, sleeping too much    Past Medical History:  Diagnosis Date  . Allergic rhinitis   . Asthma   . Cancer (HCC)    BREAST  . Depression   . Edema   . GERD (gastroesophageal reflux disease)   . Hyperlipidemia   . Hypertension   . Osteoporosis, postmenopausal     Patient Active Problem List   Diagnosis Date Noted  . Elevated gastrin level 06/01/2017  . Diarrhea   . Weakness 05/16/2017  . Hypokalemia 05/16/2017  . Multiple duodenal ulcers 05/16/2017  . HTN (hypertension)  05/16/2017  . HLD (hyperlipidemia) 05/16/2017    Past Surgical History:  Procedure Laterality Date  . COLONOSCOPY WITH PROPOFOL N/A 02/16/2017   Procedure: COLONOSCOPY WITH PROPOFOL;  Surgeon: Manya Silvas, MD;  Location: [facility] ENDOSCOPY;  Service: Endoscopy;  Laterality: N/A;  . ESOPHAGOGASTRODUODENOSCOPY (EGD) WITH PROPOFOL N/A 02/16/2017   Procedure: ESOPHAGOGASTRODUODENOSCOPY (EGD) WITH PROPOFOL;  Surgeon: Manya Silvas, MD;  Location: [facility] ENDOSCOPY;  Service: Endoscopy;  Laterality: N/A;  . MASTECTOMY    . WRIST SURGERY Left     Prior to Admission medications   Medication Sig Start Date End Date Taking? Authorizing Provider  azelastine (ASTELIN) 0.1 % nasal spray Place 1 spray into both nostrils 2 (two) times daily. Use in each nostril as directed    [provider]  carvedilol (COREG) 25 MG tablet Take 25 mg by mouth 2 (two) times daily with a meal.    [provider]  cetirizine (ZYRTEC) 10 MG tablet Take 10 mg by mouth daily.    [provider]  ergocalciferol (VITAMIN D2) 50000 units capsule Take 50,000 Units by mouth once a week.    [provider]  fluticasone furoate-vilanterol (BREO ELLIPTA) 100-25 MCG/INH AEPB Inhale 1 puff into the lungs daily.    [provider]  montelukast (SINGULAIR) 10 MG tablet Take 10 mg by mouth at bedtime.    [provider]  omeprazole (PRILOSEC) 40 MG capsule Take 40 mg by mouth 2 (two) times daily. 03/09/17 03/09/18  [provider]  ondansetron (ZOFRAN ODT) 4 MG disintegrating tablet Take 1 tablet (4 mg total)  by mouth every 8 (eight) hours as needed for nausea or vomiting. 01/23/17   Harvest Dark, MD  potassium chloride 20 MEQ TBCR Take 20 mEq by mouth 2 (two) times daily. 03/18/17   Arta Silence, MD  ranitidine (ZANTAC) 150 MG capsule Take 150 mg by mouth 2 (two) times daily.    [provider]  sertraline (ZOLOFT) 50 MG tablet Take 50 mg by mouth daily.     [provider]  simvastatin (ZOCOR) 40 MG tablet Take 40 mg by mouth daily.    [provider]  verapamil (CALAN-SR) 240 MG CR tablet Take 1 tablet (240 mg total) by mouth daily. 05/21/17   Bettey Costa, MD    Allergies Iodinated diagnostic agents; Aspirin; Codeine; Eggs or egg-derived products; Hydrochlorothiazide; and Morphine and related  Family History  Problem Relation Age of Onset  . Heart attack Father   . Hypertension Other   . Cancer Maternal Aunt     Social History Social History   Tobacco Use  . Smoking status: Never Smoker  . Smokeless tobacco: Never Used  Substance Use Topics  . Alcohol use: Yes    Comment: occasional wine  . Drug use: No    Review of Systems  Constitutional: Negative for fever. + generalized weakness Eyes: Negative for visual changes. ENT: Negative for sore throat. Neck: No neck pain  Cardiovascular: Negative for chest pain. Respiratory: Negative for shortness of breath. Gastrointestinal: Negative for abdominal pain, vomiting. + nausea and diarrhea Genitourinary: Negative for dysuria. Musculoskeletal: Negative for back pain. Skin: Negative for rash. Neurological: Negative for headaches, weakness or numbness. Psych: No SI or HI  ____________________________________________   PHYSICAL EXAM:  VITAL SIGNS: ED Triage Vitals  Enc Vitals Group     BP 07/09/17 1634 (!) 137/94     Pulse Rate 07/09/17 1634 94     Resp 07/09/17 1634 20     Temp 07/09/17 1634 97.8 F (36.6 C)     Temp Source 07/09/17 1634 Oral     SpO2 07/09/17 1634 97 %     Weight 07/09/17 1635 156 lb (70.8 kg)     Height 07/09/17 1635 5\' 4"  (1.626 m)     Head Circumference --      Peak Flow --      Pain Score 07/09/17 1635 6     Pain Loc --      Pain Edu? --      Excl. in [place]? --     Constitutional: Alert and oriented. Well appearing and in no apparent distress. HEENT:      Head: Normocephalic and atraumatic.         Eyes: Conjunctivae are  normal. Sclera is non-icteric.       Mouth/Throat: Mucous membranes are dry.       Neck: Supple with no signs of meningismus. Cardiovascular: Regular rate and rhythm. No murmurs, gallops, or rubs. 2+ symmetrical distal pulses are present in all extremities. No JVD. Respiratory: Normal respiratory effort. Lungs are clear to auscultation bilaterally. No wheezes, crackles, or rhonchi.  Gastrointestinal: Soft, non tender, and non distended with positive bowel sounds. No rebound or guarding. Genitourinary: No CVA tenderness. Musculoskeletal: Nontender with normal range of motion in all extremities. No edema, cyanosis, or erythema of extremities. Neurologic: Normal speech and language. Face is symmetric. Moving all extremities. No gross focal neurologic deficits are appreciated. Skin: Skin is warm, dry and intact. No rash noted. Psychiatric: Mood and affect are normal. Speech and behavior are normal.  ____________________________________________   LABS (all labs ordered are listed, but only abnormal results are displayed)  Labs Reviewed  BASIC METABOLIC PANEL - Abnormal; Notable for the following components:      Result Value   CO2 18 (*)    Anion gap 16 (*)    All other components within normal limits  CBC - Abnormal; Notable for the following components:   MCV 101.0 (*)    Platelets 451 (*)    All other components within normal limits  URINALYSIS, COMPLETE (UACMP) WITH MICROSCOPIC - Abnormal; Notable for the following components:   Color, Urine YELLOW (*)    APPearance CLEAR (*)    Ketones, ur 20 (*)    Squamous Epithelial / LPF 0-5 (*)    All other components within normal limits  C DIFFICILE QUICK SCREEN W PCR REFLEX  TROPONIN I  T4, FREE  TSH   ____________________________________________  EKG  ED ECG REPORT I, Rudene Re, the attending physician, personally viewed and interpreted this ECG.  Normal sinus rhythm, rate of 97, normal intervals, normal axis, no ST  elevations or depressions. Unchanged from prior from December 2018 ____________________________________________  RADIOLOGY  none  ____________________________________________   PROCEDURES  Procedure(s) performed: None Procedures Critical Care performed:  None ____________________________________________   INITIAL IMPRESSION / ASSESSMENT AND PLAN / ED COURSE   67 y.o. female who presents for evaluation of generalized weakness in the setting of decreased appetite and diarrhea. Patient looks dry on exam with dry mucous membranes, has normal vital signs, lungs are clear, abdomen is soft, EKG shows no evidence of ischemia, labs showing dehydration with an anion gap of 16 and CO2 18. Normal BG, troponin negative, normal creatinine, no evidence of anemia or leukocytosis. We'll give IV fluids, check urinalysis, check C. Difficile, check thyroid studies.     _________________________ 11:56 PM on 07/09/2017 ----------------------------------------- Patient able to ambulate but family concerned because she lives alone and her house has steps and today she was unable to walk the steps. Patient reports that she feels slightly better but still very weak and she is afraid she will not be able to walk inside her house. Patient is requesting to stay in the ED for PT eval and SW consult in the morning for possible SNF placement. Will give a second bolus and place order for PT and SW. Care transferred to Dr. Dahlia Client    As part of my medical decision making, I reviewed the following data within the [place] notes reviewed and incorporated, Labs reviewed , EKG interpreted , Old chart reviewed, Patient signed out to Dr. Dahlia Client, Notes from prior ED visits and  Controlled Substance Database    Pertinent labs & imaging results that were available during my care of the patient were reviewed by me and considered in my medical decision making (see chart for  details).    ____________________________________________   FINAL CLINICAL IMPRESSION(S) / ED DIAGNOSES  Final diagnoses:  Generalized weakness  Dehydration  Diarrhea of presumed infectious origin      NEW MEDICATIONS STARTED DURING THIS VISIT:  ED Discharge Orders    None       Note:  This document was prepared using Dragon voice recognition software and may include unintentional dictation errors.    Alfred Levins, [place], MD 07/09/17 [phone]

## 2017-07-09 NOTE — ED Notes (Addendum)
Pt reports weakness since June. Nausea x 1.5 weeks - has not had much to eat d/t increased nausea. Discharged from here December for "same thing." Diagnosed with "cratered" ulcers in small intestine in August; family states repeat scan showed they may be healed up. Pt also reports 1 fall in last month. But also fell in September "because [she] was dizzy from taking too much blood pressure medication."  Pt reports having no appetite and being weak. Pt was weak upon transfer from wheel chair to bed.

## 2017-07-09 NOTE — ED Notes (Signed)
Pt unable to void at this time. 

## 2017-07-09 NOTE — ED Triage Notes (Signed)
Pt to traige via wheelchair. Pt reports feeling weak today and for the past 7 months.  Pt reports nausea and recent falls.  Pt alert.  Speech clear.

## 2017-07-10 MED ORDER — ACETAMINOPHEN 325 MG PO TABS
650.0000 mg | ORAL_TABLET | Freq: Once | ORAL | Status: AC
  Administered 2017-07-10: 650 mg via ORAL

## 2017-07-10 MED ORDER — ACETAMINOPHEN 325 MG PO TABS
ORAL_TABLET | ORAL | Status: AC
  Filled 2017-07-10: qty 2

## 2017-07-10 MED ORDER — ACETAMINOPHEN 325 MG PO TABS
ORAL_TABLET | ORAL | Status: AC
  Administered 2017-07-10: 650 mg via ORAL
  Filled 2017-07-10: qty 2

## 2017-07-10 MED ORDER — ONDANSETRON 4 MG PO TBDP: 4.0000 mg | ORAL_TABLET | Freq: Three times a day (TID) | ORAL | 0 refills | Status: DC | PRN

## 2017-07-10 MED ORDER — ONDANSETRON HCL 4 MG/2ML IJ SOLN
4.0000 mg | Freq: Once | INTRAMUSCULAR | Status: AC
  Administered 2017-07-10: 4 mg via INTRAVENOUS
  Filled 2017-07-10: qty 2

## 2017-07-10 NOTE — Care Management Note (Signed)
Case Management Note  Patient Details  Name: Jenna Carlson MRN: [id number] Date of Birth: Jan 03, 1951  Subjective/Objective:      Spoke to pt. In ER after getting referral from [place] by text. It turns out that the patient is currently served for [facility] PT with [facility], and would like to continue with them. I spoke to the MD who is agreeable. I contacted Joelene Millin with [facility] and verified they do indeed have the patient on their schedule and will f/u once the patient is discharged later today.              Action/Plan:   Expected Discharge Date:                  Expected Discharge Plan:     In-House Referral:     Discharge planning Services     Post Acute Care Choice:    Choice offered to:     DME Arranged:    DME Agency:     HH Arranged:    HH Agency:     Status of Service:     If discussed at [employer] of Stay Meetings, dates discussed:    Additional Comments:  Beau Fanny, RN 07/10/2017, 11:40 AM

## 2017-07-10 NOTE — Accreditation Note (Signed)
PT contacted CSW and informed that there is no skillable need to go to STR at this time and that patient would be able to return home with home health services. CSW has relayed this information to the RN CM. Shela Leff MSW,LCSW [phone]

## 2017-07-10 NOTE — Clinical Social Work Note (Signed)
CSW received consult. Patient was allowed to remain in the ED overnight at her request to be evaluated by PT this morning. According to documentation this has been a chronic issue for at least 7 months. If PT recommends STR, prior auth will need to be received as [facility] is now requiring this. Patient may need to discharge home with home health services. CSW contacted [place] with PT and she will be evaluating patient. Shela Leff MSW,LCSW [phone]

## 2017-07-10 NOTE — Evaluation (Signed)
Physical Therapy Evaluation Patient Details Name: Jenna Carlson MRN: [id number] DOB: [birth year] Today's Date: 07/10/2017   History of Present Illness  Pt admitted for complaints of weakness with acute falls secondary to dizziness. History includes asthma, GI ulcers, breast cancer, GERD, and HTN.  Clinical Impression  Pt is a pleasant 67 year old female who was admitted for generalized weakness. Per chart, this is chronic and she has been feeling weak since last summer. Pt reports she's unsure how much home PT is able to help and is requesting more frequent sessions. Pt performs bed mobility with mod I, transfers with supervision, and ambulation with cga and RW. Pt is safe with RW and isn't impulsive. Pt demonstrates deficits with weakness/endurance. Based on current deficits, do not recommend SNF at this time and would recommend continued Home PT with increased frequency. Would benefit from skilled PT to address above deficits and promote optimal return to PLOF.        Follow Up Recommendations Home health PT(with increased frequency of PT if able)    Equipment Recommendations  None recommended by PT    Recommendations for Other Services       Precautions / Restrictions Precautions Precautions: Fall Restrictions Weight Bearing Restrictions: No      Mobility  Bed Mobility Overal bed mobility: Modified Independent             General bed mobility comments: safe technique with HHA. Once seated at EOB, pt reports she feels more nausea than when in supine position.  Transfers Overall transfer level: Needs assistance Equipment used: Rolling walker (2 wheeled) Transfers: Sit to/from Stand Sit to Stand: Supervision         General transfer comment: safe technique, slow transfer. No buckling noted  Ambulation/Gait Ambulation/Gait assistance: Min guard Ambulation Distance (Feet): 40 Feet Assistive device: Rolling walker (2 wheeled) Gait Pattern/deviations: Step-through  pattern     General Gait Details: ambulated with cautious and slow gait speed in room. No dizziness noted, just generalized fatigue.  Stairs            Wheelchair Mobility    Modified Rankin (Stroke Patients Only)       Balance Overall balance assessment: History of Falls;Needs assistance Sitting-balance support: Feet supported Sitting balance-Leahy Scale: Good     Standing balance support: Bilateral upper extremity supported Standing balance-Leahy Scale: Good                               Pertinent Vitals/Pain Pain Assessment: No/denies pain    Home Living Family/patient expects to be discharged to:: Private residence Living Arrangements: Alone Available Help at Discharge: Family;Available PRN/intermittently Type of Home: House Home Access: Stairs to enter Entrance Stairs-Rails: Can reach both Entrance Stairs-Number of Steps: 4-5 Home Layout: One level Home Equipment: Walker - 2 wheels      Prior Function Level of Independence: Independent with assistive device(s)         Comments: was using RW for all mobility, limited household distances     Hand Dominance        Extremity/Trunk Assessment   Upper Extremity Assessment Upper Extremity Assessment: Generalized weakness(B UE grossly 4/5)    Lower Extremity Assessment Lower Extremity Assessment: Generalized weakness(B LE grossly 4/5)       Communication   Communication: No difficulties  Cognition Arousal/Alertness: Awake/alert Behavior During Therapy: WFL for tasks assessed/performed Overall Cognitive Status: Within Functional Limits for tasks assessed  General Comments      Exercises Other Exercises Other Exercises: ambulated to [facility] in room with safe technique. Supervision given for hygiene.  Able to maintain balance during dressing.   Assessment/Plan    PT Assessment Patient needs continued PT services  PT Problem  List Decreased strength;Decreased activity tolerance;Decreased balance;Decreased mobility       PT Treatment Interventions Gait training;DME instruction;Therapeutic exercise    PT Goals (Current goals can be found in the Care Plan section)  Acute Rehab PT Goals Patient Stated Goal: to get stronger PT Goal Formulation: With patient Time For Goal Achievement: 07/24/17 Potential to Achieve Goals: Good    Frequency Min 2X/week   Barriers to discharge        Co-evaluation               AM-PAC PT "6 Clicks" Daily Activity  Outcome Measure Difficulty turning over in bed (including adjusting bedclothes, sheets and blankets)?: None Difficulty moving from lying on back to sitting on the side of the bed? : A Little Difficulty sitting down on and standing up from a chair with arms (e.g., wheelchair, bedside commode, etc,.)?: None Help needed moving to and from a bed to chair (including a wheelchair)?: None Help needed walking in hospital room?: None Help needed climbing 3-5 steps with a railing? : A Little 6 Click Score: 22    End of Session Equipment Utilized During Treatment: Gait belt Activity Tolerance: Patient tolerated treatment well Patient left: in bed Nurse Communication: Mobility status PT Visit Diagnosis: Muscle weakness (generalized) (M62.81);History of falling (Z91.81);Difficulty in walking, not elsewhere classified (R26.2)    Time: 5009-3818 PT Time Calculation (min) (ACUTE ONLY): 18 min   Charges:   PT Evaluation $PT Eval Low Complexity: 1 Low PT Treatments $Therapeutic Activity: 8-22 mins   PT G CodesGreggory Carlson, PT, DPT [phone]   Jenna Carlson 07/10/2017, 11:42 AM

## 2017-07-10 NOTE — ED Provider Notes (Signed)
Medically stable. PT eval finds pt suitable for home PT.  has home PT in place as confirmed by case management. Suitable for discharge. I'll provide a Zofran prescription for intermittent nausea.  Final diagnoses:  Generalized weakness  Dehydration  Diarrhea of presumed infectious origin      Carrie Mew, MD 07/10/17 [phone]

## 2018-03-01 IMAGING — CR DG RIBS W/ CHEST 3+V*R*
1 series · 3 of 3 positions shown · non-contrast
Comparison: None.

CLINICAL DATA: Right rib pain and bruising after falling and
hitting her right side on a night stand.

EXAM:
RIGHT RIBS AND CHEST - 3+ VIEW

[Series 1: dg ribs unilateral w/chest right · 0.14mm/px · 3 of 3 slices shown]
[im 1/3]
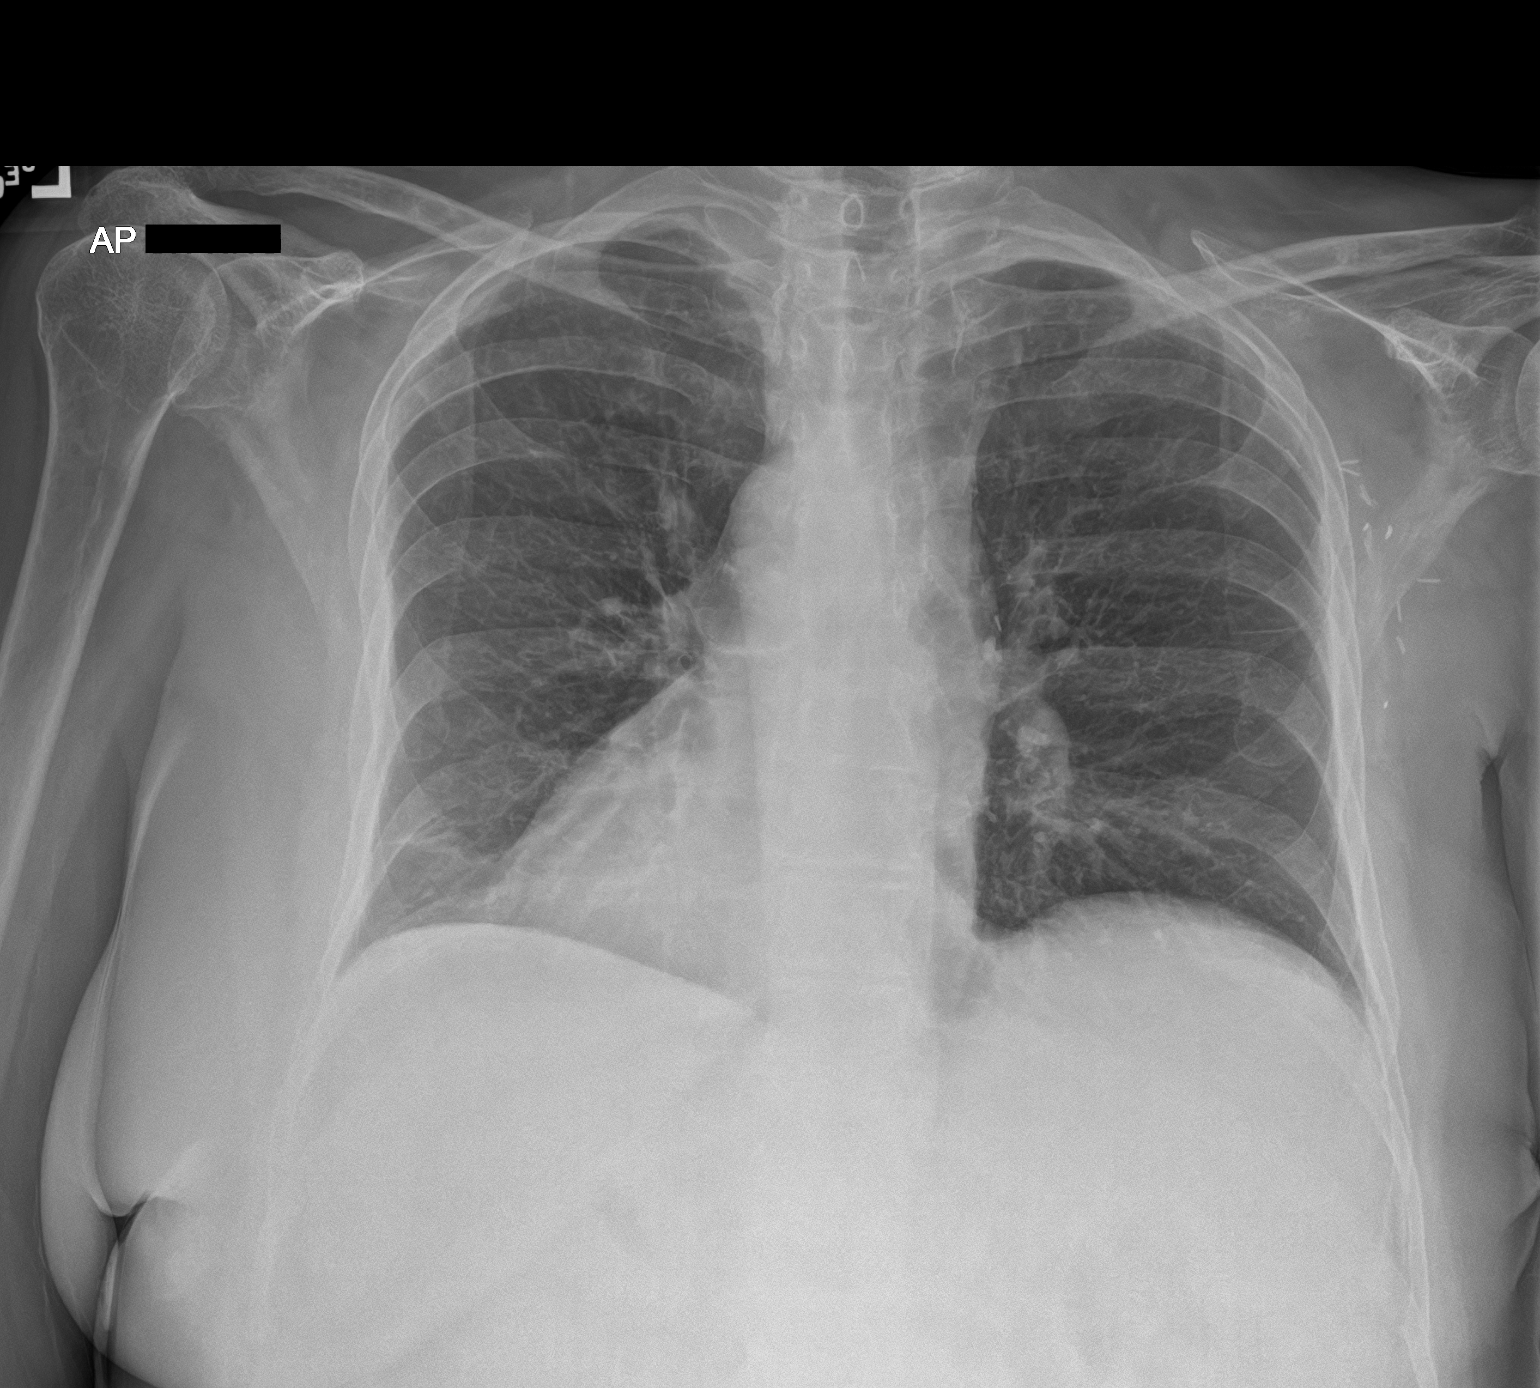
[im 2/3]
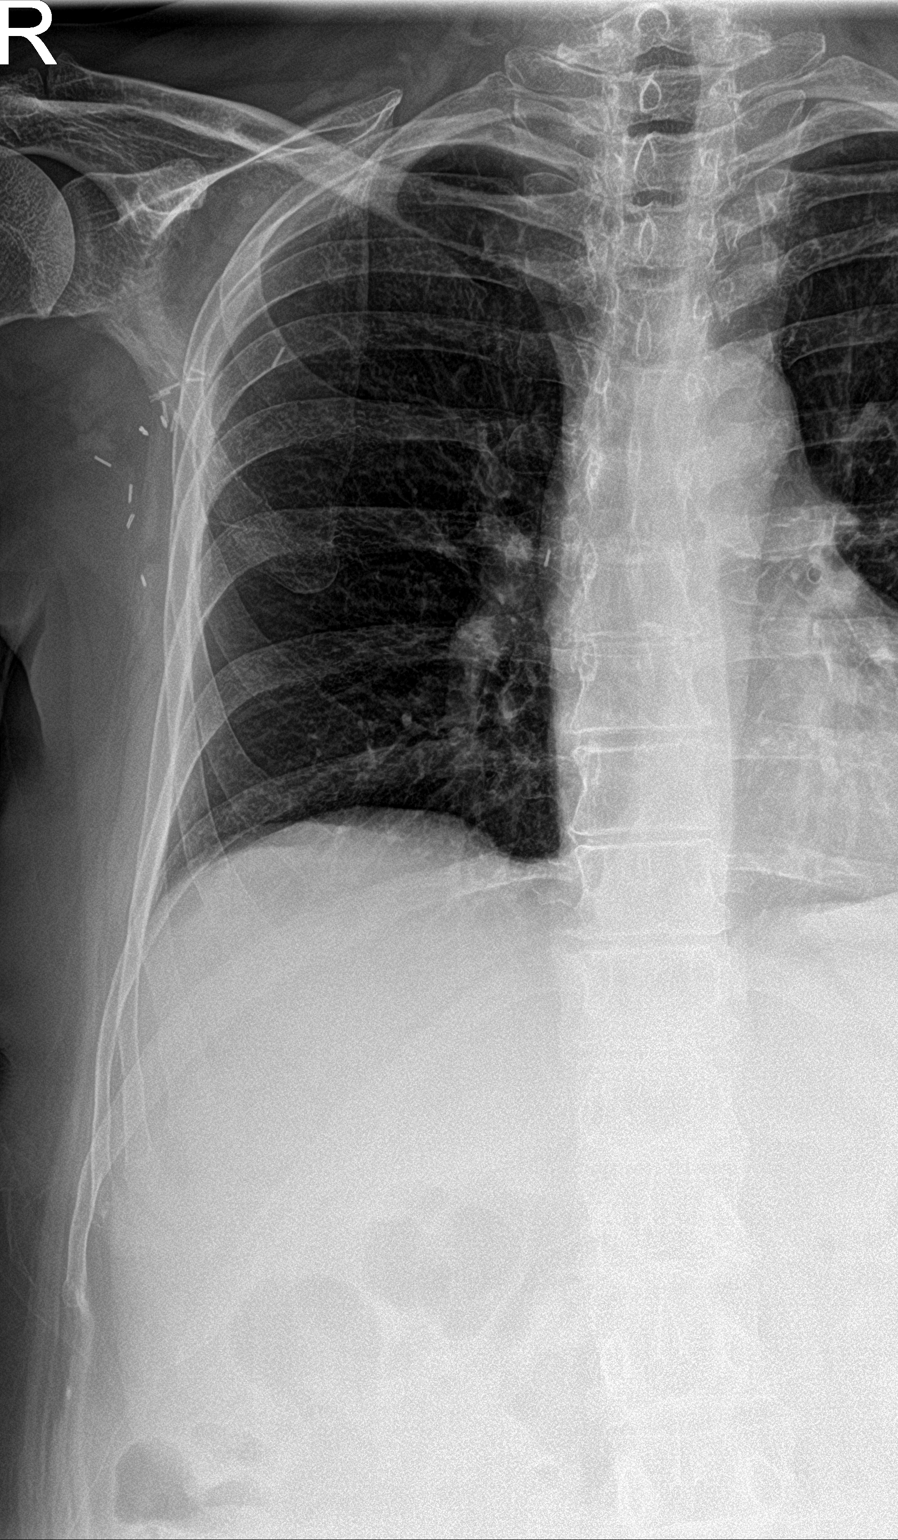
[im 3/3]
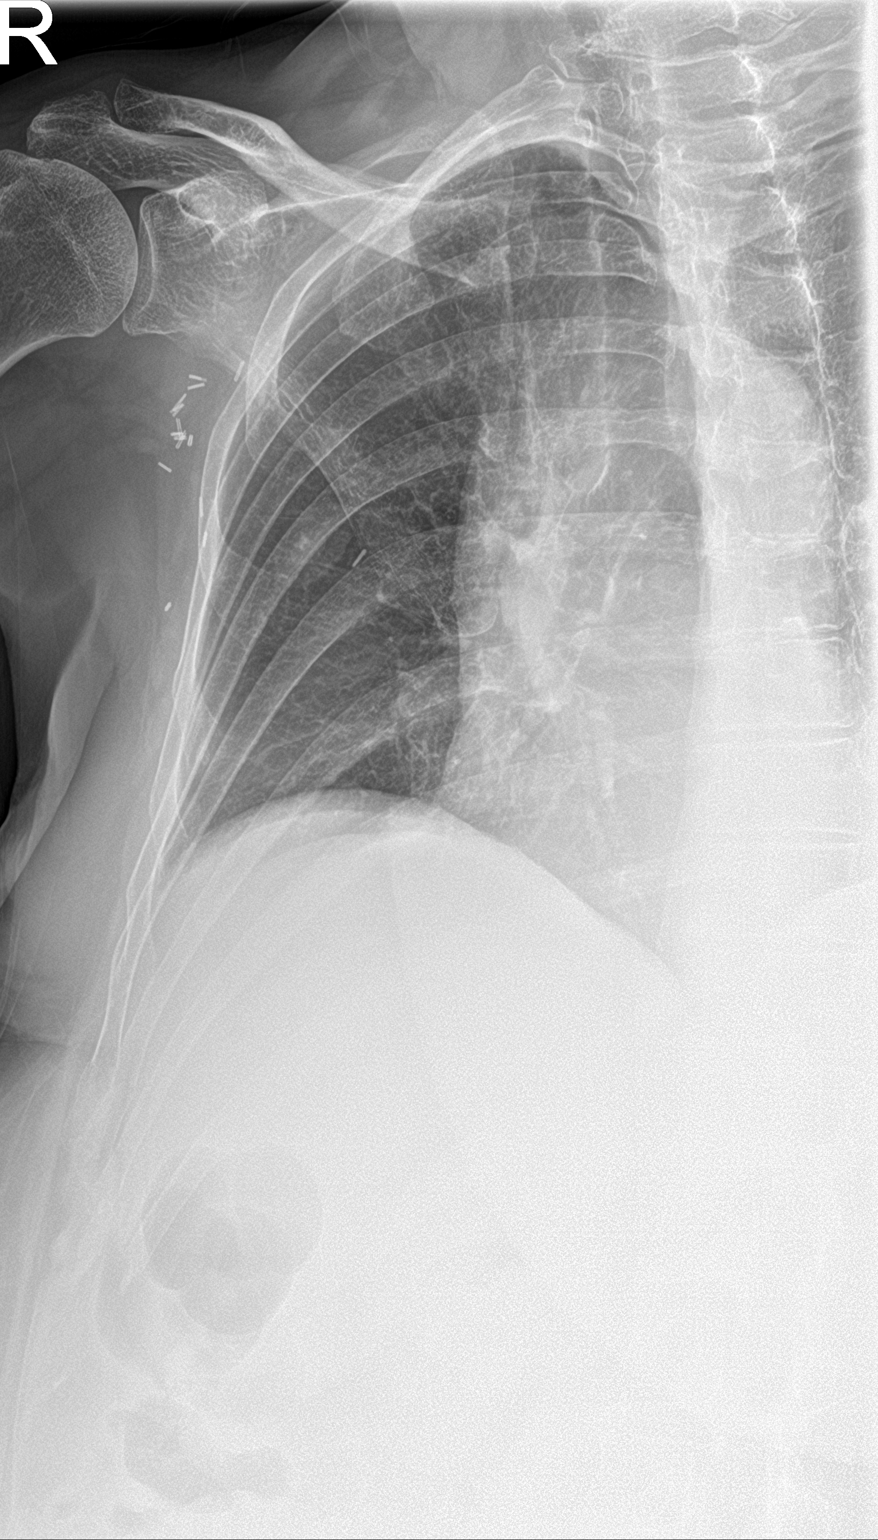

[3 of 3 positions shown; findings below may reference images not displayed]

FINDINGS: Normal sized heart. Small amount of linear density at the left lung
base. Otherwise, clear lungs. Right postmastectomy changes and
axillary surgical clips. Possible nondisplaced right anterolateral
seventh, eighth and ninth rib fractures. No pneumothorax.
IMPRESSION: 1. Possible nondisplaced right seventh, eighth and ninth rib
fracture.
2. Small amount of linear atelectasis or scarring at the left lung
base.

## 2018-05-13 ENCOUNTER — Emergency Department
Admission: EM | Admit: 2018-05-13 | Discharge: 2018-05-13 | Disposition: A | Discharge status: Home / Self Care | Payer: Medicare Other | Attending: Emergency Medicine | Admitting: Emergency Medicine

## 2018-05-13 DIAGNOSIS — J45909 Unspecified asthma, uncomplicated: Secondary | ICD-10-CM | POA: Insufficient documentation

## 2018-05-13 DIAGNOSIS — I10 Essential (primary) hypertension: Secondary | ICD-10-CM

## 2018-05-13 DIAGNOSIS — Z79899 Other long term (current) drug therapy: Secondary | ICD-10-CM | POA: Diagnosis not present

## 2018-05-13 LAB — COMPREHENSIVE METABOLIC PANEL
ALT: 30 U/L (ref 0–44)
AST: 27 U/L (ref 15–41)
Albumin: 3.8 g/dL (ref 3.5–5.0)
Alkaline Phosphatase: 72 U/L (ref 38–126)
Anion gap: 7 (ref 5–15)
BUN: 13 mg/dL (ref 8–23)
CALCIUM: 8.8 mg/dL — AB (ref 8.9–10.3)
CO2: 28 mmol/L (ref 22–32)
CREATININE: 0.78 mg/dL (ref 0.44–1.00)
Chloride: 104 mmol/L (ref 98–111)
Glucose, Bld: 100 mg/dL — ABNORMAL HIGH (ref 70–99)
Potassium: 3.8 mmol/L (ref 3.5–5.1)
Sodium: 139 mmol/L (ref 135–145)
TOTAL PROTEIN: 6.9 g/dL (ref 6.5–8.1)
Total Bilirubin: 0.7 mg/dL (ref 0.3–1.2)

## 2018-05-13 LAB — TROPONIN I: Troponin I: <0.03 ng/mL (ref <0.03)

## 2018-05-13 NOTE — ED Notes (Signed)
Labs sent at this time. Pt was given urine sample container as well.

## 2018-05-13 NOTE — Discharge Instructions (Addendum)
Please seek medical attention for any high fevers, chest pain, shortness of breath, change in behavior, persistent vomiting, bloody stool or any other new or concerning symptoms.  

## 2018-05-13 NOTE — ED Triage Notes (Signed)
Pt presents from [facility] for HTN. Pt is AAOx3 Skin warm dry intact and NAD at this time.

## 2018-05-13 NOTE — ED Provider Notes (Signed)
[facility] Emergency Department Provider Note  ____________________________________________   I have reviewed the triage vital signs and the nursing notes.   HISTORY  Chief Complaint Hypertension   History limited by: Not Limited   HPI Jenna Carlson is a 67 y.o. female who presents to the emergency department today because of concerns for high blood pressure.  She states that it is been high for roughly 1 week.  She first noticed that it was higher than normal when she was at her dentist last week and was told it was elevated.  Over the weekend the patient did not feel great.  She thinks it might of been due to some of the fluoride she swallowed at the dentist office.  She states that her blood pressure continued to be high.  She denies any headache or chest pain.  States that she has lost some weight over the past year and her doctor has taken her down and her dosage of antihypertensives.   Per medical record review patient has a history of hypertension  Past Medical History:  Diagnosis Date  . Allergic rhinitis   . Asthma   . Cancer (HCC)    BREAST  . Depression   . Edema   . GERD (gastroesophageal reflux disease)   . Hyperlipidemia   . Hypertension   . Osteoporosis, postmenopausal     Patient Active Problem List   Diagnosis Date Noted  . Elevated gastrin level 06/01/2017  . Diarrhea   . Weakness 05/16/2017  . Hypokalemia 05/16/2017  . Multiple duodenal ulcers 05/16/2017  . HTN (hypertension) 05/16/2017  . HLD (hyperlipidemia) 05/16/2017    Past Surgical History:  Procedure Laterality Date  . COLONOSCOPY WITH PROPOFOL N/A 02/16/2017   Procedure: COLONOSCOPY WITH PROPOFOL;  Surgeon: Manya Silvas, MD;  Location: [facility] ENDOSCOPY;  Service: Endoscopy;  Laterality: N/A;  . ESOPHAGOGASTRODUODENOSCOPY (EGD) WITH PROPOFOL N/A 02/16/2017   Procedure: ESOPHAGOGASTRODUODENOSCOPY (EGD) WITH PROPOFOL;  Surgeon: Manya Silvas, MD;  Location:  [facility] ENDOSCOPY;  Service: Endoscopy;  Laterality: N/A;  . MASTECTOMY    . WRIST SURGERY Left     Prior to Admission medications   Medication Sig Start Date End Date Taking? Authorizing Provider  carvedilol (COREG) 25 MG tablet Take 25 mg by mouth 2 (two) times daily with a meal.    [provider]  fluticasone furoate-vilanterol (BREO ELLIPTA) 100-25 MCG/INH AEPB Inhale 1 puff into the lungs daily.    [provider]  montelukast (SINGULAIR) 10 MG tablet Take 10 mg by mouth at bedtime.    [provider]  omeprazole (PRILOSEC) 40 MG capsule Take 40 mg by mouth 2 (two) times daily. 03/09/17 03/09/18  [provider]  ondansetron (ZOFRAN ODT) 4 MG disintegrating tablet Take 1 tablet (4 mg total) by mouth every 8 (eight) hours as needed for nausea or vomiting. 01/23/17   Harvest Dark, MD  ondansetron (ZOFRAN ODT) 4 MG disintegrating tablet Take 1 tablet (4 mg total) by mouth every 8 (eight) hours as needed for nausea or vomiting. 07/10/17   Carrie Mew, MD  potassium chloride 20 MEQ TBCR Take 20 mEq by mouth 2 (two) times daily. 03/18/17   Arta Silence, MD  sertraline (ZOLOFT) 50 MG tablet Take 50 mg by mouth daily.    [provider]  simvastatin (ZOCOR) 20 MG tablet Take by mouth daily.     [provider]  verapamil (CALAN-SR) 240 MG CR tablet Take 1 tablet (240 mg total) by mouth  daily. 05/21/17   Bettey Costa, MD    Allergies Iodinated diagnostic agents; Aspirin; Codeine; Eggs or egg-derived products; Hydrochlorothiazide; and Morphine and related  Family History  Problem Relation Age of Onset  . Heart attack Father   . Hypertension Other   . Cancer Maternal Aunt     Social History Social History   Tobacco Use  . Smoking status: Never Smoker  . Smokeless tobacco: Never Used  Substance Use Topics  . Alcohol use: Yes    Comment: occasional wine  . Drug use: No    Review of Systems Constitutional: No  fever/chills Eyes: No visual changes. ENT: No sore throat. Cardiovascular: Denies chest pain. Respiratory: Denies shortness of breath. Gastrointestinal: No abdominal pain.  No nausea, no vomiting.  No diarrhea.   Genitourinary: Negative for dysuria. Musculoskeletal: Negative for back pain. Skin: Negative for rash. Neurological: Negative for headaches, focal weakness or numbness.  ____________________________________________   PHYSICAL EXAM:  VITAL SIGNS: ED Triage Vitals  Enc Vitals Group     BP 05/13/18 1248 (!) 201/107     Pulse Rate 05/13/18 1248 63     Resp 05/13/18 1426 18     Temp 05/13/18 1248 98.3 F (36.8 C)     Temp Source 05/13/18 1248 Oral     SpO2 05/13/18 1248 100 %     Weight 05/13/18 1249 163 lb (73.9 kg)     Height --      Head Circumference --      Peak Flow --      Pain Score 05/13/18 1249 0   Constitutional: Alert and oriented.  Eyes: Conjunctivae are normal.  ENT      Head: Normocephalic and atraumatic.      Nose: No congestion/rhinnorhea.      Mouth/Throat: Mucous membranes are moist.      Neck: No stridor. Hematological/Lymphatic/Immunilogical: No cervical lymphadenopathy. Cardiovascular: Normal rate, regular rhythm.  No murmurs, rubs, or gallops. Respiratory: Normal respiratory effort without tachypnea nor retractions. Breath sounds are clear and equal bilaterally. No wheezes/rales/rhonchi. Gastrointestinal: Soft and non tender. No rebound. No guarding.  Genitourinary: Deferred Musculoskeletal: Normal range of motion in all extremities. No lower extremity edema. Neurologic:  Normal speech and language. No gross focal neurologic deficits are appreciated.  Skin:  Skin is warm, dry and intact. No rash noted. Psychiatric: Mood and affect are normal. Speech and behavior are normal. Patient exhibits appropriate insight and judgment.  ____________________________________________    LABS (pertinent positives/negatives)  Trop <0.03 CMP wnl except  glu 100, ca 8.8  ____________________________________________   EKG  I, Nance Pear, attending physician, personally viewed and interpreted this EKG  EKG Time: 1253 Rate: 59 Rhythm: sinus bradycardia Axis: normal Intervals: qtc 429 QRS: LVH ST changes: no st elevation Impression: abnormal ekg  ____________________________________________    RADIOLOGY  None  ____________________________________________   PROCEDURES  Procedures  ____________________________________________   INITIAL IMPRESSION / ASSESSMENT AND PLAN / ED COURSE  Pertinent labs & imaging results that were available during my care of the patient were reviewed by me and considered in my medical decision making (see chart for details).   Patient presented to the emergency department today because of concerns for high blood pressure.  Patient without any complaints of headache or chest pain.  Blood work without any signs of heart or kidney damage.  Discussed these findings with the patient.  At this point patient has uncomplicated hypertension.  Discussed that she could increase dose of her antihypertensives and I did recommend  her increase her Coreg to 6.250 for the next few days to see how that affected her blood pressure.  Did discuss return precautions with the patient.   ____________________________________________   FINAL CLINICAL IMPRESSION(S) / ED DIAGNOSES  Final diagnoses:  Hypertension, unspecified type     Note: This dictation was prepared with Dragon dictation. Any transcriptional errors that result from this process are unintentional     Nance Pear, MD 05/13/18 1514

## 2018-05-13 NOTE — ED Triage Notes (Signed)
First Nurse Note:  Arrives from [facility] for evaluation of HTN.  Patient is AAOx3.  Skin warm and dry. NAD

## 2019-02-05 ENCOUNTER — Other Ambulatory Visit: Payer: Self-pay | Admitting: Internal Medicine

## 2019-02-05 DIAGNOSIS — Z1231 Encounter for screening mammogram for malignant neoplasm of breast: Secondary | ICD-10-CM

## 2019-02-06 ENCOUNTER — Ambulatory Visit
Admission: RE | Admit: 2019-02-06 | Discharge: 2019-02-06 | Disposition: A | Discharge status: Home / Self Care | Payer: Medicare Other | Source: Ambulatory Visit | Attending: Internal Medicine | Admitting: Internal Medicine

## 2019-02-06 DIAGNOSIS — Z1231 Encounter for screening mammogram for malignant neoplasm of breast: Secondary | ICD-10-CM | POA: Insufficient documentation

## 2019-02-06 HISTORY — DX: Malignant neoplasm of unspecified site of unspecified female breast: C50.919

## 2019-02-06 HISTORY — DX: Personal history of antineoplastic chemotherapy: Z92.21

## 2019-08-01 ENCOUNTER — Ambulatory Visit: Payer: Medicare PPO | Attending: Internal Medicine

## 2019-08-01 DIAGNOSIS — Z23 Encounter for immunization: Secondary | ICD-10-CM | POA: Insufficient documentation

## 2019-08-01 NOTE — Progress Notes (Signed)
   Covid-19 Vaccination Clinic  Name:  Jenna Carlson    MRN: ID:[id number] DOB: 12-17-1950  08/01/2019  [name] was observed post Covid-19 immunization for 15 minutes without incidence. She was provided with Vaccine Information Sheet and instruction to access the V-Safe system.   [name] was instructed to call 911 with any severe reactions post vaccine: Marland Kitchen Difficulty breathing  . Swelling of your face and throat  . A fast heartbeat  . A bad rash all over your body  . Dizziness and weakness    Immunizations Administered    Name Date Dose VIS Date Route   Pfizer COVID-19 Vaccine 08/01/2019 11:33 AM 0.3 mL 05/30/2019 Intramuscular   Manufacturer: [place]   Lot: [id number]   [place]: [id number]

## 2019-08-26 ENCOUNTER — Ambulatory Visit: Payer: Medicare PPO | Attending: Internal Medicine

## 2019-08-26 DIAGNOSIS — Z23 Encounter for immunization: Secondary | ICD-10-CM

## 2019-08-26 NOTE — Progress Notes (Signed)
   Covid-19 Vaccination Clinic  Name:  Jenna Carlson    MRN: ID:[id number] DOB: Nov 09, 1950  08/26/2019  Jenna Carlson was observed post Covid-19 immunization for 15 minutes without incident. She was provided with Vaccine Information Sheet and instruction to access the V-Safe system.   Jenna Carlson was instructed to call 911 with any severe reactions post vaccine: Marland Kitchen Difficulty breathing  . Swelling of face and throat  . A fast heartbeat  . A bad rash all over body  . Dizziness and weakness   Immunizations Administered    Name Date Dose VIS Date Route   Pfizer COVID-19 Vaccine 08/26/2019 12:05 PM 0.3 mL 05/30/2019 Intramuscular   Manufacturer: [place]   Lot: [id number]   [place]: [id number]

## 2020-01-15 ENCOUNTER — Other Ambulatory Visit: Payer: Self-pay | Admitting: Internal Medicine

## 2020-01-15 DIAGNOSIS — Z1231 Encounter for screening mammogram for malignant neoplasm of breast: Secondary | ICD-10-CM

## 2020-02-09 ENCOUNTER — Other Ambulatory Visit: Payer: Self-pay

## 2020-02-09 ENCOUNTER — Ambulatory Visit
Admission: RE | Admit: 2020-02-09 | Discharge: 2020-02-09 | Disposition: A | Discharge status: Home / Self Care | Payer: Medicare PPO | Source: Ambulatory Visit | Attending: Internal Medicine | Admitting: Internal Medicine

## 2020-02-09 DIAGNOSIS — Z1231 Encounter for screening mammogram for malignant neoplasm of breast: Secondary | ICD-10-CM | POA: Insufficient documentation

## 2020-05-07 ENCOUNTER — Ambulatory Visit: Payer: Medicare PPO

## 2021-02-10 ENCOUNTER — Other Ambulatory Visit: Payer: Self-pay | Admitting: Internal Medicine

## 2021-02-10 DIAGNOSIS — Z1231 Encounter for screening mammogram for malignant neoplasm of breast: Secondary | ICD-10-CM

## 2021-02-25 ENCOUNTER — Ambulatory Visit
Admission: RE | Admit: 2021-02-25 | Discharge: 2021-02-25 | Disposition: A | Discharge status: Home / Self Care | Payer: Medicare PPO | Source: Ambulatory Visit | Attending: Internal Medicine | Admitting: Internal Medicine

## 2021-02-25 ENCOUNTER — Other Ambulatory Visit: Payer: Self-pay

## 2021-02-25 DIAGNOSIS — Z1231 Encounter for screening mammogram for malignant neoplasm of breast: Secondary | ICD-10-CM | POA: Insufficient documentation

## 2022-01-25 ENCOUNTER — Other Ambulatory Visit: Payer: Self-pay | Admitting: Internal Medicine

## 2022-01-25 DIAGNOSIS — Z1231 Encounter for screening mammogram for malignant neoplasm of breast: Secondary | ICD-10-CM

## 2022-02-27 ENCOUNTER — Ambulatory Visit
Admission: RE | Admit: 2022-02-27 | Discharge: 2022-02-27 | Disposition: A | Discharge status: Home / Self Care | Payer: Medicare PPO | Source: Ambulatory Visit | Attending: Internal Medicine | Admitting: Internal Medicine

## 2022-02-27 DIAGNOSIS — Z1231 Encounter for screening mammogram for malignant neoplasm of breast: Secondary | ICD-10-CM | POA: Insufficient documentation

## 2022-07-09 NOTE — H&P (Signed)
Pre-Procedure H&P   Patient ID: Jenna Carlson is a 72 y.o. female.  Gastroenterology Provider: Annamaria Helling, DO  Referring Provider: Laurine Blazer, PA PCP: Kirk Ruths, MD  Date: 07/10/2022  HPI Jenna Carlson is a 72 y.o. female who presents today for Esophagogastroduodenoscopy and Colonoscopy for Dysphagia; surveillance-personal history of colon polyps . Patient reports regular bowel movements daily without melena or hematochezia.  Occasionally she will have loose bowel movements at most 2 times a week associate with some abdominal cramping.  For that she takes Imodium with relief.  She has noted some solid foods specifically meat dysphagia.  Also notes some dysphagia with large pills.  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  She denies any odynophagia or reflux or weight loss.  On Plavix was been held for this procedure (last dose 07/03/22)`  Patient underwent EGD and colonoscopy in August 2018 demonstrating gastritis and duodenal ulcers and Schatzki's ring.  The Schatzki's ring was dilated with a 50 mm TTS balloon with good result.  Biopsies of the stomach demonstrated gastric intestinal metaplasia without H. pylori.  Colonoscopy was difficult as adult colonoscope was exchanged for pediatric scope and then a gastroscope in order to be able to bypass left-sided diverticulosis.  Only able to reach the ascending colon at that time where a tubular adenomatous polyp was found.  Negative family history for colon cancer or colon polyps  Also underwent colonoscopies in May 2010 and November 2005   Past Medical History:  Diagnosis Date   Allergic rhinitis    Asthma    Breast cancer ([place]) 1990's   right breast   Cancer ([place])    BREAST   Depression    Edema    GERD (gastroesophageal reflux disease)    Hyperlipidemia    Hypertension    Osteoporosis, postmenopausal    Personal history of  chemotherapy     Past Surgical History:  Procedure Laterality Date   COLONOSCOPY WITH PROPOFOL N/A 02/16/2017   Procedure: COLONOSCOPY WITH PROPOFOL;  Surgeon: Manya Silvas, MD;  Location: [facility] ENDOSCOPY;  Service: Endoscopy;  Laterality: N/A;   ESOPHAGOGASTRODUODENOSCOPY (EGD) WITH PROPOFOL N/A 02/16/2017   Procedure: ESOPHAGOGASTRODUODENOSCOPY (EGD) WITH PROPOFOL;  Surgeon: Manya Silvas, MD;  Location: [facility] ENDOSCOPY;  Service: Endoscopy;  Laterality: N/A;   MASTECTOMY     WRIST SURGERY Left     Family History No h/o GI disease or malignancy  Review of Systems  Constitutional:  Negative for activity change, appetite change, chills, diaphoresis, fatigue, fever and unexpected weight change.  HENT:  Positive for trouble swallowing. Negative for voice change.   Respiratory:  Negative for shortness of breath and wheezing.   Cardiovascular:  Negative for chest pain, palpitations and leg swelling.  Gastrointestinal:  Negative for abdominal distention, abdominal pain, anal bleeding, blood in stool, constipation, diarrhea, nausea, rectal pain and vomiting.  Musculoskeletal:  Negative for arthralgias and myalgias.  Skin:  Negative for color change and pallor.  Neurological:  Negative for dizziness, syncope and weakness.  Psychiatric/Behavioral:  Negative for confusion.   All other systems reviewed and are negative.    Medications No current facility-administered medications on file prior to encounter.   Current Outpatient Medications on File Prior to Encounter  Medication Sig Dispense Refill   carvedilol (COREG) 25 MG tablet Take 25 mg by mouth 2 (two) times daily with a meal.     fluticasone furoate-vilanterol (BREO ELLIPTA) 100-25  MCG/INH AEPB Inhale 1 puff into the lungs daily.     montelukast (SINGULAIR) 10 MG tablet Take 10 mg by mouth at bedtime.     potassium chloride 20 MEQ TBCR Take 20 mEq by mouth 2 (two) times daily. 30 tablet 0   sertraline (ZOLOFT) 50 MG tablet Take  50 mg by mouth daily.     simvastatin (ZOCOR) 20 MG tablet Take by mouth daily.      verapamil (CALAN-SR) 240 MG CR tablet Take 1 tablet (240 mg total) by mouth daily. 30 tablet 0   omeprazole (PRILOSEC) 40 MG capsule Take 40 mg by mouth 2 (two) times daily.     ondansetron (ZOFRAN ODT) 4 MG disintegrating tablet Take 1 tablet (4 mg total) by mouth every 8 (eight) hours as needed for nausea or vomiting. 20 tablet 0   ondansetron (ZOFRAN ODT) 4 MG disintegrating tablet Take 1 tablet (4 mg total) by mouth every 8 (eight) hours as needed for nausea or vomiting. 20 tablet 0    Pertinent medications related to GI and procedure were reviewed by me with the patient prior to the procedure   Current Facility-Administered Medications:    0.9 %  sodium chloride infusion, , Intravenous, Continuous, Annamaria Helling, DO, Last Rate: 20 mL/hr at 07/10/22 0747, 1,000 mL at 07/10/22 0747      Allergies  Allergen Reactions   Iodinated Contrast Media Shortness Of Breath   Aspirin Tinitus and Other (See Comments)    Tinnitus   Codeine    Eggs Or Egg-Derived Products Other (See Comments)    On testing   Hydrochlorothiazide Other (See Comments)   Morphine And Related Nausea And Vomiting   Allergies were reviewed by me prior to the procedure  Objective   Body mass index is 31.53 kg/m. Vitals:   07/10/22 0725  BP: 126/73  Pulse: 61  Resp: 18  Temp: (!) 96.8 F (36 C)  TempSrc: Temporal  SpO2: 98%  Weight: 87.3 kg  Height: 5' 5.5" (1.664 m)     Physical Exam Vitals and nursing note reviewed.  Constitutional:      General: She is not in acute distress.    Appearance: Normal appearance. She is obese. She is not ill-appearing, toxic-appearing or diaphoretic.  HENT:     Head: Normocephalic and atraumatic.     Nose: Nose normal.     Mouth/Throat:     Mouth: Mucous membranes are moist.     Pharynx: Oropharynx is clear.  Eyes:     General: No scleral icterus.    Extraocular  Movements: Extraocular movements intact.  Cardiovascular:     Rate and Rhythm: Normal rate and regular rhythm.     Heart sounds: Normal heart sounds. No murmur heard.    No friction rub. No gallop.  Pulmonary:     Effort: Pulmonary effort is normal. No respiratory distress.     Breath sounds: Normal breath sounds. No wheezing, rhonchi or rales.  Abdominal:     General: Bowel sounds are normal. There is no distension.     Palpations: Abdomen is soft.     Tenderness: There is no abdominal tenderness. There is no guarding or rebound.  Musculoskeletal:     Cervical back: Neck supple.     Right lower leg: No edema.     Left lower leg: No edema.  Skin:    General: Skin is warm and dry.     Coloration: Skin is not jaundiced or pale.  Neurological:  General: No focal deficit present.     Mental Status: She is alert and oriented to person, place, and time. Mental status is at baseline.  Psychiatric:        Mood and Affect: Mood normal.        Behavior: Behavior normal.        Thought Content: Thought content normal.        Judgment: Judgment normal.      Assessment:  Ms. KWYNN SCHLOTTER is a 72 y.o. female  who presents today for Esophagogastroduodenoscopy and Colonoscopy for Dysphagia; surveillance-personal history of colon polyps .  Plan:  Esophagogastroduodenoscopy and Colonoscopy with possible intervention today  Esophagogastroduodenoscopy and Colonoscopy with possible biopsy, control of bleeding, polypectomy, and interventions as necessary has been discussed with the patient/patient representative. Informed consent was obtained from the patient/patient representative after explaining the indication, nature, and risks of the procedure including but not limited to death, bleeding, perforation, missed neoplasm/lesions, cardiorespiratory compromise, and reaction to medications. Opportunity for questions was given and appropriate answers were provided. Patient/patient representative  has verbalized understanding is amenable to undergoing the procedure.   Annamaria Helling, DO  [facility]  Portions of the record may have been created with voice recognition software. Occasional wrong-word or 'sound-a-like' substitutions may have occurred due to the inherent limitations of voice recognition software.  Read the chart carefully and recognize, using context, where substitutions may have occurred.

## 2022-07-10 ENCOUNTER — Ambulatory Visit
Admission: RE | Admit: 2022-07-10 | Discharge: 2022-07-10 | Disposition: A | Discharge status: Home / Self Care | Payer: Medicare PPO | Source: Ambulatory Visit | Attending: Gastroenterology | Admitting: Gastroenterology

## 2022-07-10 ENCOUNTER — Encounter: Payer: Self-pay | Admitting: Gastroenterology

## 2022-07-10 ENCOUNTER — Ambulatory Visit: Payer: Medicare PPO | Admitting: Anesthesiology

## 2022-07-10 ENCOUNTER — Encounter
Admission: RE | Disposition: A | Discharge status: Home / Self Care | Payer: Self-pay | Source: Ambulatory Visit | Attending: Gastroenterology

## 2022-07-10 DIAGNOSIS — D128 Benign neoplasm of rectum: Secondary | ICD-10-CM | POA: Insufficient documentation

## 2022-07-10 DIAGNOSIS — Z79899 Other long term (current) drug therapy: Secondary | ICD-10-CM | POA: Diagnosis not present

## 2022-07-10 DIAGNOSIS — Z7902 Long term (current) use of antithrombotics/antiplatelets: Secondary | ICD-10-CM | POA: Diagnosis not present

## 2022-07-10 DIAGNOSIS — K449 Diaphragmatic hernia without obstruction or gangrene: Secondary | ICD-10-CM | POA: Insufficient documentation

## 2022-07-10 DIAGNOSIS — F32A Depression, unspecified: Secondary | ICD-10-CM | POA: Insufficient documentation

## 2022-07-10 DIAGNOSIS — K2100 Gastro-esophageal reflux disease with esophagitis, without bleeding: Secondary | ICD-10-CM | POA: Insufficient documentation

## 2022-07-10 DIAGNOSIS — K2970 Gastritis, unspecified, without bleeding: Secondary | ICD-10-CM | POA: Diagnosis not present

## 2022-07-10 DIAGNOSIS — Z86010 Personal history of colonic polyps: Secondary | ICD-10-CM | POA: Diagnosis not present

## 2022-07-10 DIAGNOSIS — K5730 Diverticulosis of large intestine without perforation or abscess without bleeding: Secondary | ICD-10-CM | POA: Diagnosis not present

## 2022-07-10 DIAGNOSIS — Z9221 Personal history of antineoplastic chemotherapy: Secondary | ICD-10-CM | POA: Diagnosis not present

## 2022-07-10 DIAGNOSIS — D120 Benign neoplasm of cecum: Secondary | ICD-10-CM | POA: Diagnosis not present

## 2022-07-10 DIAGNOSIS — E785 Hyperlipidemia, unspecified: Secondary | ICD-10-CM | POA: Insufficient documentation

## 2022-07-10 DIAGNOSIS — K641 Second degree hemorrhoids: Secondary | ICD-10-CM | POA: Insufficient documentation

## 2022-07-10 DIAGNOSIS — D122 Benign neoplasm of ascending colon: Secondary | ICD-10-CM | POA: Diagnosis not present

## 2022-07-10 DIAGNOSIS — Z853 Personal history of malignant neoplasm of breast: Secondary | ICD-10-CM | POA: Insufficient documentation

## 2022-07-10 DIAGNOSIS — K224 Dyskinesia of esophagus: Secondary | ICD-10-CM | POA: Diagnosis not present

## 2022-07-10 DIAGNOSIS — Z1211 Encounter for screening for malignant neoplasm of colon: Secondary | ICD-10-CM | POA: Diagnosis not present

## 2022-07-10 DIAGNOSIS — I10 Essential (primary) hypertension: Secondary | ICD-10-CM | POA: Insufficient documentation

## 2022-07-10 DIAGNOSIS — K3189 Other diseases of stomach and duodenum: Secondary | ICD-10-CM | POA: Diagnosis not present

## 2022-07-10 DIAGNOSIS — J45909 Unspecified asthma, uncomplicated: Secondary | ICD-10-CM | POA: Insufficient documentation

## 2022-07-10 HISTORY — PX: ESOPHAGOGASTRODUODENOSCOPY: SHX5428

## 2022-07-10 HISTORY — PX: COLONOSCOPY: SHX5424

## 2022-07-10 SURGERY — COLONOSCOPY
Anesthesia: General

## 2022-07-10 MED ORDER — PROPOFOL 10 MG/ML IV BOLUS
INTRAVENOUS | Status: DC | PRN
  Administered 2022-07-10 (×2): 40 mg via INTRAVENOUS

## 2022-07-10 MED ORDER — DEXMEDETOMIDINE HCL IN NACL 200 MCG/50ML IV SOLN
INTRAVENOUS | Status: DC | PRN
  Administered 2022-07-10 (×2): 4 ug via INTRAVENOUS

## 2022-07-10 MED ORDER — LIDOCAINE HCL (CARDIAC) PF 100 MG/5ML IV SOSY
PREFILLED_SYRINGE | INTRAVENOUS | Status: DC | PRN
  Administered 2022-07-10: 100 mg via INTRAVENOUS

## 2022-07-10 MED ORDER — SODIUM CHLORIDE 0.9 % IV SOLN
INTRAVENOUS | Status: DC
  Administered 2022-07-10: 1000 mL via INTRAVENOUS

## 2022-07-10 MED ORDER — PROPOFOL 500 MG/50ML IV EMUL
INTRAVENOUS | Status: DC | PRN
  Administered 2022-07-10: 200 ug/kg/min via INTRAVENOUS

## 2022-07-10 NOTE — Anesthesia Preprocedure Evaluation (Signed)
Anesthesia Evaluation  Patient identified by MRN, date of birth, ID band Patient awake    Reviewed: Allergy & Precautions, NPO status , Patient's Chart, lab work & pertinent test results  Airway Mallampati: III  TM Distance: <3 FB Neck ROM: full    Dental  (+) Caps   Pulmonary neg pulmonary ROS, asthma    Pulmonary exam normal breath sounds clear to auscultation       Cardiovascular Exercise Tolerance: Good hypertension, Pt. on medications negative cardio ROS Normal cardiovascular exam Rhythm:Regular     Neuro/Psych    Depression    TIAnegative neurological ROS  negative psych ROS   GI/Hepatic negative GI ROS, Neg liver ROS, PUD,GERD  Medicated,,  Endo/Other  negative endocrine ROS    Renal/GU negative Renal ROS  negative genitourinary   Musculoskeletal   Abdominal Normal abdominal exam  (+)   Peds negative pediatric ROS (+)  Hematology negative hematology ROS (+)   Anesthesia Other Findings Past Medical History: No date: Allergic rhinitis No date: Asthma 1990's: Breast cancer ([place])     Comment:  right breast No date: Cancer ([place])     Comment:  BREAST No date: Depression No date: Edema No date: GERD (gastroesophageal reflux disease) No date: Hyperlipidemia No date: Hypertension 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, postmenopausal No date: Personal history of chemotherapy  Past Surgical History: 02/16/2017: COLONOSCOPY WITH PROPOFOL; N/A     Comment:  Procedure: COLONOSCOPY WITH PROPOFOL;  Surgeon: Manya Silvas, MD;  Location: [facility] ENDOSCOPY;  Service:               Endoscopy;  Laterality: N/A; 02/16/2017: ESOPHAGOGASTRODUODENOSCOPY (EGD) WITH PROPOFOL; N/A     Comment:  Procedure: ESOPHAGOGASTRODUODENOSCOPY (EGD) WITH               PROPOFOL;  Surgeon: Manya Silvas, MD;  Location:               [facility] ENDOSCOPY;  Service: Endoscopy;  Laterality: N/A; No date: MASTECTOMY No date: WRIST SURGERY;  Left     Reproductive/Obstetrics negative OB ROS                             Anesthesia Physical Anesthesia Plan  ASA: 3  Anesthesia Plan: General   Post-op Pain Management:    Induction: Intravenous  PONV Risk Score and Plan: Propofol infusion and TIVA  Airway Management Planned: Natural Airway  Additional Equipment:   Intra-op Plan:   Post-operative Plan:   Informed Consent: I have reviewed the patients History and Physical, chart, labs and discussed the procedure including the risks, benefits and alternatives for the proposed anesthesia with the patient or authorized representative who has indicated his/her understanding and acceptance.     Dental Advisory Given  Plan Discussed with: CRNA and Surgeon  Anesthesia Plan Comments:        Anesthesia Quick Evaluation

## 2022-07-10 NOTE — Op Note (Signed)
[facility] Gastroenterology Patient Name: Jenna Carlson Procedure Date: 07/10/2022 8:24 AM MRN: [id number] Account #: [id number] Date of Birth: 1951/04/22 Admit Type: Outpatient Age: 72 Room: [facility] ENDO ROOM 2 Gender: Female Note Status: Finalized Instrument Name: Peds Colonoscope [id number] Procedure:             Colonoscopy Indications:           High risk colon cancer surveillance: Personal history                         of colonic polyps Providers:             Rueben Bash, DO Referring MD:          Ocie Cornfield. Ouida Sills MD, MD (Referring MD) Medicines:             Monitored Anesthesia Care Complications:         No immediate complications. Estimated blood loss:                         Minimal. Procedure:             Pre-Anesthesia Assessment:                        - Prior to the procedure, a History and Physical was                         performed, and patient medications and allergies were                         reviewed. The patient is competent. The risks and                         benefits of the procedure and the sedation options and                         risks were discussed with the patient. All questions                         were answered and informed consent was obtained.                         Patient identification and proposed procedure were                         verified by the physician, the nurse, the anesthetist                         and the technician in the endoscopy suite. Mental                         Status Examination: alert and oriented. Airway                         Examination: normal oropharyngeal airway and neck                         mobility. Respiratory Examination: clear to  auscultation. CV Examination: RRR, no murmurs, no S3                         or S4. Prophylactic Antibiotics: The patient does not                         require prophylactic antibiotics. Prior                          Anticoagulants: The patient has taken Plavix                         (clopidogrel), last dose was 7 days prior to                         procedure. ASA Grade Assessment: III - A patient with                         severe systemic disease. After reviewing the risks and                         benefits, the patient was deemed in satisfactory                         condition to undergo the procedure. The anesthesia                         plan was to use monitored anesthesia care (MAC).                         Immediately prior to administration of medications,                         the patient was re-assessed for adequacy to receive                         sedatives. The heart rate, respiratory rate, oxygen                         saturations, blood pressure, adequacy of pulmonary                         ventilation, and response to care were monitored                         throughout the procedure. The physical status of the                         patient was re-assessed after the procedure.                        After obtaining informed consent, the colonoscope was                         passed under direct vision. Throughout the procedure,                         the patient's blood pressure, pulse, and oxygen  saturations were monitored continuously. The                         Colonoscope was introduced through the anus and                         advanced to the the terminal ileum, with                         identification of the appendiceal orifice and IC                         valve. The colonoscopy was somewhat difficult due to                         multiple diverticula in the colon. Successful                         completion of the procedure was aided by applying                         abdominal pressure and lavage. The patient tolerated                         the procedure well. The quality of the bowel                          preparation was evaluated using the BBPS [facility] Bowel                         Preparation Scale) with scores of: Right Colon = 2                         (minor amount of residual staining, small fragments of                         stool and/or opaque liquid, but mucosa seen well),                         Transverse Colon = 2 (minor amount of residual                         staining, small fragments of stool and/or opaque                         liquid, but mucosa seen well) and Left Colon = 2                         (minor amount of residual staining, small fragments of                         stool and/or opaque liquid, but mucosa seen well). The                         total BBPS score equals 6. The quality of the bowel  preparation was good. The terminal ileum, ileocecal                         valve, appendiceal orifice, and rectum were                         photographed. Findings:      The perianal and digital rectal examinations were normal. Pertinent       negatives include normal sphincter tone.      The terminal ileum appeared normal.      Multiple small-mouthed diverticula were found in the left colon. Severe       with narrowing of the sigmoid colon Estimated blood loss: none.      Non-bleeding internal hemorrhoids were found during retroflexion. The       hemorrhoids were Grade II (internal hemorrhoids that prolapse but reduce       spontaneously). Estimated blood loss: none.      A 1 to 2 mm polyp was found in the cecum. The polyp was sessile. The       polyp was removed with a jumbo cold forceps. Resection and retrieval       were complete. Estimated blood loss was minimal.      A 16 to 18 mm polyp was found in the proximal ascending colon. The polyp       was sessile. The polyp was removed with a piecemeal technique using a       cold snare. Resection and retrieval were complete. Edges were removed       with forceps. STSC was applied to the  edges as well. Estimated blood       loss was minimal. To prevent bleeding after the polypectomy, four       hemostatic clips were successfully placed (MR conditional). There was no       bleeding at the end of the procedure. Estimated blood loss was minimal.      Normal mucosa was found in the entire colon. Biopsies for histology were       taken with a cold forceps from the right colon and left colon for       evaluation of microscopic colitis. Estimated blood loss was minimal.      Three sessile polyps were found in the rectum (2) and transverse colon.       The polyps were 1 to 2 mm in size. These polyps were removed with a       jumbo cold forceps. Resection and retrieval were complete. Estimated       blood loss was minimal.      A 3 to 4 mm polyp was found in the transverse colon. The polyp was       sessile. The polyp was removed with a cold snare. Resection and       retrieval were complete. Estimated blood loss was minimal.      The exam was otherwise without abnormality on direct and retroflexion       views.      There was a small lipoma, in the ascending colon. Estimated blood loss:       none. Impression:            - The examined portion of the ileum was normal.                        - Diverticulosis  in the left colon.                        - Non-bleeding internal hemorrhoids.                        - One 1 to 2 mm polyp in the cecum, removed with a                         jumbo cold forceps. Resected and retrieved.                        - One 16 to 18 mm polyp in the proximal ascending                         colon, removed piecemeal using a cold snare. Resected                         and retrieved. Clips (MR conditional) were placed.                        - Normal mucosa in the entire examined colon. Biopsied.                        - Three 1 to 2 mm polyps in the rectum and in the                         transverse colon, removed with a jumbo cold forceps.                          Resected and retrieved.                        - One 3 to 4 mm polyp in the transverse colon, removed                         with a cold snare. Resected and retrieved.                        - The examination was otherwise normal on direct and                         retroflexion views.                        - Small lipoma in the ascending colon. Recommendation:        - Patient has a contact number available for                         emergencies. The signs and symptoms of potential                         delayed complications were discussed with the patient.                         Return to normal activities tomorrow. Written  discharge instructions were provided to the patient.                        - Discharge patient to home.                        - Resume previous diet.                        - Continue present medications.                        - No aspirin, ibuprofen, naproxen, or other                         non-steroidal anti-inflammatory drugs for 5 days after                         polyp removal.                        - Resume Plavix (clopidogrel) at prior dose in 5 days.                         Refer to managing physician for further adjustment of                         therapy.                        - Await pathology results.                        - Repeat colonoscopy in 6 months to review the                         polypectomy site.                        - Return to GI office as previously scheduled.                        - The findings and recommendations were discussed with                         the patient. Procedure Code(s):     --- Professional ---                        [phone], Colonoscopy, flexible; with removal of                         tumor(s), polyp(s), or other lesion(s) by snare                         technique                        41660, 32, Colonoscopy, flexible; with biopsy, single                          or multiple Diagnosis Code(s):     --- Professional ---  Z86.010, Personal history of colonic polyps                        K64.1, Second degree hemorrhoids                        D12.0, Benign neoplasm of cecum                        D12.2, Benign neoplasm of ascending colon                        D12.3, Benign neoplasm of transverse colon (hepatic                         flexure or splenic flexure)                        D12.8, Benign neoplasm of rectum                        D17.5, Benign lipomatous neoplasm of intra-abdominal                         organs                        K57.30, Diverticulosis of large intestine without                         perforation or abscess without bleeding CPT copyright 2022 [employer]. All rights reserved. The codes documented in this report are preliminary and upon coder review may  be revised to meet current compliance requirements. Attending Participation:      I personally performed the entire procedure. Volney American, DO Annamaria Helling DO, DO 07/10/2022 10:01:20 AM This report has been signed electronically. Number of Addenda: 0 Note Initiated On: 07/10/2022 8:24 AM Scope Withdrawal Time: 0 hours 47 minutes 52 seconds  Total Procedure Duration: 0 hours 58 minutes 58 seconds  Estimated Blood Loss:  Estimated blood loss was minimal.      [facility]

## 2022-07-10 NOTE — Op Note (Signed)
[facility] Gastroenterology Patient Name: Jenna Carlson Procedure Date: 07/10/2022 8:25 AM MRN: [id number] Account #: [id number] Date of Birth: 06-01-1951 Admit Type: Outpatient Age: 72 Room: [facility] ENDO ROOM 2 Gender: Female Note Status: Finalized Instrument Name: Michaelle Birks [id number] Procedure:             Upper GI endoscopy Indications:           Dysphagia Providers:             Rueben Bash, DO Referring MD:          Ocie Cornfield. Ouida Sills MD, MD (Referring MD) Medicines:             Monitored Anesthesia Care Complications:         No immediate complications. Estimated blood loss:                         Minimal. Procedure:             Pre-Anesthesia Assessment:                        - Prior to the procedure, a History and Physical was                         performed, and patient medications and allergies were                         reviewed. The patient is competent. The risks and                         benefits of the procedure and the sedation options and                         risks were discussed with the patient. All questions                         were answered and informed consent was obtained.                         Patient identification and proposed procedure were                         verified by the physician, the nurse, the anesthetist                         and the technician in the endoscopy suite. Mental                         Status Examination: alert and oriented. Airway                         Examination: normal oropharyngeal airway and neck                         mobility. Respiratory Examination: clear to                         auscultation. CV Examination: RRR, no murmurs, no S3  or S4. Prophylactic Antibiotics: The patient does not                         require prophylactic antibiotics. Prior                         Anticoagulants: The patient has taken Plavix                          (clopidogrel), last dose was 7 days prior to                         procedure. ASA Grade Assessment: III - A patient with                         severe systemic disease. After reviewing the risks and                         benefits, the patient was deemed in satisfactory                         condition to undergo the procedure. The anesthesia                         plan was to use monitored anesthesia care (MAC).                         Immediately prior to administration of medications,                         the patient was re-assessed for adequacy to receive                         sedatives. The heart rate, respiratory rate, oxygen                         saturations, blood pressure, adequacy of pulmonary                         ventilation, and response to care were monitored                         throughout the procedure. The physical status of the                         patient was re-assessed after the procedure.                        After obtaining informed consent, the endoscope was                         passed under direct vision. Throughout the procedure,                         the patient's blood pressure, pulse, and oxygen                         saturations were monitored continuously. The Endoscope  was introduced through the mouth, and advanced to the                         second part of duodenum. The upper GI endoscopy was                         accomplished without difficulty. The patient tolerated                         the procedure well. Findings:      The duodenal bulb, first portion of the duodenum and second portion of       the duodenum were normal. Estimated blood loss: none.      Localized mild inflammation characterized by erythema was found in the       gastric antrum. Biopsies were taken with a cold forceps for Helicobacter       pylori testing. Estimated blood loss was minimal.      Normal mucosa was found in the  cardia, in the gastric fundus and in the       gastric body. Biopsies were taken with a cold forceps for histology.       Estimated blood loss was minimal.      A 4 cm hiatal hernia was present. Estimated blood loss: none.      The Z-line was regular. Estimated blood loss: none.      Esophagogastric landmarks were identified: the gastroesophageal junction       was found at 35 cm from the incisors.      LA Grade A (one or more mucosal breaks less than 5 mm, not extending       between tops of 2 mucosal folds) esophagitis with no bleeding was found.       Estimated blood loss: none.      One benign-appearing, intrinsic severe (stenosis; an endoscope cannot       pass) stenosis was found 35 cm from the incisors. This stenosis measured       1 cm (inner diameter) x less than one cm (in length). The stenosis was       traversed after dilation. A TTS dilator was passed through the scope.       Dilation with a 03-30-11 mm balloon dilator was performed to 10 mm, 11       mm and 12 mm. The dilation site was examined following endoscope       reinsertion and showed mild mucosal disruption. Estimated blood loss was       minimal.      Abnormal motility was noted in the esophagus. The cricopharyngeus was       normal. There is spasticity of the esophageal body. The distal       esophagus/lower esophageal sphincter is open. Tertiary peristaltic waves       are noted. Estimated blood loss: none. Impression:            - Normal duodenal bulb, first portion of the duodenum                         and second portion of the duodenum.                        - Gastritis. Biopsied.                        -  Normal mucosa was found in the cardia, in the                         gastric fundus and in the gastric body. Biopsied.                        - 4 cm hiatal hernia.                        - Z-line regular.                        - Esophagogastric landmarks identified.                        - LA Grade A  esophagitis with no bleeding.                        - Benign-appearing esophageal stenosis. Dilated.                        - Abnormal esophageal motility, suspicious for                         presbyesophagus. Recommendation:        - Patient has a contact number available for                         emergencies. The signs and symptoms of potential                         delayed complications were discussed with the patient.                         Return to normal activities tomorrow. Written                         discharge instructions were provided to the patient.                        - Discharge patient to home.                        - Soft diet today.                        - Continue present medications.                        - See colonoscopy report for plavix recommendations.                        proceed with colonoscopy.                        - Await pathology results.                        - Repeat upper endoscopy 2-4 weeks for retreatment.                        - Return to GI office as  previously scheduled.                        - The findings and recommendations were discussed with                         the patient. Procedure Code(s):     --- Professional ---                        [phone], Esophagogastroduodenoscopy, flexible,                         transoral; with transendoscopic balloon dilation of                         esophagus (less than 30 mm diameter)                        43239, 59,51, Esophagogastroduodenoscopy, flexible,                         transoral; with biopsy, single or multiple Diagnosis Code(s):     --- Professional ---                        K29.70, Gastritis, unspecified, without bleeding                        K44.9, Diaphragmatic hernia without obstruction or                         gangrene                        K20.90, Esophagitis, unspecified without bleeding                        K22.2, Esophageal obstruction                         K22.4, Dyskinesia of esophagus                        R13.10, Dysphagia, unspecified CPT copyright 2022 American Medical Association. All rights reserved. The codes documented in this report are preliminary and upon coder review may  be revised to meet current compliance requirements. Attending Participation:      I personally performed the entire procedure. Volney American, DO Annamaria Helling DO, DO 07/10/2022 8:50:22 AM This report has been signed electronically. Number of Addenda: 0 Note Initiated On: 07/10/2022 8:25 AM Estimated Blood Loss:  Estimated blood loss was minimal.      [facility]

## 2022-07-10 NOTE — Transfer of Care (Signed)
Immediate Anesthesia Transfer of Care Note  Patient: Jenna Carlson  Procedure(s) Performed: COLONOSCOPY ESOPHAGOGASTRODUODENOSCOPY (EGD)  Patient Location: PACU  Anesthesia Type:General  Level of Consciousness: awake and alert   Airway & Oxygen Therapy: Patient Spontanous Breathing  Post-op Assessment: Report given to RN and Post -op Vital signs reviewed and stable  Post vital signs: stable  Last Vitals:  Vitals Value Taken Time  BP    Temp    Pulse 65 07/10/22 0957  Resp 15 07/10/22 0957  SpO2 92 % 07/10/22 0957  Vitals shown include unvalidated device data.  Last Pain:  Vitals:   07/10/22 0725  TempSrc: Temporal         Complications: No notable events documented.

## 2022-07-10 NOTE — Interval H&P Note (Signed)
History and Physical Interval Note: Preprocedure H&P from 07/10/22  was reviewed and there was no interval change after seeing and examining the patient.  Written consent was obtained from the patient after discussion of risks, benefits, and alternatives. Patient has consented to proceed with Esophagogastroduodenoscopy and Colonoscopy with possible intervention   07/10/2022 8:26 AM  [place]  has presented today for surgery, with the diagnosis of Dysphagia, unspecified type (R13.10) Personal history of colonic polyps (Z86.010).  The various methods of treatment have been discussed with the patient and family. After consideration of risks, benefits and other options for treatment, the patient has consented to  Procedure(s): COLONOSCOPY (N/A) ESOPHAGOGASTRODUODENOSCOPY (EGD) (N/A) as a surgical intervention.  The patient's history has been reviewed, patient examined, no change in status, stable for surgery.  I have reviewed the patient's chart and labs.  Questions were answered to the patient's satisfaction.     Annamaria Helling

## 2022-07-10 NOTE — Anesthesia Postprocedure Evaluation (Signed)
Anesthesia Post Note  Patient: Jenna Carlson  Procedure(s) Performed: COLONOSCOPY ESOPHAGOGASTRODUODENOSCOPY (EGD)  Patient location during evaluation: PACU Anesthesia Type: General Level of consciousness: awake and sedated Pain management: pain level controlled Respiratory status: spontaneous breathing Cardiovascular status: blood pressure returned to baseline and stable Anesthetic complications: no  There were no known notable events for this encounter.   Last Vitals:  Vitals:   07/10/22 0957 07/10/22 1016  BP: 128/66 (!) 166/85  Pulse:  62  Resp:    Temp: (!) 35.7 C   SpO2:      Last Pain:  Vitals:   07/10/22 1016  TempSrc:   PainSc: 0-No pain                 [name],Laneice Meneely

## 2022-07-11 ENCOUNTER — Encounter: Payer: Self-pay | Admitting: Gastroenterology

## 2022-07-11 LAB — SURGICAL PATHOLOGY

## 2022-07-21 ENCOUNTER — Encounter: Payer: Self-pay | Admitting: Gastroenterology

## 2022-07-23 NOTE — H&P (Signed)
Correction to 07/10/22 H&P - Schatski's ring in 2018 was dilated with 15 mm TTS balloon (not 42m as voice to text incorrectly placed).  SRonne Binning [facility]

## 2022-07-23 NOTE — H&P (Signed)
Pre-Procedure H&P   Patient ID: Jenna Carlson is a 72 y.o. female.  Gastroenterology Provider: Annamaria Helling, DO  Referring Provider: Laurine Blazer, PA PCP: Kirk Ruths, MD  Date: 07/24/2022  HPI Jenna Carlson is a 72 y.o. female who presents today for Esophagogastroduodenoscopy for Dysphagia, esophageal stenosis   Patient underwent EGD secondary to dysphagia on January 22.  At that point in time she was noted to have a 4 cm hiatal hernia, LA grade a esophagitis, H. pylori negative gastritis.  She was also noted to have a severe stenosis with a adult gastroscope would not pass until dilated.  A through-the-scope balloon was used dilating at 10, 11, 12 mm with superficial mucosal disruption occurring at 12 mm.  The scope was unable to pass and complete the exam with findings as stated.  Most of her dysphagia is to meat and pills.  She has no odynophagia.  Feels like things stick in her upper esophagus that require large amounts of drinking liquids to pass.  No reflux or weight loss.  Patient is currently held her Plavix for 7 days  Past Medical History:  Diagnosis Date   Allergic rhinitis    Asthma    Breast cancer ([place]) 1990's   right breast   Cancer ([place])    BREAST   Depression    Edema    GERD (gastroesophageal reflux disease)    Hyperlipidemia    Hypertension    Osteoporosis, postmenopausal    Personal history of chemotherapy     Past Surgical History:  Procedure Laterality Date   COLONOSCOPY N/A 07/10/2022   Procedure: COLONOSCOPY;  Surgeon: Annamaria Helling, DO;  Location: [facility] ENDOSCOPY;  Service: Gastroenterology;  Laterality: N/A;   COLONOSCOPY WITH PROPOFOL N/A 02/16/2017   Procedure: COLONOSCOPY WITH PROPOFOL;  Surgeon: Manya Silvas, MD;  Location: [facility] ENDOSCOPY;  Service: Endoscopy;  Laterality: N/A;   ESOPHAGOGASTRODUODENOSCOPY N/A 07/10/2022   Procedure: ESOPHAGOGASTRODUODENOSCOPY (EGD);  Surgeon: Annamaria Helling, DO;   Location: [facility] ENDOSCOPY;  Service: Gastroenterology;  Laterality: N/A;   ESOPHAGOGASTRODUODENOSCOPY (EGD) WITH PROPOFOL N/A 02/16/2017   Procedure: ESOPHAGOGASTRODUODENOSCOPY (EGD) WITH PROPOFOL;  Surgeon: Manya Silvas, MD;  Location: [facility] ENDOSCOPY;  Service: Endoscopy;  Laterality: N/A;   MASTECTOMY     WRIST SURGERY Left     Family History No h/o GI disease or malignancy  Review of Systems  Constitutional:  Negative for activity change, appetite change, chills, diaphoresis, fatigue, fever and unexpected weight change.  HENT:  Positive for trouble swallowing. Negative for voice change.   Respiratory:  Negative for shortness of breath and wheezing.   Cardiovascular:  Negative for chest pain, palpitations and leg swelling.  Gastrointestinal:  Negative for abdominal distention, abdominal pain, anal bleeding, blood in stool, constipation, diarrhea, nausea, rectal pain and vomiting.  Musculoskeletal:  Negative for arthralgias and myalgias.  Skin:  Negative for color change and pallor.  Neurological:  Negative for dizziness, syncope and weakness.  Psychiatric/Behavioral:  Negative for confusion.   All other systems reviewed and are negative.    Medications No current facility-administered medications on file prior to encounter.   Current Outpatient Medications on File Prior to Encounter  Medication Sig Dispense Refill   carvedilol (COREG) 25 MG tablet Take 25 mg by mouth 2 (two) times daily with a meal.     fluticasone furoate-vilanterol (BREO ELLIPTA) 100-25 MCG/INH AEPB Inhale 1 puff into the lungs daily.     montelukast (SINGULAIR) 10 MG tablet Take 10  mg by mouth at bedtime.     potassium chloride 20 MEQ TBCR Take 20 mEq by mouth 2 (two) times daily. 30 tablet 0   sertraline (ZOLOFT) 50 MG tablet Take 50 mg by mouth daily.     simvastatin (ZOCOR) 20 MG tablet Take by mouth daily.      verapamil (CALAN-SR) 240 MG CR tablet Take 1 tablet (240 mg total) by mouth daily. 30 tablet  0   omeprazole (PRILOSEC) 40 MG capsule Take 40 mg by mouth 2 (two) times daily.     ondansetron (ZOFRAN ODT) 4 MG disintegrating tablet Take 1 tablet (4 mg total) by mouth every 8 (eight) hours as needed for nausea or vomiting. 20 tablet 0   ondansetron (ZOFRAN ODT) 4 MG disintegrating tablet Take 1 tablet (4 mg total) by mouth every 8 (eight) hours as needed for nausea or vomiting. 20 tablet 0    Pertinent medications related to GI and procedure were reviewed by me with the patient prior to the procedure   Current Facility-Administered Medications:    0.9 %  sodium chloride infusion, , Intravenous, Continuous, Annamaria Helling, DO, Last Rate: 20 mL/hr at 07/24/22 3300, Continued from Pre-op at 07/24/22 7622      Allergies  Allergen Reactions   Iodinated Contrast Media Shortness Of Breath   Aspirin Tinitus and Other (See Comments)    Tinnitus   Codeine    Eggs Or Egg-Derived Products Other (See Comments)    On testing   Hydrochlorothiazide Other (See Comments)   Morphine And Related Nausea And Vomiting   Allergies were reviewed by me prior to the procedure  Objective   Body mass index is 32.18 kg/m. Vitals:   07/24/22 0936  BP: (!) 150/76  Pulse: 64  Resp: 18  Temp: (!) 96 F (35.6 C)  TempSrc: Temporal  SpO2: 98%  Weight: 87.7 kg  Height: '5\' 5"'$  (1.651 m)     Physical Exam Vitals and nursing note reviewed.  Constitutional:      General: She is not in acute distress.    Appearance: Normal appearance. She is obese. She is not ill-appearing, toxic-appearing or diaphoretic.  HENT:     Head: Normocephalic and atraumatic.     Nose: Nose normal.     Mouth/Throat:     Mouth: Mucous membranes are moist.     Pharynx: Oropharynx is clear.  Eyes:     General: No scleral icterus.    Extraocular Movements: Extraocular movements intact.  Cardiovascular:     Rate and Rhythm: Normal rate and regular rhythm.     Heart sounds: Normal heart sounds. No murmur heard.     No friction rub. No gallop.  Pulmonary:     Effort: Pulmonary effort is normal. No respiratory distress.     Breath sounds: Normal breath sounds. No wheezing, rhonchi or rales.  Abdominal:     General: Bowel sounds are normal. There is no distension.     Palpations: Abdomen is soft.     Tenderness: There is no abdominal tenderness. There is no guarding or rebound.  Musculoskeletal:     Cervical back: Neck supple.     Right lower leg: No edema.     Left lower leg: No edema.  Skin:    General: Skin is warm and dry.     Coloration: Skin is not jaundiced or pale.  Neurological:     General: No focal deficit present.     Mental Status: She is alert and  oriented to person, place, and time. Mental status is at baseline.  Psychiatric:        Mood and Affect: Mood normal.        Behavior: Behavior normal.        Thought Content: Thought content normal.        Judgment: Judgment normal.      Assessment:  Ms. Jenna Carlson is a 72 y.o. female  who presents today for Esophagogastroduodenoscopy for Dysphagia, esophageal stenosis .  Plan:  Esophagogastroduodenoscopy with possible intervention today  Esophagogastroduodenoscopy with possible biopsy, control of bleeding, polypectomy, and interventions as necessary has been discussed with the patient/patient representative. Informed consent was obtained from the patient/patient representative after explaining the indication, nature, and risks of the procedure including but not limited to death, bleeding, perforation, missed neoplasm/lesions, cardiorespiratory compromise, and reaction to medications. Opportunity for questions was given and appropriate answers were provided. Patient/patient representative has verbalized understanding is amenable to undergoing the procedure.   Annamaria Helling, DO  [facility] Gastroenterology  Portions of the record may have been created with voice recognition software. Occasional wrong-word or  'sound-a-like' substitutions may have occurred due to the inherent limitations of voice recognition software.  Read the chart carefully and recognize, using context, where substitutions may have occurred.

## 2022-07-24 ENCOUNTER — Encounter
Admission: RE | Disposition: A | Discharge status: Home / Self Care | Payer: Self-pay | Source: Home / Self Care | Attending: Gastroenterology

## 2022-07-24 ENCOUNTER — Ambulatory Visit: Payer: Medicare PPO | Admitting: Certified Registered Nurse Anesthetist

## 2022-07-24 ENCOUNTER — Ambulatory Visit
Admission: RE | Admit: 2022-07-24 | Discharge: 2022-07-24 | Disposition: A | Discharge status: Home / Self Care | Payer: Medicare PPO | Attending: Gastroenterology | Admitting: Gastroenterology

## 2022-07-24 ENCOUNTER — Encounter: Payer: Self-pay | Admitting: Gastroenterology

## 2022-07-24 DIAGNOSIS — Z7902 Long term (current) use of antithrombotics/antiplatelets: Secondary | ICD-10-CM | POA: Diagnosis not present

## 2022-07-24 DIAGNOSIS — K449 Diaphragmatic hernia without obstruction or gangrene: Secondary | ICD-10-CM | POA: Diagnosis not present

## 2022-07-24 DIAGNOSIS — Z08 Encounter for follow-up examination after completed treatment for malignant neoplasm: Secondary | ICD-10-CM | POA: Insufficient documentation

## 2022-07-24 DIAGNOSIS — K222 Esophageal obstruction: Secondary | ICD-10-CM | POA: Diagnosis present

## 2022-07-24 DIAGNOSIS — Z853 Personal history of malignant neoplasm of breast: Secondary | ICD-10-CM | POA: Insufficient documentation

## 2022-07-24 DIAGNOSIS — K224 Dyskinesia of esophagus: Secondary | ICD-10-CM | POA: Insufficient documentation

## 2022-07-24 DIAGNOSIS — M810 Age-related osteoporosis without current pathological fracture: Secondary | ICD-10-CM | POA: Insufficient documentation

## 2022-07-24 DIAGNOSIS — K2100 Gastro-esophageal reflux disease with esophagitis, without bleeding: Secondary | ICD-10-CM | POA: Diagnosis not present

## 2022-07-24 DIAGNOSIS — E785 Hyperlipidemia, unspecified: Secondary | ICD-10-CM | POA: Insufficient documentation

## 2022-07-24 DIAGNOSIS — J45909 Unspecified asthma, uncomplicated: Secondary | ICD-10-CM | POA: Diagnosis not present

## 2022-07-24 DIAGNOSIS — I10 Essential (primary) hypertension: Secondary | ICD-10-CM | POA: Insufficient documentation

## 2022-07-24 DIAGNOSIS — Z9221 Personal history of antineoplastic chemotherapy: Secondary | ICD-10-CM | POA: Insufficient documentation

## 2022-07-24 DIAGNOSIS — F32A Depression, unspecified: Secondary | ICD-10-CM | POA: Diagnosis not present

## 2022-07-24 HISTORY — PX: ESOPHAGOGASTRODUODENOSCOPY: SHX5428

## 2022-07-24 SURGERY — EGD (ESOPHAGOGASTRODUODENOSCOPY)
Anesthesia: General

## 2022-07-24 MED ORDER — PROPOFOL 10 MG/ML IV BOLUS
INTRAVENOUS | Status: DC | PRN
  Administered 2022-07-24: 50 mg via INTRAVENOUS

## 2022-07-24 MED ORDER — LIDOCAINE HCL (CARDIAC) PF 100 MG/5ML IV SOSY
PREFILLED_SYRINGE | INTRAVENOUS | Status: DC | PRN
  Administered 2022-07-24: 100 mg via INTRAVENOUS

## 2022-07-24 MED ORDER — SODIUM CHLORIDE 0.9 % IV SOLN
INTRAVENOUS | Status: DC
  Administered 2022-07-24: 1000 mL via INTRAVENOUS

## 2022-07-24 MED ORDER — PROPOFOL 500 MG/50ML IV EMUL
INTRAVENOUS | Status: DC | PRN
  Administered 2022-07-24: 150 ug/kg/min via INTRAVENOUS

## 2022-07-24 NOTE — Op Note (Signed)
[facility] Gastroenterology Patient Name: Jenna Carlson Procedure Date: 07/24/2022 10:06 AM MRN: [id number] Account #: [id number] Date of Birth: [birth year] Admit Type: Outpatient Age: 72 Room: [facility] ENDO ROOM 2 Gender: Female Note Status: Finalized Instrument Name: Upper Endoscope [id number] Procedure:             Upper GI endoscopy Indications:           For therapy of esophageal stenosis Providers:             Rueben Bash, DO Referring MD:          Ocie Cornfield. Ouida Sills MD, MD (Referring MD) Medicines:             Monitored Anesthesia Care Complications:         No immediate complications. Estimated blood loss:                         Minimal. Procedure:             Pre-Anesthesia Assessment:                        - Prior to the procedure, a History and Physical was                         performed, and patient medications and allergies were                         reviewed. The patient is competent. The risks and                         benefits of the procedure and the sedation options and                         risks were discussed with the patient. All questions                         were answered and informed consent was obtained.                         Patient identification and proposed procedure were                         verified by the physician, the nurse, the anesthetist                         and the technician in the endoscopy suite. Mental                         Status Examination: alert and oriented. Airway                         Examination: normal oropharyngeal airway and neck                         mobility. Respiratory Examination: clear to                         auscultation. CV Examination: RRR, no murmurs, no S3  or S4. Prophylactic Antibiotics: The patient does not                         require prophylactic antibiotics. Prior                         Anticoagulants: The patient has taken Plavix                          (clopidogrel), last dose was 7 days prior to                         procedure. ASA Grade Assessment: III - A patient with                         severe systemic disease. After reviewing the risks and                         benefits, the patient was deemed in satisfactory                         condition to undergo the procedure. The anesthesia                         plan was to use monitored anesthesia care (MAC).                         Immediately prior to administration of medications,                         the patient was re-assessed for adequacy to receive                         sedatives. The heart rate, respiratory rate, oxygen                         saturations, blood pressure, adequacy of pulmonary                         ventilation, and response to care were monitored                         throughout the procedure. The physical status of the                         patient was re-assessed after the procedure.                        After obtaining informed consent, the endoscope was                         passed under direct vision. Throughout the procedure,                         the patient's blood pressure, pulse, and oxygen                         saturations were monitored continuously. The Endoscope  was introduced through the mouth, and advanced to the                         second part of duodenum. The upper GI endoscopy was                         accomplished without difficulty. The patient tolerated                         the procedure well. Findings:      The duodenal bulb, first portion of the duodenum and second portion of       the duodenum were normal. Estimated blood loss: none.      A 4 cm hiatal hernia was present. Estimated blood loss: none.      The exam of the stomach was otherwise normal.      The Z-line was regular. Estimated blood loss: none.      Esophagogastric landmarks were identified:  the gastroesophageal junction       was found at 35 cm from the incisors.      One benign-appearing, intrinsic severe (stenosis; an endoscope cannot       pass) stenosis was found. This stenosis measured 1 cm (inner diameter) x       less than one cm (in length). The stenosis was traversed after dilation.       A TTS dilator was passed through the scope. Dilation with a 03-30-11 mm       balloon dilator was performed to 10 mm and 11 mm. The dilation site was       examined following endoscope reinsertion and showed mild mucosal       disruption. Estimated blood loss was minimal.      Abnormal motility was noted in the esophagus. The cricopharyngeus was       normal. There is spasticity of the esophageal body. The distal       esophagus/lower esophageal sphincter is open. Tertiary peristaltic waves       are noted. Estimated blood loss: none.      The exam of the esophagus was otherwise normal. Impression:            - Normal duodenal bulb, first portion of the duodenum                         and second portion of the duodenum.                        - 4 cm hiatal hernia.                        - Z-line regular.                        - Esophagogastric landmarks identified.                        - Benign-appearing esophageal stenosis. Dilated.                        - Abnormal esophageal motility, consistent with                         presbyesophagus.                        -  No specimens collected. Recommendation:        - Patient has a contact number available for                         emergencies. The signs and symptoms of potential                         delayed complications were discussed with the patient.                         Return to normal activities tomorrow. Written                         discharge instructions were provided to the patient.                        - Discharge patient to home.                        - Soft diet today.                        -  Continue present medications.                        - Consider discontinuation of boniva as this may be                         contributing to stenosis                        - Resume Plavix (clopidogrel) at prior dose in 2 days.                         Refer to managing physician for further adjustment of                         therapy.                        - Repeat upper endoscopy PRN for retreatment.                        - Return to GI office as previously scheduled.                        - The findings and recommendations were discussed with                         the patient. Procedure Code(s):     --- Professional ---                        [phone], Esophagogastroduodenoscopy, flexible,                         transoral; with transendoscopic balloon dilation of                         esophagus (less than 30 mm diameter) Diagnosis Code(s):     --- Professional ---  K44.9, Diaphragmatic hernia without obstruction or                         gangrene                        K22.2, Esophageal obstruction                        K22.4, Dyskinesia of esophagus CPT copyright 2022 American [employer] Association. All rights reserved. The codes documented in this report are preliminary and upon coder review may  be revised to meet current compliance requirements. Attending Participation:      I personally performed the entire procedure. Volney American, DO Annamaria Helling DO, DO 07/24/2022 10:37:24 AM This report has been signed electronically. Number of Addenda: 0 Note Initiated On: 07/24/2022 10:06 AM Estimated Blood Loss:  Estimated blood loss was minimal.      [facility]

## 2022-07-24 NOTE — Interval H&P Note (Signed)
History and Physical Interval Note: Preprocedure H&P from 07/24/22  was reviewed and there was no interval change after seeing and examining the patient.  Written consent was obtained from the patient after discussion of risks, benefits, and alternatives. Patient has consented to proceed with Esophagogastroduodenoscopy with possible intervention   07/24/2022 10:14 AM  Sylva  has presented today for surgery, with the diagnosis of Dysphagia, unspecified type (R13.10).  The various methods of treatment have been discussed with the patient and family. After consideration of risks, benefits and other options for treatment, the patient has consented to  Procedure(s): ESOPHAGOGASTRODUODENOSCOPY (EGD) (N/A) as a surgical intervention.  The patient's history has been reviewed, patient examined, no change in status, stable for surgery.  I have reviewed the patient's chart and labs.  Questions were answered to the patient's satisfaction.     Annamaria Helling

## 2022-07-24 NOTE — Anesthesia Preprocedure Evaluation (Signed)
Anesthesia Evaluation  Patient identified by MRN, date of birth, ID band Patient awake    Reviewed: Allergy & Precautions, H&P , NPO status , Patient's Chart, lab work & pertinent test results, reviewed documented beta blocker date and time   Airway Mallampati: II   Neck ROM: full    Dental  (+) Poor Dentition   Pulmonary shortness of breath and with exertion, asthma    Pulmonary exam normal        Cardiovascular hypertension, On Medications negative cardio ROS Normal cardiovascular exam Rhythm:regular Rate:Normal     Neuro/Psych  PSYCHIATRIC DISORDERS  Depression    negative neurological ROS     GI/Hepatic Neg liver ROS, PUD,GERD  Medicated,,  Endo/Other  negative endocrine ROS    Renal/GU negative Renal ROS  negative genitourinary   Musculoskeletal   Abdominal   Peds  Hematology negative hematology ROS (+)   Anesthesia Other Findings Past Medical History: No date: Allergic rhinitis No date: Asthma 1990's: Breast cancer ([place])     Comment:  right breast No date: Cancer ([place])     Comment:  BREAST No date: Depression No date: Edema No date: GERD (gastroesophageal reflux disease) No date: Hyperlipidemia No date: Hypertension 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, postmenopausal No date: Personal history of chemotherapy Past Surgical History: 07/10/2022: COLONOSCOPY; N/A     Comment:  Procedure: COLONOSCOPY;  Surgeon: Annamaria Helling,              DO;  Location: [facility] ENDOSCOPY;  Service:               Gastroenterology;  Laterality: N/A; 02/16/2017: COLONOSCOPY WITH PROPOFOL; N/A     Comment:  Procedure: COLONOSCOPY WITH PROPOFOL;  Surgeon: Manya Silvas, MD;  Location: [facility] ENDOSCOPY;  Service:               Endoscopy;  Laterality: N/A; 07/10/2022: ESOPHAGOGASTRODUODENOSCOPY; N/A     Comment:  Procedure: ESOPHAGOGASTRODUODENOSCOPY (EGD);  Surgeon:               Annamaria Helling, DO;  Location:  [facility] ENDOSCOPY;                Service: Gastroenterology;  Laterality: N/A; 02/16/2017: ESOPHAGOGASTRODUODENOSCOPY (EGD) WITH PROPOFOL; N/A     Comment:  Procedure: ESOPHAGOGASTRODUODENOSCOPY (EGD) WITH               PROPOFOL;  Surgeon: Manya Silvas, MD;  Location:               [facility] ENDOSCOPY;  Service: Endoscopy;  Laterality: N/A; No date: MASTECTOMY No date: WRIST SURGERY; Left BMI    Body Mass Index: 32.18 kg/m     Reproductive/Obstetrics negative OB ROS                             Anesthesia Physical Anesthesia Plan  ASA: 3  Anesthesia Plan: General   Post-op Pain Management:    Induction:   PONV Risk Score and Plan:   Airway Management Planned:   Additional Equipment:   Intra-op Plan:   Post-operative Plan:   Informed Consent: I have reviewed the patients History and Physical, chart, labs and discussed the procedure including the risks, benefits and alternatives for the proposed anesthesia with the patient or authorized representative who has indicated his/her understanding and acceptance.     Dental Advisory Given  Plan Discussed with:  CRNA  Anesthesia Plan Comments:        Anesthesia Quick Evaluation

## 2022-07-24 NOTE — Transfer of Care (Signed)
Immediate Anesthesia Transfer of Care Note  Patient: Jenna Carlson  Procedure(s) Performed: ESOPHAGOGASTRODUODENOSCOPY (EGD)  Patient Location: PACU  Anesthesia Type:General  Level of Consciousness: drowsy  Airway & Oxygen Therapy: Patient Spontanous Breathing  Post-op Assessment: Report given to RN and Post -op Vital signs reviewed and stable  Post vital signs: Reviewed and stable  Last Vitals:  Vitals Value Taken Time  BP 119/57 07/24/22 1035  Temp    Pulse 65 07/24/22 1035  Resp 19 07/24/22 1035  SpO2 92% 07/24/22 1035  Vitals shown include unvalidated device data.  Last Pain:  Vitals:   07/24/22 0936  TempSrc: Temporal  PainSc: 0-No pain         Complications: No notable events documented.

## 2022-07-24 NOTE — Anesthesia Procedure Notes (Signed)
Date/Time: 07/24/2022 10:19 AM  Performed by: Demetrius Charity, CRNAPre-anesthesia Checklist: Patient identified, Emergency Drugs available, Suction available, Patient being monitored and Timeout performed Patient Re-evaluated:Patient Re-evaluated prior to induction Oxygen Delivery Method: Nasal cannula Induction Type: IV induction Placement Confirmation: CO2 detector and positive ETCO2

## 2022-07-25 ENCOUNTER — Encounter: Payer: Self-pay | Admitting: Gastroenterology

## 2022-07-31 NOTE — Anesthesia Postprocedure Evaluation (Signed)
Anesthesia Post Note  Patient: Jenna Carlson  Procedure(s) Performed: ESOPHAGOGASTRODUODENOSCOPY (EGD)  Patient location during evaluation: PACU Anesthesia Type: General Level of consciousness: awake and alert Pain management: pain level controlled Vital Signs Assessment: post-procedure vital signs reviewed and stable Respiratory status: spontaneous breathing, nonlabored ventilation, respiratory function stable and patient connected to nasal cannula oxygen Cardiovascular status: blood pressure returned to baseline and stable Postop Assessment: no apparent nausea or vomiting Anesthetic complications: no   No notable events documented.   Last Vitals:  Vitals:   07/24/22 1044 07/24/22 1054  BP: 129/67 (!) 154/74  Pulse: 63 64  Resp: 13 17  Temp:    SpO2: 96% 97%    Last Pain:  Vitals:   07/25/22 0739  TempSrc:   PainSc: 0-No pain                 Molli Barrows

## 2022-12-14 ENCOUNTER — Other Ambulatory Visit
Admission: RE | Admit: 2022-12-14 | Discharge: 2022-12-14 | Disposition: A | Discharge status: Home / Self Care | Payer: Medicare PPO | Source: Ambulatory Visit | Attending: Internal Medicine | Admitting: Internal Medicine

## 2022-12-14 DIAGNOSIS — R071 Chest pain on breathing: Secondary | ICD-10-CM | POA: Insufficient documentation

## 2022-12-14 LAB — TROPONIN I (HIGH SENSITIVITY): Troponin I (High Sensitivity): 5 ng/L (ref <18)

## 2022-12-14 LAB — D-DIMER, QUANTITATIVE: D-Dimer, Quant: 0.93 ug/mL-FEU — ABNORMAL HIGH (ref 0.00–0.50)

## 2022-12-15 ENCOUNTER — Other Ambulatory Visit: Payer: Self-pay

## 2022-12-15 ENCOUNTER — Emergency Department
Admission: EM | Admit: 2022-12-15 | Discharge: 2022-12-15 | Disposition: A | Discharge status: Home / Self Care | Payer: Medicare PPO | Attending: Emergency Medicine | Admitting: Emergency Medicine

## 2022-12-15 ENCOUNTER — Emergency Department: Payer: Medicare PPO

## 2022-12-15 DIAGNOSIS — I10 Essential (primary) hypertension: Secondary | ICD-10-CM | POA: Diagnosis not present

## 2022-12-15 DIAGNOSIS — R7989 Other specified abnormal findings of blood chemistry: Secondary | ICD-10-CM | POA: Diagnosis not present

## 2022-12-15 DIAGNOSIS — R079 Chest pain, unspecified: Secondary | ICD-10-CM | POA: Diagnosis present

## 2022-12-15 LAB — COMPREHENSIVE METABOLIC PANEL WITH GFR
ALT: 57 U/L — ABNORMAL HIGH (ref 0–44)
AST: 47 U/L — ABNORMAL HIGH (ref 15–41)
Albumin: 3.4 g/dL — ABNORMAL LOW (ref 3.5–5.0)
Alkaline Phosphatase: 66 U/L (ref 38–126)
Anion gap: 10 (ref 5–15)
BUN: 15 mg/dL (ref 8–23)
CO2: 21 mmol/L — ABNORMAL LOW (ref 22–32)
Calcium: 8.2 mg/dL — ABNORMAL LOW (ref 8.9–10.3)
Chloride: 103 mmol/L (ref 98–111)
Creatinine, Ser: 0.92 mg/dL (ref 0.44–1.00)
GFR, Estimated: >60 mL/min
Glucose, Bld: 118 mg/dL — ABNORMAL HIGH (ref 70–99)
Potassium: 3.4 mmol/L — ABNORMAL LOW (ref 3.5–5.1)
Sodium: 134 mmol/L — ABNORMAL LOW (ref 135–145)
Total Bilirubin: 1 mg/dL (ref 0.3–1.2)
Total Protein: 6.6 g/dL (ref 6.5–8.1)

## 2022-12-15 LAB — CBC WITH DIFFERENTIAL/PLATELET
Abs Immature Granulocytes: 0.1 K/uL — ABNORMAL HIGH (ref 0.00–0.07)
Basophils Absolute: 0.1 K/uL (ref 0.0–0.1)
Basophils Relative: 1 %
Eosinophils Absolute: 0.6 K/uL — ABNORMAL HIGH (ref 0.0–0.5)
Eosinophils Relative: 3 %
HCT: 35.6 % — ABNORMAL LOW (ref 36.0–46.0)
Hemoglobin: 11.3 g/dL — ABNORMAL LOW (ref 12.0–15.0)
Immature Granulocytes: 1 %
Lymphocytes Relative: 8 %
Lymphs Abs: 1.5 K/uL (ref 0.7–4.0)
MCH: 33.2 pg (ref 26.0–34.0)
MCHC: 31.7 g/dL (ref 30.0–36.0)
MCV: 104.7 fL — ABNORMAL HIGH (ref 80.0–100.0)
Monocytes Absolute: 1.4 K/uL — ABNORMAL HIGH (ref 0.1–1.0)
Monocytes Relative: 8 %
Neutro Abs: 14.5 K/uL — ABNORMAL HIGH (ref 1.7–7.7)
Neutrophils Relative %: 79 %
Platelets: 267 K/uL (ref 150–400)
RBC: 3.4 MIL/uL — ABNORMAL LOW (ref 3.87–5.11)
RDW: 11.8 % (ref 11.5–15.5)
WBC: 18.2 K/uL — ABNORMAL HIGH (ref 4.0–10.5)
nRBC: 0 % (ref 0.0–0.2)

## 2022-12-15 MED ORDER — IOHEXOL 350 MG/ML SOLN
75.0000 mL | Freq: Once | INTRAVENOUS | Status: AC | PRN
  Administered 2022-12-15: 75 mL via INTRAVENOUS

## 2022-12-15 MED ORDER — IOHEXOL 300 MG/ML  SOLN: 75.0000 mL | Freq: Once | INTRAMUSCULAR | Status: DC | PRN

## 2022-12-15 MED ORDER — SODIUM CHLORIDE 0.9 % IV BOLUS
1000.0000 mL | Freq: Once | INTRAVENOUS | Status: AC
  Administered 2022-12-15: 1000 mL via INTRAVENOUS

## 2022-12-15 MED ORDER — DIPHENHYDRAMINE HCL 25 MG PO CAPS
50.0000 mg | ORAL_CAPSULE | Freq: Once | ORAL | Status: AC
  Administered 2022-12-15: 50 mg via ORAL
  Filled 2022-12-15: qty 2

## 2022-12-15 MED ORDER — DIPHENHYDRAMINE HCL 50 MG/ML IJ SOLN: 50.0000 mg | Freq: Once | INTRAMUSCULAR | Status: DC

## 2022-12-15 MED ORDER — METHYLPREDNISOLONE SODIUM SUCC 40 MG IJ SOLR
40.0000 mg | Freq: Once | INTRAMUSCULAR | Status: AC
  Administered 2022-12-15: 40 mg via INTRAVENOUS
  Filled 2022-12-15: qty 1

## 2022-12-15 NOTE — ED Notes (Signed)
Call to CT who states that they are coming to get her now

## 2022-12-15 NOTE — Discharge Instructions (Signed)
Your lab tests and CT scan of the chest were okay today.  Your evaluation is reassuring.  Continue following up with your doctor for further monitoring of your symptoms.

## 2022-12-15 NOTE — ED Triage Notes (Signed)
Pt presents to ED with c/o of needing a CTA. Pt states she had a minor reaction after taking contrast before, pt states SOB after having CT before, no swelling noted. Pt state she had an elevated d-dimer. Pt denies CP at this time.

## 2022-12-15 NOTE — ED Notes (Signed)
Pt helped up to bathroom to void.  Able to ambulate independently

## 2022-12-15 NOTE — ED Notes (Signed)
Call to CT to confirm that they are aware that premedication of solumedrol was given 12:50 and Benadryl will be given at 15:50 with plan for CT at 14:50.

## 2022-12-15 NOTE — ED Provider Notes (Signed)
[facility] Provider Note    Event Date/Time   First MD Initiated Contact with Patient 12/15/22 1212     (approximate)   History   No chief complaint on file.   HPI  Jenna Carlson is a 72 y.o. female with history of hypertension, hyperlipidemia presenting to the emergency department for evaluation of chest pain.  Yesterday, patient noticed some pain in the center of her chest, nonradiating with associated shortness of breath.  Does feel slightly improved today.  She saw her primary care doctor yesterday who ordered a D-dimer that was elevated.  Having of this, CT of the chest was recommended.  Patient does report a history of shortness of breath with previously receiving contrast.  She attributes this with having to take deep breaths and hold them, denies any throat closing, hives.  Unsure if she has received contrast since then.     Physical Exam   Triage Vital Signs: ED Triage Vitals  Enc Vitals Group     BP 12/15/22 1210 (!) 81/44     Pulse Rate 12/15/22 1210 74     Resp 12/15/22 1210 18     Temp 12/15/22 1210 98.9 F (37.2 C)     Temp Source 12/15/22 1210 Oral     SpO2 12/15/22 1210 92 %     Weight --      Height --      Head Circumference --      Peak Flow --      Pain Score 12/15/22 1209 0     Pain Loc --      Pain Edu? --      Excl. in GC? --     Most recent vital signs: Vitals:   12/15/22 1300 12/15/22 1500  BP: (!) 123/59 117/75  Pulse: 64 71  Resp: 15 11  Temp:    SpO2: 98% 92%     General: Awake, interactive  CV:  Regular rate, good peripheral perfusion.  Resp:  Lungs clear, unlabored respirations.  Abd:  Soft, nondistended.  Neuro:  Symmetric facial movement, fluid speech   ED Results / Procedures / Treatments   Labs (all labs ordered are listed, but only abnormal results are displayed) Labs Reviewed  COMPREHENSIVE METABOLIC PANEL - Abnormal; Notable for the following components:      Result Value   Sodium 134  (*)    Potassium 3.4 (*)    CO2 21 (*)    Glucose, Bld 118 (*)    Calcium 8.2 (*)    Albumin 3.4 (*)    AST 47 (*)    ALT 57 (*)    All other components within normal limits  CBC WITH DIFFERENTIAL/PLATELET - Abnormal; Notable for the following components:   WBC 18.2 (*)    RBC 3.40 (*)    Hemoglobin 11.3 (*)    HCT 35.6 (*)    MCV 104.7 (*)    Neutro Abs 14.5 (*)    Monocytes Absolute 1.4 (*)    Eosinophils Absolute 0.6 (*)    Abs Immature Granulocytes 0.10 (*)    All other components within normal limits     EKG EKG independently reviewed interpreted by myself (ER attending) demonstrates:  EKG demonstrates normal sinus rhythm heart rate 75, PR 152, QRS 82, QTc 435, nonspecific ST changes noted  RADIOLOGY Imaging independently reviewed and interpreted by myself demonstrates:  CT of the chest pending  PROCEDURES:  Critical Care performed: No  Procedures   MEDICATIONS  ORDERED IN ED: Medications  methylPREDNISolone sodium succinate (SOLU-MEDROL) 40 mg/mL injection 40 mg (40 mg Intravenous Given 12/15/22 1250)  diphenhydrAMINE (BENADRYL) capsule 50 mg (50 mg Oral Given 12/15/22 1540)  sodium chloride 0.9 % bolus 1,000 mL (1,000 mLs Intravenous New Bag/Given 12/15/22 1512)     IMPRESSION / MDM / ASSESSMENT AND PLAN / ED COURSE  I reviewed the triage vital signs and the nursing notes.  Differential diagnosis includes, but is not limited to, PE, musculoskeletal pain, lower suspicion ACS with negative troponin as an outpatient, resolution of chest pain here  Patient's presentation is most consistent with acute presentation with potential threat to life or bodily function.  72 year old female presenting with chest pain and elevated D-dimer as an outpatient with contrast allergy.  Did review with CT tech.  While patient's clinical history is not suggestive of a true allergic reaction, given nature of reaction we will proceed with premedication with a 4-hour protocol with  Solu-Medrol and Benadryl.  This has been ordered.  Patient has been administered these medications.  Nurse and CT are aware of timing of scan.  Signed out to oncoming physician pending CTA, reevaluation, and disposition.      FINAL CLINICAL IMPRESSION(S) / ED DIAGNOSES   Final diagnoses:  Chest pain, unspecified type     Rx / DC Orders   ED Discharge Orders     None        Note:  This document was prepared using Dragon voice recognition software and may include unintentional dictation errors.   Trinna Post, MD 12/15/22 [phone]

## 2023-01-14 NOTE — H&P (Signed)
Pre-Procedure H&P   Patient ID: Jenna Carlson is a 72 y.o. female.  Gastroenterology Provider: [name], DO  Referring Provider: Tawni Pummel, PA PCP: Lauro Regulus, MD  Date: 01/15/2023  HPI Jenna Carlson is a 72 y.o. female who presents today for Colonoscopy for Personal history of colon polyps .  Patient reports occasional diarrhea but most days 1 bowel movement.  No melena or hematochezia.  Underwent colonoscopy in January 2024 demonstrating adenomatous polyp and hyperplastic polyps.  In the proximal ascending colon she had a 16 to 18 mm tubular adenoma that was removed with cold snare in piecemeal.  STIC was applied to the edges as well as 4 clips.  Left-sided diverticulosis was noted.  Normal terminal ileum and internal hemorrhoids also appreciated.  She did have 3 other tubular adenomas in the transverse colon.  Random biopsies were negative for microscopic colitis  Colonoscopy in August 2018 only demonstrated 1 tubular adenoma.  Hemoglobin 11.3 MCV 104.7 creatinine 0.92 platelets 267,000   Past Medical History:  Diagnosis Date   Allergic rhinitis    Asthma    Breast cancer (HCC) 1990's   right breast   Cancer (HCC)    BREAST   Depression    Edema    GERD (gastroesophageal reflux disease)    Hyperlipidemia    Hypertension    Osteoporosis, postmenopausal    Personal history of chemotherapy     Past Surgical History:  Procedure Laterality Date   COLONOSCOPY N/A 07/10/2022   Procedure: COLONOSCOPY;  Surgeon: [name], DO;  Location: [facility] ENDOSCOPY;  Service: Gastroenterology;  Laterality: N/A;   COLONOSCOPY WITH PROPOFOL N/A 02/16/2017   Procedure: COLONOSCOPY WITH PROPOFOL;  Surgeon: Scot Jun, MD;  Location: [facility] ENDOSCOPY;  Service: Endoscopy;  Laterality: N/A;   ESOPHAGOGASTRODUODENOSCOPY N/A 07/10/2022   Procedure: ESOPHAGOGASTRODUODENOSCOPY (EGD);  Surgeon: [name], DO;  Location: [facility] ENDOSCOPY;   Service: Gastroenterology;  Laterality: N/A;   ESOPHAGOGASTRODUODENOSCOPY N/A 07/24/2022   Procedure: ESOPHAGOGASTRODUODENOSCOPY (EGD);  Surgeon: [name], DO;  Location: [facility] ENDOSCOPY;  Service: Gastroenterology;  Laterality: N/A;   ESOPHAGOGASTRODUODENOSCOPY (EGD) WITH PROPOFOL N/A 02/16/2017   Procedure: ESOPHAGOGASTRODUODENOSCOPY (EGD) WITH PROPOFOL;  Surgeon: Scot Jun, MD;  Location: [facility] ENDOSCOPY;  Service: Endoscopy;  Laterality: N/A;   MASTECTOMY     WRIST SURGERY Left     Family History No h/o GI disease or malignancy  Review of Systems  Constitutional:  Negative for activity change, appetite change, chills, diaphoresis, fatigue, fever and unexpected weight change.  HENT:  Negative for trouble swallowing and voice change.   Respiratory:  Negative for shortness of breath and wheezing.   Cardiovascular:  Negative for chest pain, palpitations and leg swelling.  Gastrointestinal:  Positive for diarrhea. Negative for abdominal distention, abdominal pain, anal bleeding, blood in stool, constipation, nausea, rectal pain and vomiting.  Musculoskeletal:  Negative for arthralgias and myalgias.  Skin:  Negative for color change and pallor.  Neurological:  Negative for dizziness, syncope and weakness.  Psychiatric/Behavioral:  Negative for confusion.   All other systems reviewed and are negative.    Medications No current facility-administered medications on file prior to encounter.   Current Outpatient Medications on File Prior to Encounter  Medication Sig Dispense Refill   carvedilol (COREG) 25 MG tablet Take 25 mg by mouth 2 (two) times daily with a meal.     ondansetron (ZOFRAN ODT) 4 MG disintegrating tablet Take 1 tablet (4 mg total) by mouth every  8 (eight) hours as needed for nausea or vomiting. 20 tablet 0   ondansetron (ZOFRAN ODT) 4 MG disintegrating tablet Take 1 tablet (4 mg total) by mouth every 8 (eight) hours as needed for nausea or vomiting. 20  tablet 0   potassium chloride 20 MEQ TBCR Take 20 mEq by mouth 2 (two) times daily. 30 tablet 0   sertraline (ZOLOFT) 50 MG tablet Take 50 mg by mouth daily.     simvastatin (ZOCOR) 20 MG tablet Take by mouth daily.      verapamil (CALAN-SR) 240 MG CR tablet Take 1 tablet (240 mg total) by mouth daily. 30 tablet 0   fluticasone furoate-vilanterol (BREO ELLIPTA) 100-25 MCG/INH AEPB Inhale 1 puff into the lungs daily.     montelukast (SINGULAIR) 10 MG tablet Take 10 mg by mouth at bedtime.     omeprazole (PRILOSEC) 40 MG capsule Take 40 mg by mouth 2 (two) times daily.      Pertinent medications related to GI and procedure were reviewed by me with the patient prior to the procedure   Current Facility-Administered Medications:    0.9 %  sodium chloride infusion, , Intravenous, Continuous, [name], DO, Last Rate: 20 mL/hr at 01/15/23 0802, Continued from Pre-op at 01/15/23 0802  sodium chloride 20 mL/hr at 01/15/23 0802       Allergies  Allergen Reactions   Iodinated Contrast Media Shortness Of Breath   Aspirin Tinitus and Other (See Comments)    Tinnitus   Codeine    Egg-Derived Products Other (See Comments)    On testing   Hydrochlorothiazide Other (See Comments)   Morphine And Codeine Nausea And Vomiting   Allergies were reviewed by me prior to the procedure  Objective   Body mass index is 30.69 kg/m. Vitals:   01/15/23 0743  BP: 113/75  Pulse: 67  Resp: 20  Temp: (!) 96.5 F (35.8 C)  TempSrc: Temporal  SpO2: 96%  Weight: 83.6 kg  Height: 5\' 5"  (1.651 m)     Physical Exam Vitals and nursing note reviewed.  Constitutional:      General: She is not in acute distress.    Appearance: Normal appearance. She is obese. She is not ill-appearing, toxic-appearing or diaphoretic.  HENT:     Head: Normocephalic and atraumatic.     Nose: Nose normal.     Mouth/Throat:     Mouth: Mucous membranes are moist.     Pharynx: Oropharynx is clear.  Eyes:      General: No scleral icterus.    Extraocular Movements: Extraocular movements intact.  Cardiovascular:     Rate and Rhythm: Normal rate and regular rhythm.     Heart sounds: Normal heart sounds. No murmur heard.    No friction rub. No gallop.  Pulmonary:     Effort: Pulmonary effort is normal. No respiratory distress.     Breath sounds: Normal breath sounds. No wheezing, rhonchi or rales.  Abdominal:     General: Bowel sounds are normal. There is no distension.     Palpations: Abdomen is soft.     Tenderness: There is no abdominal tenderness. There is no guarding or rebound.  Musculoskeletal:     Cervical back: Neck supple.     Right lower leg: No edema.     Left lower leg: No edema.  Skin:    General: Skin is warm and dry.     Coloration: Skin is not jaundiced or pale.  Neurological:  General: No focal deficit present.     Mental Status: She is alert and oriented to person, place, and time. Mental status is at baseline.  Psychiatric:        Mood and Affect: Mood normal.        Behavior: Behavior normal.        Thought Content: Thought content normal.        Judgment: Judgment normal.      Assessment:  Jenna Carlson is a 72 y.o. female  who presents today for Colonoscopy for Personal history of colon polyps .  Plan:  Colonoscopy with possible intervention today  Colonoscopy with possible biopsy, control of bleeding, polypectomy, and interventions as necessary has been discussed with the patient/patient representative. Informed consent was obtained from the patient/patient representative after explaining the indication, nature, and risks of the procedure including but not limited to death, bleeding, perforation, missed neoplasm/lesions, cardiorespiratory compromise, and reaction to medications. Opportunity for questions was given and appropriate answers were provided. Patient/patient representative has verbalized understanding is amenable to undergoing the  procedure.   [name], DO  [facility] Gastroenterology  Portions of the record may have been created with voice recognition software. Occasional wrong-word or 'sound-a-like' substitutions may have occurred due to the inherent limitations of voice recognition software.  Read the chart carefully and recognize, using context, where substitutions may have occurred.

## 2023-01-15 ENCOUNTER — Ambulatory Visit
Admission: RE | Admit: 2023-01-15 | Discharge: 2023-01-15 | Disposition: A | Discharge status: Home / Self Care | Payer: Medicare PPO | Attending: Gastroenterology | Admitting: Gastroenterology

## 2023-01-15 ENCOUNTER — Encounter: Payer: Self-pay | Admitting: Gastroenterology

## 2023-01-15 ENCOUNTER — Ambulatory Visit: Payer: Medicare PPO | Admitting: Certified Registered"

## 2023-01-15 ENCOUNTER — Encounter
Admission: RE | Disposition: A | Discharge status: Home / Self Care | Payer: Self-pay | Source: Home / Self Care | Attending: Gastroenterology

## 2023-01-15 DIAGNOSIS — D122 Benign neoplasm of ascending colon: Secondary | ICD-10-CM | POA: Insufficient documentation

## 2023-01-15 DIAGNOSIS — K641 Second degree hemorrhoids: Secondary | ICD-10-CM | POA: Insufficient documentation

## 2023-01-15 DIAGNOSIS — J45909 Unspecified asthma, uncomplicated: Secondary | ICD-10-CM | POA: Insufficient documentation

## 2023-01-15 DIAGNOSIS — Z09 Encounter for follow-up examination after completed treatment for conditions other than malignant neoplasm: Secondary | ICD-10-CM | POA: Insufficient documentation

## 2023-01-15 DIAGNOSIS — I10 Essential (primary) hypertension: Secondary | ICD-10-CM | POA: Insufficient documentation

## 2023-01-15 DIAGNOSIS — D124 Benign neoplasm of descending colon: Secondary | ICD-10-CM | POA: Insufficient documentation

## 2023-01-15 DIAGNOSIS — Z853 Personal history of malignant neoplasm of breast: Secondary | ICD-10-CM | POA: Diagnosis not present

## 2023-01-15 DIAGNOSIS — K5730 Diverticulosis of large intestine without perforation or abscess without bleeding: Secondary | ICD-10-CM | POA: Insufficient documentation

## 2023-01-15 DIAGNOSIS — Z86010 Personal history of colonic polyps: Secondary | ICD-10-CM | POA: Diagnosis not present

## 2023-01-15 DIAGNOSIS — D1779 Benign lipomatous neoplasm of other sites: Secondary | ICD-10-CM | POA: Diagnosis not present

## 2023-01-15 DIAGNOSIS — E785 Hyperlipidemia, unspecified: Secondary | ICD-10-CM | POA: Diagnosis not present

## 2023-01-15 DIAGNOSIS — D125 Benign neoplasm of sigmoid colon: Secondary | ICD-10-CM | POA: Diagnosis not present

## 2023-01-15 DIAGNOSIS — Z7951 Long term (current) use of inhaled steroids: Secondary | ICD-10-CM | POA: Diagnosis not present

## 2023-01-15 DIAGNOSIS — K219 Gastro-esophageal reflux disease without esophagitis: Secondary | ICD-10-CM | POA: Diagnosis not present

## 2023-01-15 DIAGNOSIS — K621 Rectal polyp: Secondary | ICD-10-CM | POA: Insufficient documentation

## 2023-01-15 DIAGNOSIS — K624 Stenosis of anus and rectum: Secondary | ICD-10-CM | POA: Diagnosis not present

## 2023-01-15 DIAGNOSIS — Z6830 Body mass index (BMI) 30.0-30.9, adult: Secondary | ICD-10-CM | POA: Diagnosis not present

## 2023-01-15 DIAGNOSIS — Z9221 Personal history of antineoplastic chemotherapy: Secondary | ICD-10-CM | POA: Insufficient documentation

## 2023-01-15 HISTORY — PX: HEMOSTASIS CLIP PLACEMENT: SHX6857

## 2023-01-15 HISTORY — PX: SUBMUCOSAL LIFTING INJECTION: SHX6855

## 2023-01-15 HISTORY — PX: POLYPECTOMY: SHX5525

## 2023-01-15 HISTORY — PX: COLONOSCOPY: SHX5424

## 2023-01-15 SURGERY — COLONOSCOPY
Anesthesia: General

## 2023-01-15 MED ORDER — PROPOFOL 500 MG/50ML IV EMUL
INTRAVENOUS | Status: DC | PRN
  Administered 2023-01-15: 150 ug/kg/min via INTRAVENOUS

## 2023-01-15 MED ORDER — EPHEDRINE SULFATE (PRESSORS) 50 MG/ML IJ SOLN
INTRAMUSCULAR | Status: DC | PRN
  Administered 2023-01-15: 5 mg via INTRAVENOUS

## 2023-01-15 MED ORDER — PROPOFOL 1000 MG/100ML IV EMUL
INTRAVENOUS | Status: AC
  Filled 2023-01-15: qty 100

## 2023-01-15 MED ORDER — PROPOFOL 10 MG/ML IV BOLUS
INTRAVENOUS | Status: DC | PRN
  Administered 2023-01-15: 60 mg via INTRAVENOUS

## 2023-01-15 MED ORDER — SODIUM CHLORIDE 0.9 % IV SOLN
INTRAVENOUS | Status: DC
  Administered 2023-01-15: 20 mL/h via INTRAVENOUS

## 2023-01-15 MED ORDER — ONDANSETRON HCL 4 MG/2ML IJ SOLN
INTRAMUSCULAR | Status: DC | PRN
  Administered 2023-01-15: 4 mg via INTRAVENOUS

## 2023-01-15 MED ORDER — LIDOCAINE HCL (CARDIAC) PF 100 MG/5ML IV SOSY
PREFILLED_SYRINGE | INTRAVENOUS | Status: DC | PRN
  Administered 2023-01-15: 50 mg via INTRAVENOUS

## 2023-01-15 NOTE — Interval H&P Note (Signed)
History and Physical Interval Note: Preprocedure H&P from 01/15/23  was reviewed and there was no interval change after seeing and examining the patient.  Written consent was obtained from the patient after discussion of risks, benefits, and alternatives. Patient has consented to proceed with Colonoscopy with possible intervention   01/15/2023 8:07 AM  Jenna Carlson  has presented today for surgery, with the diagnosis of Personal history of colonic polyps (Z86.010).  The various methods of treatment have been discussed with the patient and family. After consideration of risks, benefits and other options for treatment, the patient has consented to  Procedure(s): COLONOSCOPY (N/A) as a surgical intervention.  The patient's history has been reviewed, patient examined, no change in status, stable for surgery.  I have reviewed the patient's chart and labs.  Questions were answered to the patient's satisfaction.     [name]

## 2023-01-15 NOTE — Op Note (Signed)
[facility] Gastroenterology Patient Name: Jenna Carlson Procedure Date: 01/15/2023 8:09 AM MRN: [id number] Account #: [id number] Date of Birth: 14-May-1951 Admit Type: Outpatient Age: 72 Room: [facility] ENDO ROOM 2 Gender: Female Note Status: Finalized Instrument Name: Prentice Docker [id number] Procedure:             Colonoscopy Indications:           High risk colon cancer surveillance: Personal history                         of colonic polyps, Surveillance: Personal history of                         piecemeal removal of adenoma on last colonoscopy 6                         months ago Providers:             Trenda Moots, DO Referring MD:          Marya Amsler. Dareen Piano MD, MD (Referring MD) Medicines:             Monitored Anesthesia Care Complications:         No immediate complications. Estimated blood loss:                         Minimal. Procedure:             Pre-Anesthesia Assessment:                        - Prior to the procedure, a History and Physical was                         performed, and patient medications and allergies were                         reviewed. The patient is competent. The risks and                         benefits of the procedure and the sedation options and                         risks were discussed with the patient. All questions                         were answered and informed consent was obtained.                         Patient identification and proposed procedure were                         verified by the physician, the nurse, the anesthetist                         and the technician in the endoscopy suite. Mental                         Status Examination: alert and oriented. Airway  Examination: normal oropharyngeal airway and neck                         mobility. Respiratory Examination: clear to                         auscultation. CV Examination: RRR, no murmurs, no S3                          or S4. Prophylactic Antibiotics: The patient does not                         require prophylactic antibiotics. Prior                         Anticoagulants: The patient has taken Plavix                         (clopidogrel), last dose was 5 days prior to                         procedure. ASA Grade Assessment: III - A patient with                         severe systemic disease. After reviewing the risks and                         benefits, the patient was deemed in satisfactory                         condition to undergo the procedure. The anesthesia                         plan was to use monitored anesthesia care (MAC).                         Immediately prior to administration of medications,                         the patient was re-assessed for adequacy to receive                         sedatives. The heart rate, respiratory rate, oxygen                         saturations, blood pressure, adequacy of pulmonary                         ventilation, and response to care were monitored                         throughout the procedure. The physical status of the                         patient was re-assessed after the procedure.                        After obtaining informed consent, the colonoscope was  passed under direct vision. Throughout the procedure,                         the patient's blood pressure, pulse, and oxygen                         saturations were monitored continuously. The                         Colonoscope was introduced through the anus and                         advanced to the the cecum, identified by appendiceal                         orifice and ileocecal valve. The colonoscopy was                         somewhat difficult due to multiple diverticula in the                         colon and restricted mobility of the colon. Successful                         completion of the procedure was aided by applying                          abdominal pressure and lavage. The patient tolerated                         the procedure well. The quality of the bowel                         preparation was evaluated using the BBPS [facility] Bowel                         Preparation Scale) with scores of: Right Colon = 3                         (entire mucosa seen well with no residual staining,                         small fragments of stool or opaque liquid), Transverse                         Colon = 3 (entire mucosa seen well with no residual                         staining, small fragments of stool or opaque liquid)                         and Left Colon = 2 (minor amount of residual staining,                         small fragments of stool and/or opaque liquid, but  mucosa seen well). The total BBPS score equals 8. The                         quality of the bowel preparation was excellent. The                         ileocecal valve, appendiceal orifice, and rectum were                         photographed. Findings:      The perianal and digital rectal examinations were normal. Pertinent       negatives include normal sphincter tone.      Five sessile polyps were found in the rectum (2) and descending colon       (3). The polyps were 1 to 2 mm in size. These polyps were removed with a       jumbo cold forceps. Resection and retrieval were complete. Estimated       blood loss was minimal.      A 3 to 4 mm polyp was found in the sigmoid colon. The polyp was sessile.       The polyp was removed with a cold snare. Resection and retrieval were       complete. Estimated blood loss was minimal.      A post polypectomy scar was found in the proximal ascending colon. There       was residual polyp tissue.      A 7 to 8 mm polyp was found in the proximal ascending colon. The polyp       was sessile. Area was successfully injected with 4 mL EverLift for       lesion assessment, and this injection  appeared to lift the lesion       adequately. Margins assessed with NBI for EMR. Polypectomy was       attempted, initially using a hot snare. Polyp resection was incomplete       with this device. This intervention then required a different device and       polypectomy technique. The polyp was removed with a cold snare.       Resection and retrieval were complete. Estimated blood loss was minimal.       To prevent bleeding after the polypectomy, two hemostatic clips were       successfully placed (MR conditional). There was no bleeding at the end       of the procedure. Cold snare was used to remove residual polyp from hot       snare as the center could not be captured adequately with hot snare.       Cold snare was able to remove the rest of the polypoid tissue.      Non-bleeding internal hemorrhoids were found during retroflexion. The       hemorrhoids were Grade II (internal hemorrhoids that prolapse but reduce       spontaneously). Estimated blood loss: none.      Multiple medium-mouthed, severe diverticula were found in the left       colon. Estimated blood loss: none.      A benign-appearing, intrinsic moderate stenosis measuring 2 cm (in       length) x 1.2 cm (inner diameter) was found at 20 cm proximal to the       anus and was traversed. Estimated blood loss:  none.      There was a small lipoma, in the distal ascending colon. Estimated blood       loss: none.      The exam was otherwise without abnormality on direct and retroflexion       views. Impression:            - Five 1 to 2 mm polyps in the rectum and in the                         descending colon, removed with a jumbo cold forceps.                         Resected and retrieved.                        - One 3 to 4 mm polyp in the sigmoid colon, removed                         with a cold snare. Resected and retrieved.                        - Post-polypectomy scar in the proximal ascending                          colon.                        - One 7 to 8 mm polyp in the proximal ascending colon,                         removed with a cold snare. Resected and retrieved.                         Injected. Clips (MR conditional) were placed.                        - Non-bleeding internal hemorrhoids.                        - Diverticulosis in the left colon.                        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                        - Small lipoma in the distal ascending colon.                        - The examination was otherwise normal on direct and                         retroflexion views. Recommendation:        - Patient has a contact number available for                         emergencies. The signs and symptoms of potential                         delayed  complications were discussed with the patient.                         Return to normal activities tomorrow. Written                         discharge instructions were provided to the patient.                        - Discharge patient to home.                        - Resume previous diet.                        - Continue present medications.                        - No ibuprofen, naproxen, or other non-steroidal                         anti-inflammatory drugs for 5 days after polyp removal.                        - Resume Plavix (clopidogrel) at prior dose in 2 days.                         Refer to managing physician for further adjustment of                         therapy.                        - Await pathology results.                        - Repeat colonoscopy for surveillance based on                         pathology results.                        - Return to GI office as previously scheduled.                        - The findings and recommendations were discussed with                         the patient. Procedure Code(s):     --- Professional ---                        [phone], Colonoscopy, flexible; with  removal of                         tumor(s), polyp(s), or other lesion(s) by snare                         technique                        45380, 59, Colonoscopy, flexible; with biopsy, single  or multiple                        45381, Colonoscopy, flexible; with directed submucosal                         injection(s), any substance Diagnosis Code(s):     --- Professional ---                        Z86.010, Personal history of colonic polyps                        D12.8, Benign neoplasm of rectum                        D12.4, Benign neoplasm of descending colon                        D12.5, Benign neoplasm of sigmoid colon                        D12.2, Benign neoplasm of ascending colon                        Z98.890, Other specified postprocedural states                        K64.1, Second degree hemorrhoids                        K56.699, Other intestinal obstruction unspecified as                         to partial versus complete obstruction                        Z09, Encounter for follow-up examination after                         completed treatment for conditions other than                         malignant neoplasm                        K57.30, Diverticulosis of large intestine without                         perforation or abscess without bleeding CPT copyright 2022 [employer]. All rights reserved. The codes documented in this report are preliminary and upon coder review may  be revised to meet current compliance requirements. Attending Participation:      I personally performed the entire procedure. Elfredia Nevins, DO [name] DO, DO 01/15/2023 9:07:25 AM This report has been signed electronically. Number of Addenda: 0 Note Initiated On: 01/15/2023 8:09 AM Scope Withdrawal Time: 0 hours 15 minutes 35 seconds  Total Procedure Duration: 0 hours 38 minutes 55 seconds  Estimated Blood Loss:  Estimated blood loss was  minimal.      [facility]

## 2023-01-15 NOTE — Anesthesia Preprocedure Evaluation (Signed)
Anesthesia Evaluation  Patient identified by MRN, date of birth, ID band Patient awake    Reviewed: Allergy & Precautions, NPO status , Patient's Chart, lab work & pertinent test results  Airway Mallampati: III  TM Distance: >3 FB Neck ROM: full    Dental  (+) Teeth Intact   Pulmonary neg pulmonary ROS, asthma    Pulmonary exam normal breath sounds clear to auscultation       Cardiovascular Exercise Tolerance: Good hypertension, Pt. on medications negative cardio ROS Normal cardiovascular exam Rhythm:Regular Rate:Normal     Neuro/Psych  PSYCHIATRIC DISORDERS      TIAnegative neurological ROS  negative psych ROS   GI/Hepatic negative GI ROS, Neg liver ROS, PUD,GERD  Medicated,,  Endo/Other  negative endocrine ROS  Morbid obesity  Renal/GU negative Renal ROS  negative genitourinary   Musculoskeletal   Abdominal  (+) + obese  Peds negative pediatric ROS (+)  Hematology negative hematology ROS (+)   Anesthesia Other Findings Past Medical History: No date: Allergic rhinitis No date: Asthma 1990's: Breast cancer (HCC)     Comment:  right breast No date: Cancer (HCC)     Comment:  BREAST No date: Depression No date: Edema No date: GERD (gastroesophageal reflux disease) No date: Hyperlipidemia No date: Hypertension 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, postmenopausal No date: Personal history of chemotherapy  Past Surgical History: 07/10/2022: COLONOSCOPY; N/A     Comment:  Procedure: COLONOSCOPY;  Surgeon: [name],              DO;  Location: [facility] ENDOSCOPY;  Service:               Gastroenterology;  Laterality: N/A; 02/16/2017: COLONOSCOPY WITH PROPOFOL; N/A     Comment:  Procedure: COLONOSCOPY WITH PROPOFOL;  Surgeon: Scot Jun, MD;  Location: [facility] ENDOSCOPY;  Service:               Endoscopy;  Laterality: N/A; 07/10/2022: ESOPHAGOGASTRODUODENOSCOPY; N/A     Comment:  Procedure:  ESOPHAGOGASTRODUODENOSCOPY (EGD);  Surgeon:               [name], DO;  Location: [facility] ENDOSCOPY;                Service: Gastroenterology;  Laterality: N/A; 07/24/2022: ESOPHAGOGASTRODUODENOSCOPY; N/A     Comment:  Procedure: ESOPHAGOGASTRODUODENOSCOPY (EGD);  Surgeon:               [name], DO;  Location: [facility] ENDOSCOPY;                Service: Gastroenterology;  Laterality: N/A; 02/16/2017: ESOPHAGOGASTRODUODENOSCOPY (EGD) WITH PROPOFOL; N/A     Comment:  Procedure: ESOPHAGOGASTRODUODENOSCOPY (EGD) WITH               PROPOFOL;  Surgeon: Scot Jun, MD;  Location:               [facility] ENDOSCOPY;  Service: Endoscopy;  Laterality: N/A; No date: MASTECTOMY No date: WRIST SURGERY; Left  BMI    Body Mass Index: 30.69 kg/m      Reproductive/Obstetrics negative OB ROS                             Anesthesia Physical Anesthesia Plan  ASA: 3  Anesthesia Plan: General   Post-op Pain Management:    Induction: Intravenous  PONV Risk Score and  Plan: Propofol infusion and TIVA  Airway Management Planned: Natural Airway  Additional Equipment:   Intra-op Plan:   Post-operative Plan:   Informed Consent: I have reviewed the patients History and Physical, chart, labs and discussed the procedure including the risks, benefits and alternatives for the proposed anesthesia with the patient or authorized representative who has indicated his/her understanding and acceptance.     Dental Advisory Given  Plan Discussed with: CRNA and Surgeon  Anesthesia Plan Comments:        Anesthesia Quick Evaluation

## 2023-01-15 NOTE — Transfer of Care (Signed)
Immediate Anesthesia Transfer of Care Note  Patient: Jenna Carlson  Procedure(s) Performed: COLONOSCOPY POLYPECTOMY HEMOSTASIS CLIP PLACEMENT SUBMUCOSAL LIFTING INJECTION  Patient Location: Endoscopy Unit  Anesthesia Type:General  Level of Consciousness: awake, alert , and oriented  Airway & Oxygen Therapy: Patient Spontanous Breathing  Post-op Assessment: Report given to RN and Post -op Vital signs reviewed and stable  Post vital signs: Reviewed and stable  Last Vitals:  Vitals Value Taken Time  BP 123/58 01/15/23 0900  Temp    Pulse 67 01/15/23 0900  Resp 20 01/15/23 0900  SpO2 90 % 01/15/23 0900    Last Pain:  Vitals:   01/15/23 0900  TempSrc:   PainSc: 0-No pain         Complications: No notable events documented.

## 2023-01-15 NOTE — Anesthesia Procedure Notes (Signed)
Procedure Name: MAC Date/Time: 01/15/2023 8:11 AM  Performed by: Cheral Bay, CRNAPre-anesthesia Checklist: Patient identified, Emergency Drugs available, Suction available, Patient being monitored and Timeout performed Patient Re-evaluated:Patient Re-evaluated prior to induction Oxygen Delivery Method: Nasal cannula Induction Type: IV induction Placement Confirmation: positive ETCO2 and CO2 detector

## 2023-01-15 NOTE — Anesthesia Postprocedure Evaluation (Signed)
Anesthesia Post Note  Patient: Jenna Carlson  Procedure(s) Performed: COLONOSCOPY POLYPECTOMY HEMOSTASIS CLIP PLACEMENT SUBMUCOSAL LIFTING INJECTION  Patient location during evaluation: PACU Anesthesia Type: General Level of consciousness: awake and awake and alert Pain management: satisfactory to patient Vital Signs Assessment: post-procedure vital signs reviewed and stable Respiratory status: nonlabored ventilation Cardiovascular status: blood pressure returned to baseline Anesthetic complications: no   No notable events documented.   Last Vitals:  Vitals:   01/15/23 0901 01/15/23 0910  BP:  127/69  Pulse:  66  Resp:  16  Temp:    SpO2: 92% 93%    Last Pain:  Vitals:   01/15/23 0910  TempSrc:   PainSc: 0-No pain                 [name],[name]

## 2023-01-16 ENCOUNTER — Encounter: Payer: Self-pay | Admitting: Gastroenterology

## 2023-01-29 ENCOUNTER — Other Ambulatory Visit: Payer: Self-pay | Admitting: Internal Medicine

## 2023-01-29 DIAGNOSIS — Z1231 Encounter for screening mammogram for malignant neoplasm of breast: Secondary | ICD-10-CM

## 2023-03-01 ENCOUNTER — Ambulatory Visit
Admission: RE | Admit: 2023-03-01 | Discharge: 2023-03-01 | Disposition: A | Discharge status: Home / Self Care | Payer: Medicare PPO | Source: Ambulatory Visit | Attending: Internal Medicine | Admitting: Internal Medicine

## 2023-03-01 DIAGNOSIS — Z1231 Encounter for screening mammogram for malignant neoplasm of breast: Secondary | ICD-10-CM | POA: Insufficient documentation

## 2023-11-15 ENCOUNTER — Other Ambulatory Visit: Payer: Self-pay

## 2023-11-15 ENCOUNTER — Observation Stay
Admission: EM | Admit: 2023-11-15 | Discharge: 2023-11-17 | Disposition: A | Discharge status: Home Health | Attending: Emergency Medicine | Admitting: Emergency Medicine

## 2023-11-15 DIAGNOSIS — I10 Essential (primary) hypertension: Secondary | ICD-10-CM | POA: Diagnosis not present

## 2023-11-15 DIAGNOSIS — R809 Proteinuria, unspecified: Secondary | ICD-10-CM | POA: Diagnosis not present

## 2023-11-15 DIAGNOSIS — K219 Gastro-esophageal reflux disease without esophagitis: Secondary | ICD-10-CM | POA: Insufficient documentation

## 2023-11-15 DIAGNOSIS — E876 Hypokalemia: Secondary | ICD-10-CM

## 2023-11-15 DIAGNOSIS — N179 Acute kidney failure, unspecified: Secondary | ICD-10-CM

## 2023-11-15 DIAGNOSIS — Z79899 Other long term (current) drug therapy: Secondary | ICD-10-CM | POA: Diagnosis not present

## 2023-11-15 DIAGNOSIS — J45909 Unspecified asthma, uncomplicated: Secondary | ICD-10-CM | POA: Insufficient documentation

## 2023-11-15 DIAGNOSIS — F32A Depression, unspecified: Secondary | ICD-10-CM | POA: Insufficient documentation

## 2023-11-15 DIAGNOSIS — R531 Weakness: Principal | ICD-10-CM

## 2023-11-15 DIAGNOSIS — E663 Overweight: Secondary | ICD-10-CM | POA: Insufficient documentation

## 2023-11-15 DIAGNOSIS — N390 Urinary tract infection, site not specified: Secondary | ICD-10-CM | POA: Diagnosis not present

## 2023-11-15 DIAGNOSIS — Z6827 Body mass index (BMI) 27.0-27.9, adult: Secondary | ICD-10-CM | POA: Diagnosis not present

## 2023-11-15 DIAGNOSIS — J45998 Other asthma: Secondary | ICD-10-CM | POA: Insufficient documentation

## 2023-11-15 DIAGNOSIS — R770 Abnormality of albumin: Secondary | ICD-10-CM | POA: Insufficient documentation

## 2023-11-15 DIAGNOSIS — Z853 Personal history of malignant neoplasm of breast: Secondary | ICD-10-CM | POA: Diagnosis not present

## 2023-11-15 DIAGNOSIS — Z7902 Long term (current) use of antithrombotics/antiplatelets: Secondary | ICD-10-CM | POA: Diagnosis not present

## 2023-11-15 DIAGNOSIS — J4520 Mild intermittent asthma, uncomplicated: Secondary | ICD-10-CM

## 2023-11-15 DIAGNOSIS — E860 Dehydration: Secondary | ICD-10-CM

## 2023-11-15 LAB — CBC
HCT: 39.7 % (ref 36.0–46.0)
Hemoglobin: 13.1 g/dL (ref 12.0–15.0)
MCH: 32.9 pg (ref 26.0–34.0)
MCHC: 33 g/dL (ref 30.0–36.0)
MCV: 99.7 fL (ref 80.0–100.0)
Platelets: 369 10*3/uL (ref 150–400)
RBC: 3.98 MIL/uL (ref 3.87–5.11)
RDW: 12.8 % (ref 11.5–15.5)
WBC: 11.9 10*3/uL — ABNORMAL HIGH (ref 4.0–10.5)
nRBC: 0 % (ref 0.0–0.2)

## 2023-11-15 LAB — URINALYSIS, ROUTINE W REFLEX MICROSCOPIC
Bacteria, UA: NONE SEEN
Glucose, UA: NEGATIVE mg/dL
Hgb urine dipstick: NEGATIVE
Ketones, ur: 20 mg/dL — AB
Nitrite: NEGATIVE
Protein, ur: >=300 mg/dL — AB
Specific Gravity, Urine: 1.031 — ABNORMAL HIGH (ref 1.005–1.030)
pH: 5 (ref 5.0–8.0)

## 2023-11-15 LAB — COMPREHENSIVE METABOLIC PANEL WITH GFR
ALT: 16 U/L (ref 0–44)
AST: 30 U/L (ref 15–41)
Albumin: 3.4 g/dL — ABNORMAL LOW (ref 3.5–5.0)
Alkaline Phosphatase: 41 U/L (ref 38–126)
Anion gap: 14 (ref 5–15)
BUN: 14 mg/dL (ref 8–23)
CO2: 15 mmol/L — ABNORMAL LOW (ref 22–32)
Calcium: 8.7 mg/dL — ABNORMAL LOW (ref 8.9–10.3)
Chloride: 107 mmol/L (ref 98–111)
Creatinine, Ser: 1.22 mg/dL — ABNORMAL HIGH (ref 0.44–1.00)
GFR, Estimated: 47 mL/min — ABNORMAL LOW (ref >60)
Glucose, Bld: 99 mg/dL (ref 70–99)
Potassium: 2.5 mmol/L — CL (ref 3.5–5.1)
Sodium: 136 mmol/L (ref 135–145)
Total Bilirubin: 1.2 mg/dL (ref 0.0–1.2)
Total Protein: 6.6 g/dL (ref 6.5–8.1)

## 2023-11-15 LAB — TROPONIN I (HIGH SENSITIVITY): Troponin I (High Sensitivity): 19 ng/L — ABNORMAL HIGH (ref <18)

## 2023-11-15 LAB — MAGNESIUM: Magnesium: 2.2 mg/dL (ref 1.7–2.4)

## 2023-11-15 LAB — TSH: TSH: 2.054 u[IU]/mL (ref 0.350–4.500)

## 2023-11-15 MED ORDER — SODIUM CHLORIDE 0.9 % IV SOLN
1.0000 g | Freq: Once | INTRAVENOUS | Status: AC
  Administered 2023-11-15: 1 g via INTRAVENOUS
  Filled 2023-11-15: qty 10

## 2023-11-15 MED ORDER — SODIUM CHLORIDE 0.9 % IV BOLUS
1000.0000 mL | Freq: Once | INTRAVENOUS | Status: AC
  Administered 2023-11-15: 1000 mL via INTRAVENOUS

## 2023-11-15 MED ORDER — POTASSIUM CHLORIDE CRYS ER 20 MEQ PO TBCR
40.0000 meq | EXTENDED_RELEASE_TABLET | Freq: Once | ORAL | Status: AC
  Administered 2023-11-15: 40 meq via ORAL
  Filled 2023-11-15: qty 2

## 2023-11-15 NOTE — ED Provider Notes (Signed)
 [facility] Provider Note    Event Date/Time   First MD Initiated Contact with Patient 11/15/23 2135     (approximate)   History   Weakness   HPI {Remember to add pertinent medical, surgical, social, and/or OB history to HPI:1} Jenna Carlson is a 73 y.o. female  ***       Physical Exam   Triage Vital Signs: ED Triage Vitals  Encounter Vitals Group     BP 11/15/23 1610 95/77     Systolic BP Percentile --      Diastolic BP Percentile --      Pulse Rate 11/15/23 1612 77     Resp 11/15/23 1610 18     Temp 11/15/23 1610 97.8 F (36.6 C)     Temp Source 11/15/23 1610 Oral     SpO2 11/15/23 1612 100 %     Weight 11/15/23 1612 163 lb (73.9 kg)     Height 11/15/23 1612 5\' 5"  (1.651 m)     Head Circumference --      Peak Flow --      Pain Score 11/15/23 1610 0     Pain Loc --      Pain Education --      Exclude from Growth Chart --     Most recent vital signs: Vitals:   11/15/23 1610 11/15/23 1612  BP: 95/77   Pulse:  77  Resp: 18   Temp: 97.8 F (36.6 C)   SpO2:  100%    {Only need to document appropriate and relevant physical exam:1} General: Awake, no distress. *** CV:  Good peripheral perfusion. *** Resp:  Normal effort. *** Abd:  No distention. *** Other:  ***   ED Results / Procedures / Treatments   Labs (all labs ordered are listed, but only abnormal results are displayed) Labs Reviewed  COMPREHENSIVE METABOLIC PANEL WITH GFR - Abnormal; Notable for the following components:      Result Value   Potassium 2.5 (*)    CO2 15 (*)    Creatinine, Ser 1.22 (*)    Calcium  8.7 (*)    Albumin 3.4 (*)    GFR, Estimated 47 (*)    All other components within normal limits  CBC - Abnormal; Notable for the following components:   WBC 11.9 (*)    All other components within normal limits  URINALYSIS, ROUTINE W REFLEX MICROSCOPIC  CBG MONITORING, ED     EKG  ***   RADIOLOGY *** {USE THE WORD "INTERPRETED"!! You MUST  document your own interpretation of imaging, as well as the fact that you reviewed the radiologist's report!:1}   PROCEDURES:  Critical Care performed: Yes  CRITICAL CARE Performed by: Marylynn Soho   Total critical care time: *** minutes  Critical care time was exclusive of separately billable procedures and treating other patients.  Critical care was necessary to treat or prevent imminent or life-threatening deterioration.  Critical care was time spent personally by me on the following activities: development of treatment plan with patient and/or surrogate as well as nursing, discussions with consultants, evaluation of patient's response to treatment, examination of patient, obtaining history from patient or surrogate, ordering and performing treatments and interventions, ordering and review of laboratory studies, ordering and review of radiographic studies, pulse oximetry and re-evaluation of patient's condition.   Procedures    MEDICATIONS ORDERED IN ED: Medications - No data to display   IMPRESSION / MDM / ASSESSMENT AND PLAN / ED COURSE  I reviewed the triage vital signs and the nursing notes.                              Differential diagnosis includes, but is not limited to, ***  Patient's presentation is most consistent with {EM COPA:27473}   ***The patient is on the cardiac monitor to evaluate for evidence of arrhythmia and/or significant heart rate changes.  ***      FINAL CLINICAL IMPRESSION(S) / ED DIAGNOSES   Final diagnoses:  None        Rx / DC Orders   ED Discharge Orders     None        Note:  This document was prepared using Dragon voice recognition software and may include unintentional dictation errors.

## 2023-11-15 NOTE — ED Triage Notes (Signed)
 See first nurse note.

## 2023-11-15 NOTE — ED Triage Notes (Signed)
 Arrives from home via [facility]> C/O weakness x 1 month. Seen PCP for same, diagnosed with UTI several weeks ago, finished Antibx. Weakness continues.  18g LAC  72 96% RA 125/72  Hx HTN  CBG: 93  97.4 oral

## 2023-11-16 DIAGNOSIS — F32A Depression, unspecified: Secondary | ICD-10-CM | POA: Insufficient documentation

## 2023-11-16 DIAGNOSIS — R531 Weakness: Secondary | ICD-10-CM | POA: Diagnosis not present

## 2023-11-16 DIAGNOSIS — N390 Urinary tract infection, site not specified: Secondary | ICD-10-CM

## 2023-11-16 DIAGNOSIS — N179 Acute kidney failure, unspecified: Secondary | ICD-10-CM

## 2023-11-16 DIAGNOSIS — K219 Gastro-esophageal reflux disease without esophagitis: Secondary | ICD-10-CM | POA: Insufficient documentation

## 2023-11-16 DIAGNOSIS — I10 Essential (primary) hypertension: Secondary | ICD-10-CM | POA: Insufficient documentation

## 2023-11-16 DIAGNOSIS — J45909 Unspecified asthma, uncomplicated: Secondary | ICD-10-CM | POA: Insufficient documentation

## 2023-11-16 LAB — CBC
HCT: 37.9 % (ref 36.0–46.0)
Hemoglobin: 12.6 g/dL (ref 12.0–15.0)
MCH: 33.2 pg (ref 26.0–34.0)
MCHC: 33.2 g/dL (ref 30.0–36.0)
MCV: 100 fL (ref 80.0–100.0)
Platelets: 279 10*3/uL (ref 150–400)
RBC: 3.79 MIL/uL — ABNORMAL LOW (ref 3.87–5.11)
RDW: 13.1 % (ref 11.5–15.5)
WBC: 13.3 10*3/uL — ABNORMAL HIGH (ref 4.0–10.5)
nRBC: 0 % (ref 0.0–0.2)

## 2023-11-16 LAB — BASIC METABOLIC PANEL WITH GFR
Anion gap: 15 (ref 5–15)
BUN: 14 mg/dL (ref 8–23)
CO2: 13 mmol/L — ABNORMAL LOW (ref 22–32)
Calcium: 8 mg/dL — ABNORMAL LOW (ref 8.9–10.3)
Chloride: 109 mmol/L (ref 98–111)
Creatinine, Ser: 1.36 mg/dL — ABNORMAL HIGH (ref 0.44–1.00)
GFR, Estimated: 41 mL/min — ABNORMAL LOW (ref >60)
Glucose, Bld: 69 mg/dL — ABNORMAL LOW (ref 70–99)
Potassium: 3.5 mmol/L (ref 3.5–5.1)
Sodium: 137 mmol/L (ref 135–145)

## 2023-11-16 LAB — GLUCOSE, CAPILLARY
Glucose-Capillary: 69 mg/dL — ABNORMAL LOW (ref 70–99)
Glucose-Capillary: 80 mg/dL (ref 70–99)

## 2023-11-16 MED ORDER — SODIUM CHLORIDE 0.9 % IV SOLN
1.0000 g | INTRAVENOUS | Status: DC
  Administered 2023-11-16: 1 g via INTRAVENOUS
  Filled 2023-11-16: qty 10

## 2023-11-16 MED ORDER — CARVEDILOL 6.25 MG PO TABS: 6.2500 mg | ORAL_TABLET | Freq: Two times a day (BID) | ORAL | Status: DC

## 2023-11-16 MED ORDER — POTASSIUM CHLORIDE IN NACL 20-0.9 MEQ/L-% IV SOLN
INTRAVENOUS | Status: DC
  Filled 2023-11-16 (×2): qty 1000

## 2023-11-16 MED ORDER — ENSURE PLUS HIGH PROTEIN PO LIQD: 237.0000 mL | Freq: Two times a day (BID) | ORAL | Status: DC

## 2023-11-16 MED ORDER — ACETAMINOPHEN 650 MG RE SUPP: 650.0000 mg | Freq: Four times a day (QID) | RECTAL | Status: DC | PRN

## 2023-11-16 MED ORDER — CARVEDILOL 3.125 MG PO TABS
3.1250 mg | ORAL_TABLET | Freq: Two times a day (BID) | ORAL | Status: DC
  Administered 2023-11-16 – 2023-11-17 (×2): 3.125 mg via ORAL
  Filled 2023-11-16 (×2): qty 1

## 2023-11-16 MED ORDER — POTASSIUM CHLORIDE 20 MEQ PO PACK
40.0000 meq | PACK | Freq: Once | ORAL | Status: AC
  Administered 2023-11-16: 40 meq via ORAL
  Filled 2023-11-16: qty 2

## 2023-11-16 MED ORDER — CLOPIDOGREL BISULFATE 75 MG PO TABS
75.0000 mg | ORAL_TABLET | Freq: Every day | ORAL | Status: DC
  Administered 2023-11-16 – 2023-11-17 (×2): 75 mg via ORAL
  Filled 2023-11-16 (×2): qty 1

## 2023-11-16 MED ORDER — MAGNESIUM HYDROXIDE 400 MG/5ML PO SUSP
30.0000 mL | Freq: Every day | ORAL | Status: DC | PRN
Start: 2023-11-16 — End: 2023-11-17

## 2023-11-16 MED ORDER — VERAPAMIL HCL ER 240 MG PO TBCR: 240.0000 mg | EXTENDED_RELEASE_TABLET | Freq: Every day | ORAL | Status: DC

## 2023-11-16 MED ORDER — ENOXAPARIN SODIUM 40 MG/0.4ML IJ SOSY
40.0000 mg | PREFILLED_SYRINGE | INTRAMUSCULAR | Status: DC
  Administered 2023-11-16 – 2023-11-17 (×2): 40 mg via SUBCUTANEOUS
  Filled 2023-11-16 (×2): qty 0.4

## 2023-11-16 MED ORDER — SERTRALINE HCL 50 MG PO TABS
50.0000 mg | ORAL_TABLET | Freq: Every day | ORAL | Status: DC
  Administered 2023-11-16 – 2023-11-17 (×2): 50 mg via ORAL
  Filled 2023-11-16 (×2): qty 1

## 2023-11-16 MED ORDER — LORATADINE 10 MG PO TABS
10.0000 mg | ORAL_TABLET | Freq: Every day | ORAL | Status: DC
  Administered 2023-11-17: 10 mg via ORAL
  Filled 2023-11-16: qty 1

## 2023-11-16 MED ORDER — POTASSIUM CHLORIDE CRYS ER 10 MEQ PO TBCR
20.0000 meq | EXTENDED_RELEASE_TABLET | Freq: Two times a day (BID) | ORAL | Status: DC
  Administered 2023-11-16 (×2): 20 meq via ORAL
  Filled 2023-11-16 (×3): qty 2

## 2023-11-16 MED ORDER — VERAPAMIL HCL ER 180 MG PO TBCR
180.0000 mg | EXTENDED_RELEASE_TABLET | Freq: Every day | ORAL | Status: DC
  Filled 2023-11-16: qty 1

## 2023-11-16 MED ORDER — IPRATROPIUM-ALBUTEROL 0.5-2.5 (3) MG/3ML IN SOLN: 3.0000 mL | Freq: Four times a day (QID) | RESPIRATORY_TRACT | Status: DC | PRN

## 2023-11-16 MED ORDER — MONTELUKAST SODIUM 10 MG PO TABS
10.0000 mg | ORAL_TABLET | Freq: Every day | ORAL | Status: DC
  Administered 2023-11-16: 10 mg via ORAL
  Filled 2023-11-16: qty 1

## 2023-11-16 MED ORDER — FLUTICASONE FUROATE-VILANTEROL 100-25 MCG/ACT IN AEPB
1.0000 | INHALATION_SPRAY | Freq: Every day | RESPIRATORY_TRACT | Status: DC
  Administered 2023-11-16 – 2023-11-17 (×2): 1 via RESPIRATORY_TRACT
  Filled 2023-11-16: qty 28

## 2023-11-16 MED ORDER — ROSUVASTATIN CALCIUM 20 MG PO TABS
20.0000 mg | ORAL_TABLET | Freq: Every day | ORAL | Status: DC
  Administered 2023-11-16 – 2023-11-17 (×2): 20 mg via ORAL
  Filled 2023-11-16 (×2): qty 1

## 2023-11-16 MED ORDER — ONDANSETRON HCL 4 MG/2ML IJ SOLN: 4.0000 mg | Freq: Four times a day (QID) | INTRAMUSCULAR | Status: DC | PRN

## 2023-11-16 MED ORDER — TRAZODONE HCL 50 MG PO TABS
25.0000 mg | ORAL_TABLET | Freq: Every evening | ORAL | Status: DC | PRN
Start: 2023-11-16 — End: 2023-11-17

## 2023-11-16 MED ORDER — ONDANSETRON HCL 4 MG PO TABS
4.0000 mg | ORAL_TABLET | Freq: Four times a day (QID) | ORAL | Status: DC | PRN
Start: 2023-11-16 — End: 2023-11-17

## 2023-11-16 MED ORDER — ACETAMINOPHEN 325 MG PO TABS
650.0000 mg | ORAL_TABLET | Freq: Four times a day (QID) | ORAL | Status: DC | PRN
  Administered 2023-11-16: 650 mg via ORAL
  Filled 2023-11-16: qty 2

## 2023-11-16 MED ORDER — PANTOPRAZOLE SODIUM 40 MG PO TBEC
40.0000 mg | DELAYED_RELEASE_TABLET | Freq: Every day | ORAL | Status: DC
  Administered 2023-11-16 – 2023-11-17 (×2): 40 mg via ORAL
  Filled 2023-11-16 (×2): qty 1

## 2023-11-16 NOTE — Evaluation (Signed)
 Physical Therapy Evaluation Patient Details Name: Jenna Carlson MRN: [id number] DOB: [birth year] Today's Date: 11/16/2023  History of Present Illness  Pt is a 73 yo female that presented to ED for weakness. Workup for hypokalemia, UTI. PMH of depression, asthma, GERD, HTN.  Clinical Impression  The pt was A&Ox4, reported no pain and moderate improvement in fatigue from admission. For the last several weeks the pt stated she has had increasing weakness, has been using her RW in home. Lives alone, modI for ADLs, friends assist with driving/groceries/appts, etc. Denies any recent falls.   The pt was able to perform bed mobility modI. Sit <> stand with RW and CGA, and she ambulated ~132ft. No unsteadiness noted, decreased gait velocity and step length. Pt reported still weaker than her baseline. Pt declined OOB to chair (agreeable to sit up in chair for lunch).  Overall the patient demonstrated deficits (see "PT Problem List") that impede the patient's functional abilities, safety, and mobility and would benefit from skilled PT intervention.          If plan is discharge home, recommend the following: Assistance with cooking/housework;Assist for transportation   Can travel by private vehicle        Equipment Recommendations None recommended by PT  Recommendations for Other Services       Functional Status Assessment Patient has had a recent decline in their functional status and demonstrates the ability to make significant improvements in function in a reasonable and predictable amount of time.     Precautions / Restrictions Precautions Precautions: Fall Recall of Precautions/Restrictions: Intact Restrictions Weight Bearing Restrictions Per Provider Order: No      Mobility  Bed Mobility Overal bed mobility: Modified Independent                  Transfers Overall transfer level: Needs assistance Equipment used: Rolling walker (2 wheels) Transfers: Sit to/from Stand Sit  to Stand: Contact guard assist           General transfer comment: extra time, effortful    Ambulation/Gait Ambulation/Gait assistance: Contact guard assist Gait Distance (Feet): 120 Feet Assistive device: Rolling walker (2 wheels)   Gait velocity: decreased     General Gait Details: able to ambulate and converse, no LOB, pt reported some fatigue with activity though improved from admission  Stairs            Wheelchair Mobility     Tilt Bed    Modified Rankin (Stroke Patients Only)       Balance Overall balance assessment: Needs assistance Sitting-balance support: Feet supported Sitting balance-Leahy Scale: Good     Standing balance support: Bilateral upper extremity supported, During functional activity Standing balance-Leahy Scale: Fair                               Pertinent Vitals/Pain Pain Assessment Pain Assessment: No/denies pain    Home Living Family/patient expects to be discharged to:: Private residence Living Arrangements: Alone Available Help at Discharge: Family;Friend(s) Type of Home: House Home Access: Stairs to enter Entrance Stairs-Rails: Can reach both Entrance Stairs-Number of Steps: 5   Home Layout: Two level;Able to live on main level with bedroom/bathroom Home Equipment: Toilet riser;Shower Counsellor (2 wheels);Rollator (4 wheels);Cane - single point;Hand held shower head      Prior Function Prior Level of Function : Independent/Modified Independent             Mobility Comments:  friends assist with driving for errands/groceries lately. ambulatory with RW especially the last couple of weeks; uses [facility] to get to her car for appts and then transport/WC for appts ADLs Comments: independent     Extremity/Trunk Assessment   Upper Extremity Assessment Upper Extremity Assessment: Overall WFL for tasks assessed    Lower Extremity Assessment Lower Extremity Assessment: Generalized weakness (able to  lift BLE against gravity)       Communication        Cognition Arousal: Alert Behavior During Therapy: WFL for tasks assessed/performed   PT - Cognitive impairments: No apparent impairments                                 Cueing       General Comments      Exercises     Assessment/Plan    PT Assessment Patient needs continued PT services  PT Problem List Decreased strength;Decreased balance;Decreased mobility;Decreased activity tolerance       PT Treatment Interventions DME instruction;Balance training;Gait training;Neuromuscular re-education;Stair training;Functional mobility training;Patient/family education;Therapeutic exercise;Therapeutic activities    PT Goals (Current goals can be found in the Care Plan section)  Acute Rehab PT Goals Patient Stated Goal: to get her strength back PT Goal Formulation: With patient Time For Goal Achievement: 11/30/23 Potential to Achieve Goals: Good    Frequency Min 1X/week     Co-evaluation               AM-PAC PT "6 Clicks" Mobility  Outcome Measure Help needed turning from your back to your side while in a flat bed without using bedrails?: None Help needed moving from lying on your back to sitting on the side of a flat bed without using bedrails?: None Help needed moving to and from a bed to a chair (including a wheelchair)?: None Help needed standing up from a chair using your arms (e.g., wheelchair or bedside chair)?: None Help needed to walk in hospital room?: A Little Help needed climbing 3-5 steps with a railing? : A Little 6 Click Score: 22    End of Session Equipment Utilized During Treatment: Gait belt Activity Tolerance: Patient tolerated treatment well Patient left: in bed;with call bell/phone within reach;with bed alarm set Nurse Communication: Mobility status PT Visit Diagnosis: Difficulty in walking, not elsewhere classified (R26.2);Muscle weakness (generalized) (M62.81)    Time:  1610-9604 PT Time Calculation (min) (ACUTE ONLY): 19 min   Charges:   PT Evaluation $PT Eval Low Complexity: 1 Low PT Treatments $Therapeutic Activity: 8-22 mins PT General Charges $$ ACUTE PT VISIT: 1 Visit         Darien Eden PT, DPT 10:56 AM,11/16/23

## 2023-11-16 NOTE — Assessment & Plan Note (Signed)
-   Will continue her inhalers and Singulair .

## 2023-11-16 NOTE — Assessment & Plan Note (Signed)
Will continue antihypertensive therapy.

## 2023-11-16 NOTE — Plan of Care (Signed)
 Patient alert & oriented x 4. Cooperative and appropriate with staff. Took all scheduled meds. Ambulated in room and to bathroom supervised. Increased appetite and potassium levels reached normal limits. All safety precautions maintained.    Problem: Education: Goal: Knowledge of General Education information will improve Description: Including pain rating scale, medication(s)/side effects and non-pharmacologic comfort measures Outcome: Progressing   Problem: Health Behavior/Discharge Planning: Goal: Ability to manage health-related needs will improve Outcome: Progressing   Problem: Clinical Measurements: Goal: Ability to maintain clinical measurements within normal limits will improve Outcome: Progressing Goal: Will remain free from infection Outcome: Progressing   Problem: Activity: Goal: Risk for activity intolerance will decrease Outcome: Progressing   Problem: Nutrition: Goal: Adequate nutrition will be maintained Outcome: Progressing   Problem: Elimination: Goal: Will not experience complications related to bowel motility Outcome: Progressing   Problem: Pain Managment: Goal: General experience of comfort will improve and/or be controlled Outcome: Progressing   Problem: Safety: Goal: Ability to remain free from injury will improve Outcome: Progressing   Problem: Skin Integrity: Goal: Risk for impaired skin integrity will decrease Outcome: Progressing

## 2023-11-16 NOTE — ED Provider Notes (Incomplete)
 [facility] Provider Note    Event Date/Time   First MD Initiated Contact with Patient 11/15/23 2135     (approximate)   History   Weakness   HPI  Jenna Carlson is a 73 y.o. female who presents to the emergency department today because of concerns for worsening weakness.  Patient has been weak for the past month.  Over the past couple days have been worse.  She has seen her primary care for this.  States she was treated for a UTI recently.  Currently denies any dysuria or bad odor to urine.  States that she has noticed decreased ability to ambulate.  Patient denies any recent fevers.  Denies any chest pain.  States she has a family history of thyroid  disorder.     Physical Exam   Triage Vital Signs: ED Triage Vitals  Encounter Vitals Group     BP 11/15/23 1610 95/77     Systolic BP Percentile --      Diastolic BP Percentile --      Pulse Rate 11/15/23 1612 77     Resp 11/15/23 1610 18     Temp 11/15/23 1610 97.8 F (36.6 C)     Temp Source 11/15/23 1610 Oral     SpO2 11/15/23 1612 100 %     Weight 11/15/23 1612 163 lb (73.9 kg)     Height 11/15/23 1612 5\' 5"  (1.651 m)     Head Circumference --      Peak Flow --      Pain Score 11/15/23 1610 0     Pain Loc --      Pain Education --      Exclude from Growth Chart --     Most recent vital signs: Vitals:   11/15/23 1610 11/15/23 1612  BP: 95/77   Pulse:  77  Resp: 18   Temp: 97.8 F (36.6 C)   SpO2:  100%   General: Awake, alert, oriented. CV:  Good peripheral perfusion. Regular rate and rhythm. Resp:  Normal effort. Lungs clear. Abd:  No distention. Non tender.   ED Results / Procedures / Treatments   Labs (all labs ordered are listed, but only abnormal results are displayed) Labs Reviewed  COMPREHENSIVE METABOLIC PANEL WITH GFR - Abnormal; Notable for the following components:      Result Value   Potassium 2.5 (*)    CO2 15 (*)    Creatinine, Ser 1.22 (*)    Calcium  8.7  (*)    Albumin 3.4 (*)    GFR, Estimated 47 (*)    All other components within normal limits  CBC - Abnormal; Notable for the following components:   WBC 11.9 (*)    All other components within normal limits  URINALYSIS, ROUTINE W REFLEX MICROSCOPIC  CBG MONITORING, ED     EKG  ***   RADIOLOGY *** {USE THE WORD "INTERPRETED"!! You MUST document your own interpretation of imaging, as well as the fact that you reviewed the radiologist's report!:1}   PROCEDURES:  Critical Care performed: Yes  CRITICAL CARE Performed by: Marylynn Soho   Total critical care time: *** minutes  Critical care time was exclusive of separately billable procedures and treating other patients.  Critical care was necessary to treat or prevent imminent or life-threatening deterioration.  Critical care was time spent personally by me on the following activities: development of treatment plan with patient and/or surrogate as well as nursing, discussions with consultants, evaluation of  patient's response to treatment, examination of patient, obtaining history from patient or surrogate, ordering and performing treatments and interventions, ordering and review of laboratory studies, ordering and review of radiographic studies, pulse oximetry and re-evaluation of patient's condition.   Procedures    MEDICATIONS ORDERED IN ED: Medications - No data to display   IMPRESSION / MDM / ASSESSMENT AND PLAN / ED COURSE  I reviewed the triage vital signs and the nursing notes.                              Differential diagnosis includes, but is not limited to, ***  Patient's presentation is most consistent with {EM COPA:27473}   ***The patient is on the cardiac monitor to evaluate for evidence of arrhythmia and/or significant heart rate changes.  ***      FINAL CLINICAL IMPRESSION(S) / ED DIAGNOSES   Final diagnoses:  None        Rx / DC Orders   ED Discharge Orders     None         Note:  This document was prepared using Dragon voice recognition software and may include unintentional dictation errors.

## 2023-11-16 NOTE — Assessment & Plan Note (Signed)
Will continue Zoloft. 

## 2023-11-16 NOTE — Evaluation (Signed)
 Occupational Therapy Evaluation Patient Details Name: Jenna Carlson MRN: [id number] DOB: [birth year] Today's Date: 11/16/2023   History of Present Illness   Pt is a 73 yo female that presented to ED for weakness. Workup for hypokalemia, UTI. PMH of depression, asthma, GERD, HTN.    Clinical Impressions Pt was seen for OT evaluation this date. Prior to hospital admission, pt was living alone and generally indep with basic ADL. However, she reports getting fatigued very quickly. Pt presents to acute OT demonstrating impaired ADL performance and functional mobility 2/2 decreased BLE strength and activity tolerance (See OT problem list for additional functional deficits). Pt currently requires CGA for ADL mobility and PRN assist for more tiring ADL including bathing. Pt educated in ECS to support safety, as pt endorses she gets fatigued quickly at home. Educated in activity pacing, work simplification. Functionally limited at time of evaluation 2/2 pt reporting recent attempts to take medication and having difficulty getting larger pills down. RN notified. Pt would benefit from skilled OT services to address noted impairments and functional limitations (see below for any additional details) in order to maximize safety and independence while minimizing falls risk and caregiver burden.    If plan is discharge home, recommend the following:   A little help with walking and/or transfers;A little help with bathing/dressing/bathroom;Assistance with cooking/housework;Assist for transportation     Functional Status Assessment   Patient has had a recent decline in their functional status and demonstrates the ability to make significant improvements in function in a reasonable and predictable amount of time.     Equipment Recommendations   None recommended by OT      Precautions/Restrictions   Precautions Precautions: Fall Recall of Precautions/Restrictions: Intact Restrictions Weight  Bearing Restrictions Per Provider Order: No     Mobility Bed Mobility Overal bed mobility: Modified Independent         Transfers    General transfer comment: deferred 2/2 nausea      Balance Overall balance assessment: Needs assistance Sitting-balance support: Feet supported Sitting balance-Leahy Scale: Good       ADL either performed or assessed with clinical judgement   ADL Overall ADL's : Needs assistance/impaired      General ADL Comments: Anticipate need for CGA for ADL transfers and PRN assist for bathing. Will further assess in future sessions. Limited today 2/2 nausea.      Pertinent Vitals/Pain Pain Assessment Pain Assessment: No/denies pain     Extremity/Trunk Assessment Upper Extremity Assessment Upper Extremity Assessment: Overall WFL for tasks assessed   Lower Extremity Assessment Lower Extremity Assessment: Generalized weakness       Communication Communication Communication: No apparent difficulties   Cognition Arousal: Alert Behavior During Therapy: WFL for tasks assessed/performed Cognition: No apparent impairments        Cueing  General Comments      Pt endorses nausea after getting pill stuck in her throat. She says she got it up (pill noted on bedside table) and the other still felt like it was stuck in her throat. Provided with water per pt's request. RN notified.   Exercises Other Exercises Other Exercises: Pt educated in ECS to support safety, as pt endorses she gets fatigued quickly at home. Educated in activity pacing, work simplification.        Home Living Family/patient expects to be discharged to:: Private residence Living Arrangements: Alone Available Help at Discharge: Family;Friend(s) Type of Home: House Home Access: Stairs to enter [employer] of Steps: 5 Entrance Stairs-Rails: Can  reach both Home Layout: Two level;Able to live on main level with bedroom/bathroom     Bathroom Shower/Tub: Retail banker: Standard     Home Equipment: Toilet riser;Shower Counsellor (2 wheels);Rollator (4 wheels);Cane - single point;Hand held shower head          Prior Functioning/Environment Prior Level of Function : Independent/Modified Independent             Mobility Comments: friends assist with driving for errands/groceries lately. ambulatory with [facility] especially the last couple of weeks; uses [facility] to get to her car for appts and then transport/WC for appts ADLs Comments: independent, has meds delivered    OT Problem List: Decreased strength;Decreased activity tolerance;Decreased knowledge of use of DME or AE   OT Treatment/Interventions: Self-care/ADL training;Therapeutic exercise;Therapeutic activities;Energy conservation;DME and/or AE instruction;Patient/family education      OT Goals(Current goals can be found in the care plan section)   Acute Rehab OT Goals Patient Stated Goal: feel better and go home OT Goal Formulation: With patient Time For Goal Achievement: 11/30/23 Potential to Achieve Goals: Good ADL Goals Additional ADL Goal #1: Pt will complete all aspects of bathing, primarily from seated position with mod indep, 1/1 opportunity. Additional ADL Goal #2: Pt will verbalize plan to implement at least 1 learned ECS to maximzie safety/indep.   OT Frequency:  Min 1X/week       AM-PAC OT "6 Clicks" Daily Activity     Outcome Measure Help from another person eating meals?: None Help from another person taking care of personal grooming?: None Help from another person toileting, which includes using toliet, bedpan, or urinal?: None Help from another person bathing (including washing, rinsing, drying)?: A Little Help from another person to put on and taking off regular upper body clothing?: None Help from another person to put on and taking off regular lower body clothing?: None 6 Click Score: 23   End of Session Nurse Communication: Other  (comment) (pill stuck)  Activity Tolerance: Other (comment) (recent emesis, pill stuck) Patient left: in bed;with call bell/phone within reach;with bed alarm set  OT Visit Diagnosis: Other abnormalities of gait and mobility (R26.89);Muscle weakness (generalized) (M62.81)                Time: 1308-6578 OT Time Calculation (min): 10 min Charges:  OT General Charges $OT Visit: 1 Visit OT Evaluation $OT Eval Low Complexity: 1 Low  Berenda Breaker., MPH, MS, OTR/L ascom [phone] 11/16/23, 12:11 PM

## 2023-11-16 NOTE — Assessment & Plan Note (Addendum)
-   This is likely pretty renal secondary to nausea and anorexia with soft dehydration. - Will continue hydration with IV normal saline and follow BMP.

## 2023-11-16 NOTE — TOC Initial Note (Addendum)
 Transition of Care [facility]) - Initial/Assessment Note    Patient Details  Name: Jenna Carlson MRN: [id number] Date of Birth: November 19, 1950  [facility] CM/SW Contact:    Elsie Halo, RN Phone Number: 11/16/2023, 11:54 AM  Clinical Narrative:                 Patient is from home independently. She drives and doesn't report difficulty getting to appointments or obtaining medications. Her PCP is Dr. Alva Jewels and her pharmacists is [facility] in [place], [place]. She has DME; a walker and a cane. Her friends will drive her when she is ready for dc.    Therapy recommending HH PT/OT and the patient would like a HH Aide. Patient was active with [facility] in the past.   Expected Discharge Plan: Home/Self Care Barriers to Discharge: Continued Medical Work up   Patient Goals and CMS Choice            Expected Discharge Plan and Services   Discharge Planning Services: CM Consult   Living arrangements for the past 2 months: Single Family Home                           HH Arranged: PT          Prior Living Arrangements/Services Living arrangements for the past 2 months: Single Family Home Lives with:: Self                   Activities of Daily Living   ADL Screening (condition at time of admission) Independently performs ADLs?: Yes (appropriate for developmental age) Is the patient deaf or have difficulty hearing?: No Does the patient have difficulty seeing, even when wearing glasses/contacts?: No Does the patient have difficulty concentrating, remembering, or making decisions?: No  Permission Sought/Granted                  Emotional Assessment       Orientation: : Oriented to Self, Oriented to Place, Oriented to  Time, Oriented to Situation   Psych Involvement: No (comment)  Admission diagnosis:  Dehydration [E86.0] Hypokalemia [E87.6] Lower urinary tract infectious disease [N39.0] Weakness [R53.1] Generalized weakness  [R53.1] Patient Active Problem List   Diagnosis Date Noted   Acute lower UTI 11/16/2023   AKI (acute kidney injury) (HCC) 11/16/2023   Essential hypertension 11/16/2023   GERD without esophagitis 11/16/2023   Depression 11/16/2023   Asthma, chronic 11/16/2023   Generalized weakness 11/15/2023   Elevated gastrin level 06/01/2017   Diarrhea    Weakness 05/16/2017   Hypokalemia 05/16/2017   Multiple duodenal ulcers 05/16/2017   HTN (hypertension) 05/16/2017   HLD (hyperlipidemia) 05/16/2017   PCP:  Jimmy Moulding, MD Pharmacy:   [facility] - [place], [place] - [address] [place] [address] Phone: [phone] Fax: [phone]     Social Drivers of Health (SDOH) Social History: SDOH Screenings   Food Insecurity: No Food Insecurity (11/16/2023)  Housing: Low Risk  (11/16/2023)  Transportation Needs: No Transportation Needs (11/16/2023)  Utilities: Not At Risk (11/16/2023)  Financial Resource Strain: Low Risk  (09/03/2023)   Received from [facility]  Social Connections: Moderately Isolated (11/16/2023)  Tobacco Use: Low Risk  (11/15/2023)  Recent Concern: Tobacco Use - Medium Risk (11/05/2023)   Received from [facility]   SDOH Interventions:     Readmission Risk Interventions  No data to display

## 2023-11-16 NOTE — Progress Notes (Signed)
 PROGRESS NOTE    Jenna Carlson   MVH:[id number] DOB: Nov 14, 1950  DOA: 11/15/2023 Date of Service: 11/16/23 which is hospital day 0  PCP: Jimmy Moulding, MD    Hospital course / significant events:   HPI: Jenna Carlson is a 73 y.o. Caucasian female with medical history significant for asthma, GERD, hypertension and dyslipidemia, presented to the emergency room with generalized weakness, worsening, assoc w/ nausea and lack of appetite. Saw PCP 11/05/23 w/ complaints of nausea and generalized weakness, reduced BP meds and got CBC, CMP, TSH --> WBC 11, Hgb 13.1, MCV 102.3, Cr 0.8 baseline, low total protein 5.9, no result for TSH, UA (+)WBC ajd 2+ protein. Also saw PCP 10/22/23 w/ complaints of positional dizziness and episodes of weakness, weight loss, noted reduced BP Rx previous visit.   05/29: admitted to hospitalist, continue on abx, await urine cultures 05/30: cultures pending, adjusting BP meds, renal fxn still abn continue IV fluids, monitor BMP   Consultants:  none  Procedures/Surgeries: none      ASSESSMENT & PLAN:   Generalized weakness likely multifactorial. Question d/t UTI, hypokalemia, dehydration. Treat underlying cause(s) as below  PT/OT    Hypokalemia (Normal magnesium ) Replace as needed Monitor BMP   UTI Ceftriaxone Urine cultures pending   AKI (acute kidney injury)  Likely pre-renal secondary to nausea and anorexia w/ dehydration IV normal saline  Hold ACE/ARB follow BMP.  Proteinuria May need outpatient follow up   Essential HTN BP low here Reduced carvedilol   Holding home ACE/ARB   Asthma, chronic, not in exacerbation continue her Breo, Claritin, Singulair .   Depression Zoloft .   GERD without esophagitis PPI   Essential hypertension Home     overweight based on BMI: Body mass index is 27.12 kg/m.Aaron Aas Significantly low or high BMI is associated with higher medical risk.  Underweight - under 18  overweight - 25 to  29 obese - 30 or more Class 1 obesity: BMI of 30.0 to 34 Class 2 obesity: BMI of 35.0 to 39 Class 3 obesity: BMI of 40.0 to 49 Super Morbid Obesity: BMI 50-59 Super-super Morbid Obesity: BMI 60+ Healthy nutrition and physical activity advised as adjunct to other disease management and risk reduction treatments    DVT prophylaxis: lovenox  IV fluids: completed continuous IV fluids  Nutrition: regular Central lines / other devices: none  Code Status: FULL CODE ACP documentation reviewed: none on file in [facility]  [facility] needs: Home health (orders are in)  Medical barriers to dispo: urine culture pending and AKI. Expected medical readiness for discharge tomorrow.                    Subjective / Brief ROS:  Patient reports feeling a bit better today Denies CP/SOB.  Pain controlled.  Denies new weakness.  Tolerating diet.  Reports no concerns w/ urination/defecation.   Family Communication: none at this time    Objective Findings:  Vitals:   11/16/23 0000 11/16/23 0040 11/16/23 0620 11/16/23 1140  BP: 123/78 139/83 134/67 125/80  Pulse: 69 72 68 71  Resp: 17 18 18 18   Temp:  97.8 F (36.6 C) 97.6 F (36.4 C) 97.9 F (36.6 C)  TempSrc:    Oral  SpO2: 100% 99% 100% 100%  Weight:      Height:        Intake/Output Summary (Last 24 hours) at 11/16/2023 1245 Last data filed at 11/16/2023 1037 Gross per 24 hour  Intake 419.19 ml  Output --  Net  419.19 ml   Filed Weights   11/15/23 1612  Weight: 73.9 kg    Examination:  Physical Exam Constitutional:      General: She is not in acute distress.    Appearance: She is not ill-appearing.  Cardiovascular:     Rate and Rhythm: Normal rate and regular rhythm.  Pulmonary:     Effort: Pulmonary effort is normal.     Breath sounds: Normal breath sounds.  Abdominal:     General: Abdomen is flat. There is no distension.     Palpations: Abdomen is soft.  Musculoskeletal:     Right lower leg: No edema.      Left lower leg: No edema.  Neurological:     General: No focal deficit present.     Mental Status: She is alert and oriented to person, place, and time. Mental status is at baseline.  Psychiatric:        Mood and Affect: Mood normal.        Behavior: Behavior normal.          Scheduled Medications:   carvedilol   3.125 mg Oral BID WC   clopidogrel  75 mg Oral Daily   enoxaparin  (LOVENOX ) injection  40 mg Subcutaneous Q24H   feeding supplement  237 mL Oral BID BM   fluticasone  furoate-vilanterol  1 puff Inhalation Daily   [START ON 11/17/2023] loratadine  10 mg Oral Daily   montelukast   10 mg Oral QHS   pantoprazole   40 mg Oral Daily   potassium chloride   20 mEq Oral BID   rosuvastatin  20 mg Oral Daily   sertraline   50 mg Oral Daily    Continuous Infusions:  cefTRIAXone (ROCEPHIN)  IV      PRN Medications:  acetaminophen  **OR** acetaminophen , ipratropium-albuterol, magnesium  hydroxide, ondansetron  **OR** ondansetron  (ZOFRAN ) IV, traZODone  Antimicrobials from admission:  Anti-infectives (From admission, onward)    Start     Dose/Rate Route Frequency Ordered Stop   11/16/23 2200  cefTRIAXone (ROCEPHIN) 1 g in sodium chloride  0.9 % 100 mL IVPB        1 g 200 mL/hr over 30 Minutes Intravenous Every 24 hours 11/16/23 0435     11/15/23 2330  cefTRIAXone (ROCEPHIN) 1 g in sodium chloride  0.9 % 100 mL IVPB        1 g 200 mL/hr over 30 Minutes Intravenous  Once 11/15/23 2319 11/16/23 0206           Data Reviewed:  I have personally reviewed the following...  CBC: Recent Labs  Lab 11/15/23 1612 11/16/23 0227  WBC 11.9* 13.3*  HGB 13.1 12.6  HCT 39.7 37.9  MCV 99.7 100.0  PLT 369 279   Basic Metabolic Panel: Recent Labs  Lab 11/15/23 1612 11/16/23 0227  NA 136 137  K 2.5* 3.5  CL 107 109  CO2 15* 13*  GLUCOSE 99 69*  BUN 14 14  CREATININE 1.22* 1.36*  CALCIUM  8.7* 8.0*  MG 2.2  --    GFR: Estimated Creatinine Clearance: 36.6 mL/min (A) (by C-G  formula based on SCr of 1.36 mg/dL (H)). Liver Function Tests: Recent Labs  Lab 11/15/23 1612  AST 30  ALT 16  ALKPHOS 41  BILITOT 1.2  PROT 6.6  ALBUMIN 3.4*   No results for input(s): "LIPASE", "AMYLASE" in the last 168 hours. No results for input(s): "AMMONIA" in the last 168 hours. Coagulation Profile: No results for input(s): "INR", "PROTIME" in the last 168 hours. Cardiac Enzymes: No results  for input(s): "CKTOTAL", "CKMB", "CKMBINDEX", "TROPONINI" in the last 168 hours. BNP (last 3 results) No results for input(s): "PROBNP" in the last 8760 hours. HbA1C: No results for input(s): "HGBA1C" in the last 72 hours. CBG: Recent Labs  Lab 11/16/23 1136  GLUCAP 69*   Lipid Profile: No results for input(s): "CHOL", "HDL", "LDLCALC", "TRIG", "CHOLHDL", "LDLDIRECT" in the last 72 hours. Thyroid  Function Tests: Recent Labs    11/15/23 1612  TSH 2.054   Anemia Panel: No results for input(s): "VITAMINB12", "FOLATE", "FERRITIN", "TIBC", "IRON", "RETICCTPCT" in the last 72 hours. Most Recent Urinalysis On File:     Component Value Date/Time   COLORURINE AMBER (A) 11/15/2023 2144   APPEARANCEUR CLOUDY (A) 11/15/2023 2144   LABSPEC 1.031 (H) 11/15/2023 2144   PHURINE 5.0 11/15/2023 2144   GLUCOSEU NEGATIVE 11/15/2023 2144   HGBUR NEGATIVE 11/15/2023 2144   BILIRUBINUR SMALL (A) 11/15/2023 2144   KETONESUR 20 (A) 11/15/2023 2144   PROTEINUR >=300 (A) 11/15/2023 2144   NITRITE NEGATIVE 11/15/2023 2144   LEUKOCYTESUR MODERATE (A) 11/15/2023 2144   Sepsis Labs: @LABRCNTIP (procalcitonin:4,lacticidven:4) Microbiology: No results found for this or any previous visit (from the past 240 hours).    Radiology Studies last 3 days: No results found.       Lorrane Mccay, DO [facility] 11/16/2023, 12:45 PM    Dictation software may have been used to generate the above note. Typos may occur and escape review in typed/dictated notes. Please contact Dr Authur Leghorn  directly for clarity if needed.  Staff may message me via secure chat in Epic  but this may not receive an immediate response,  please page me for urgent matters!  If 7PM-7AM, please contact night coverage [email or web address]

## 2023-11-16 NOTE — Assessment & Plan Note (Signed)
-   This is likely multifactorial.  It could be related to UTI as well as hypokalemia and dehydration. - Management as below.

## 2023-11-16 NOTE — Plan of Care (Signed)

## 2023-11-16 NOTE — Assessment & Plan Note (Signed)
 Will continue PPI therapy.

## 2023-11-16 NOTE — H&P (Signed)
 [place]   PATIENT NAME: Jenna Carlson    MR#:  [id number]  DATE OF BIRTH:  1950-08-14  DATE OF ADMISSION:  11/15/2023  PRIMARY CARE PHYSICIAN: Jimmy Moulding, MD   Patient is coming from: Home  REQUESTING/REFERRING PHYSICIAN: Marylynn Soho, MD  CHIEF COMPLAINT:   Chief Complaint  Patient presents with   Weakness    HISTORY OF PRESENT ILLNESS:  Jenna Carlson is a 73 y.o. Caucasian female with medical history significant for asthma, GERD, hypertension and dyslipidemia, presented to the emergency room with acute onset of generalized weakness for the last week.  She has been feeling weak all over.  Got worse since Sunday.  She admitted to nausea and lack of appetite secondarily.  No vomiting or abdominal pain or diarrhea or melena or bright red bleeding per rectum.  No fever or chills.  She denied any leg cramps.  No dysuria, oliguria or hematuria or flank pain.  No fever or chills.  No bleeding diathesis.  ED Course: When she came to the ER, Vital signs were within normal except for BP 195/77 and later on was 115/80.  Labs revealed hypokalemia of 2.5 with CO2 13 with calcium  8.7 otherwise unremarkable CMP.  High-sensitivity troponin was 19 and CBC showed WBCs of 11.9.  TSH was 2.05 and UA was positive for UTI.  Urine culture was sent. EKG as reviewed by me : EKG showed sinus rhythm with a rate of 75 with premature atrial complexes and poor R wave progression. Imaging: None.  The patient was given 1 g of IV Rocephin, 40 mill equivalent p.o. potassium chloride  and 1 L bolus of IV normal saline.  She will be admitted to the medical telemetry observation bed for further evaluation and management. PAST MEDICAL HISTORY:   Past Medical History:  Diagnosis Date   Allergic rhinitis    Asthma    Breast cancer (HCC) 1990's   right breast   Cancer (HCC)    BREAST   Depression    Edema    GERD (gastroesophageal reflux disease)    Hyperlipidemia    Hypertension     Osteoporosis, postmenopausal    Personal history of chemotherapy     PAST SURGICAL HISTORY:   Past Surgical History:  Procedure Laterality Date   COLONOSCOPY N/A 07/10/2022   Procedure: COLONOSCOPY;  Surgeon: Quintin Buckle, DO;  Location: [facility] ENDOSCOPY;  Service: Gastroenterology;  Laterality: N/A;   COLONOSCOPY N/A 01/15/2023   Procedure: COLONOSCOPY;  Surgeon: Quintin Buckle, DO;  Location: [facility] ENDOSCOPY;  Service: Gastroenterology;  Laterality: N/A;   COLONOSCOPY WITH PROPOFOL  N/A 02/16/2017   Procedure: COLONOSCOPY WITH PROPOFOL ;  Surgeon: Cassie Click, MD;  Location: [facility] ENDOSCOPY;  Service: Endoscopy;  Laterality: N/A;   ESOPHAGOGASTRODUODENOSCOPY N/A 07/10/2022   Procedure: ESOPHAGOGASTRODUODENOSCOPY (EGD);  Surgeon: Quintin Buckle, DO;  Location: [facility] ENDOSCOPY;  Service: Gastroenterology;  Laterality: N/A;   ESOPHAGOGASTRODUODENOSCOPY N/A 07/24/2022   Procedure: ESOPHAGOGASTRODUODENOSCOPY (EGD);  Surgeon: Quintin Buckle, DO;  Location: [facility] ENDOSCOPY;  Service: Gastroenterology;  Laterality: N/A;   ESOPHAGOGASTRODUODENOSCOPY (EGD) WITH PROPOFOL  N/A 02/16/2017   Procedure: ESOPHAGOGASTRODUODENOSCOPY (EGD) WITH PROPOFOL ;  Surgeon: Cassie Click, MD;  Location: [facility] ENDOSCOPY;  Service: Endoscopy;  Laterality: N/A;   HEMOSTASIS CLIP PLACEMENT  01/15/2023   Procedure: HEMOSTASIS CLIP PLACEMENT;  Surgeon: Quintin Buckle, DO;  Location: [facility] ENDOSCOPY;  Service: Gastroenterology;;   MASTECTOMY     POLYPECTOMY  01/15/2023   Procedure: POLYPECTOMY;  Surgeon: Polo Brisk  Bambi Lever, DO;  Location: [facility] ENDOSCOPY;  Service: Gastroenterology;;   [place] LIFTING INJECTION  01/15/2023   Procedure: SUBMUCOSAL LIFTING INJECTION;  Surgeon: Quintin Buckle, DO;  Location: [facility] ENDOSCOPY;  Service: Gastroenterology;;   WRIST SURGERY Left     SOCIAL HISTORY:   Social History   Tobacco Use   Smoking status: Never   Smokeless tobacco: Never   Substance Use Topics   Alcohol use: Yes    Comment: occasional wine    FAMILY HISTORY:   Family History  Problem Relation Age of Onset   Heart attack Father    Hypertension Other    Cancer Maternal Aunt    Breast cancer Maternal Aunt     DRUG ALLERGIES:   Allergies  Allergen Reactions   Iodinated Contrast Media Shortness Of Breath   Aspirin Tinitus and Other (See Comments)    Tinnitus   Codeine    Egg-Derived Products Other (See Comments)    On testing   Hydrochlorothiazide Other (See Comments)   Morphine And Codeine Nausea And Vomiting    REVIEW OF SYSTEMS:   ROS As per history of present illness. All pertinent systems were reviewed above. Constitutional, HEENT, cardiovascular, respiratory, GI, GU, musculoskeletal, neuro, psychiatric, endocrine, integumentary and hematologic systems were reviewed and are otherwise negative/unremarkable except for positive findings mentioned above in the HPI.   MEDICATIONS AT HOME:   Prior to Admission medications   Medication Sig Start Date End Date Taking? Authorizing Provider  carvedilol  (COREG ) 25 MG tablet Take 25 mg by mouth 2 (two) times daily with a meal.    [provider]  clopidogrel (PLAVIX) 75 MG tablet Take 75 mg by mouth daily.    [provider]  fluticasone  furoate-vilanterol (BREO ELLIPTA ) 100-25 MCG/INH AEPB Inhale 1 puff into the lungs daily.    [provider]  montelukast  (SINGULAIR ) 10 MG tablet Take 10 mg by mouth at bedtime.    [provider]  omeprazole (PRILOSEC) 40 MG capsule Take 40 mg by mouth 2 (two) times daily. 03/09/17 03/09/18  [provider]  ondansetron  (ZOFRAN  ODT) 4 MG disintegrating tablet Take 1 tablet (4 mg total) by mouth every 8 (eight) hours as needed for nausea or vomiting. 01/23/17   Ruth Cove, MD  ondansetron  (ZOFRAN  ODT) 4 MG disintegrating tablet Take 1 tablet (4 mg total) by mouth every 8 (eight) hours as needed for nausea or vomiting.  07/10/17   Jacquie Maudlin, MD  potassium chloride  20 MEQ TBCR Take 20 mEq by mouth 2 (two) times daily. 03/18/17   Lind Repine, MD  sertraline  (ZOLOFT ) 50 MG tablet Take 50 mg by mouth daily.    [provider]  simvastatin  (ZOCOR ) 20 MG tablet Take by mouth daily.     [provider]  verapamil  (CALAN -SR) 240 MG CR tablet Take 1 tablet (240 mg total) by mouth daily. 05/21/17   Altha Athens, MD      VITAL SIGNS:  Blood pressure 139/83, pulse 72, temperature 97.8 F (36.6 C), resp. rate 18, height 5\' 5"  (1.651 m), weight 73.9 kg, SpO2 99%.  PHYSICAL EXAMINATION:  Physical Exam  GENERAL:  73 y.o.-year-old Caucasian female patient lying in the bed with no acute distress.  EYES: Pupils equal, round, reactive to light and accommodation. No scleral icterus. Extraocular muscles intact.  HEENT: Head atraumatic, normocephalic. Oropharynx and nasopharynx clear.  NECK:  Supple, no jugular venous distention. No thyroid  enlargement, no tenderness.  LUNGS: Normal breath sounds bilaterally, no wheezing, rales,rhonchi or crepitation.  No use of accessory muscles of respiration.  CARDIOVASCULAR: Regular rate and rhythm, S1, S2 normal. No murmurs, rubs, or gallops.  ABDOMEN: Soft, nondistended, nontender. Bowel sounds present. No organomegaly or mass.  EXTREMITIES: No pedal edema, cyanosis, or clubbing.  NEUROLOGIC: Cranial nerves II through XII are intact. Muscle strength 5/5 in all extremities. Sensation intact. Gait not checked.  PSYCHIATRIC: The patient is alert and oriented x 3.  Normal affect and good eye contact. SKIN: No obvious rash, lesion, or ulcer.   LABORATORY PANEL:   CBC Recent Labs  Lab 11/16/23 0227  WBC 13.3*  HGB 12.6  HCT 37.9  PLT 279   ------------------------------------------------------------------------------------------------------------------  Chemistries  Recent Labs  Lab 11/15/23 1612 11/16/23 0227  NA 136 137  K 2.5* 3.5  CL 107 109   CO2 15* 13*  GLUCOSE 99 69*  BUN 14 14  CREATININE 1.22* 1.36*  CALCIUM  8.7* 8.0*  MG 2.2  --   AST 30  --   ALT 16  --   ALKPHOS 41  --   BILITOT 1.2  --    ------------------------------------------------------------------------------------------------------------------  Cardiac Enzymes No results for input(s): "TROPONINI" in the last 168 hours. ------------------------------------------------------------------------------------------------------------------  RADIOLOGY:  No results found.    IMPRESSION AND PLAN:  Assessment and Plan: * Generalized weakness - This is likely multifactorial.  It could be related to UTI as well as hypokalemia and dehydration. - Management as below.  Hypokalemia - Will replace potassium and check magnesium  level.  AKI (acute kidney injury) (HCC) - This is likely pretty renal secondary to nausea and anorexia with soft dehydration. - Will continue hydration with IV normal saline and follow BMP.  Asthma, chronic  - Will continue her inhalers and Singulair .  Depression - Will continue Zoloft .  GERD without esophagitis Will continue PPI therapy.  Essential hypertension - Will continue antihypertensive therapy.       DVT prophylaxis: Lovenox .  Advanced Care Planning:  Code Status: full code. Family Communication:  The plan of care was discussed in details with the patient (and family). I answered all questions. The patient agreed to proceed with the above mentioned plan. Further management will depend upon hospital course. Disposition Plan: Back to previous home environment Consults called: none. All the records are reviewed and case discussed with ED provider.  Status is: Observation  I certify that at the time of admission, it is my clinical judgment that the patient will require hospital care extending less than 2 midnights.                            Dispo: The patient is from: Home              Anticipated d/c is to:  Home              Patient currently is not medically stable to d/c.              Difficult to place patient: No  Virgene Griffin M.D on 11/16/2023 at 4:50 AM  [facility]   From 7 PM-7 AM, contact night-coverage [email or web address]  CC: Primary care physician; Jimmy Moulding, MD

## 2023-11-16 NOTE — Assessment & Plan Note (Signed)
-   Will replace potassium and check magnesium level.

## 2023-11-16 NOTE — Hospital Course (Addendum)
 Hospital course / significant events:   HPI: Jenna Carlson is a 73 y.o. Caucasian female with medical history significant for asthma, GERD, hypertension and dyslipidemia, presented to the emergency room with generalized weakness, worsening, assoc w/ nausea and lack of appetite. Saw PCP 11/05/23 w/ complaints of nausea and generalized weakness, reduced BP meds and got CBC, CMP, TSH --> WBC 11, Hgb 13.1, MCV 102.3, Cr 0.8 baseline, low total protein 5.9, no result for TSH, UA (+)WBC ajd 2+ protein. Also saw PCP 10/22/23 w/ complaints of positional dizziness and episodes of weakness, weight loss, noted reduced BP Rx previous visit.   05/29: admitted to hospitalist, continue on abx, await urine cultures 05/30: cultures pending, adjusting BP meds, renal fxn still abn continue IV fluids, monitor BMP   Consultants:  none  Procedures/Surgeries: none      ASSESSMENT & PLAN:   Generalized weakness likely multifactorial. Question d/t UTI, hypokalemia, dehydration. Treat underlying cause(s) as below  PT/OT    Hypokalemia (Normal magnesium ) Replace as needed Monitor BMP   Hypocalcemia  Low albumin/protein  Corrected Ca --> ***   UTI Ceftriaxone  Urine cultures pending   AKI (acute kidney injury)  Likely pre-renal secondary to nausea and anorexia w/ dehydration IV normal saline  Hold ACE/ARB follow BMP.  Proteinuria May need outpatient follow up   Essential HTN BP low here Reduced carvedilol   Holding home ACE/ARB   Asthma, chronic, not in exacerbation continue her Breo, Claritin , Singulair .   Depression Zoloft .   GERD without esophagitis PPI   Essential hypertension Home     overweight based on BMI: Body mass index is 27.12 kg/m.Aaron Aas Significantly low or high BMI is associated with higher medical risk.  Underweight - under 18  overweight - 25 to 29 obese - 30 or more Class 1 obesity: BMI of 30.0 to 34 Class 2 obesity: BMI of 35.0 to 39 Class 3 obesity: BMI  of 40.0 to 49 Super Morbid Obesity: BMI 50-59 Super-super Morbid Obesity: BMI 60+ Healthy nutrition and physical activity advised as adjunct to other disease management and risk reduction treatments    DVT prophylaxis: lovenox  IV fluids: completed continuous IV fluids  Nutrition: regular Central lines / other devices: none  Code Status: FULL CODE ACP documentation reviewed: none on file in [facility]  [facility] needs: Home health (orders are in)  Medical barriers to dispo: urine culture pending and AKI. Expected medical readiness for discharge tomorrow.

## 2023-11-17 DIAGNOSIS — R531 Weakness: Secondary | ICD-10-CM | POA: Diagnosis not present

## 2023-11-17 LAB — URINE CULTURE

## 2023-11-17 LAB — BASIC METABOLIC PANEL WITH GFR
Anion gap: 9 (ref 5–15)
BUN: 8 mg/dL (ref 8–23)
CO2: 13 mmol/L — ABNORMAL LOW (ref 22–32)
Calcium: 7.7 mg/dL — ABNORMAL LOW (ref 8.9–10.3)
Chloride: 117 mmol/L — ABNORMAL HIGH (ref 98–111)
Creatinine, Ser: 1.02 mg/dL — ABNORMAL HIGH (ref 0.44–1.00)
GFR, Estimated: 58 mL/min — ABNORMAL LOW (ref >60)
Glucose, Bld: 91 mg/dL (ref 70–99)
Potassium: 3.6 mmol/L (ref 3.5–5.1)
Sodium: 139 mmol/L (ref 135–145)

## 2023-11-17 LAB — ALBUMIN: Albumin: 2.6 g/dL — ABNORMAL LOW (ref 3.5–5.0)

## 2023-11-17 MED ORDER — POTASSIUM CHLORIDE 20 MEQ PO PACK
20.0000 meq | PACK | Freq: Every day | ORAL | Status: DC
  Administered 2023-11-17: 20 meq via ORAL
  Filled 2023-11-17: qty 1

## 2023-11-17 MED ORDER — ENSURE PLUS HIGH PROTEIN PO LIQD: 237.0000 mL | Freq: Two times a day (BID) | ORAL | Status: AC

## 2023-11-17 MED ORDER — CEPHALEXIN 500 MG PO CAPS: 500.0000 mg | ORAL_CAPSULE | Freq: Two times a day (BID) | ORAL | 0 refills | Status: AC

## 2023-11-17 NOTE — TOC Transition Note (Signed)
 Transition of Care [facility]) - Discharge Note   Patient Details  Name: Jenna Carlson MRN: [id number] Date of Birth: December 03, 1950  Transition of Care [facility]) CM/SW Contact:  Alexandra Ice, RN Phone Number: 11/17/2023, 11:30 AM   Clinical Narrative:    Patient to discharge today, home with home health services. Sent referral to Shaun with [facility], they are able to accept patient for home health services. Added agency information to AVS. No additional TOC needs identified.   Final next level of care: Home w Home Health Services Barriers to Discharge: Barriers Resolved   Patient Goals and CMS Choice Patient states their goals for this hospitalization and ongoing recovery are:: get better CMS Medicare.gov Compare Post Acute Care list provided to:: Patient Choice offered to / list presented to : Patient      Discharge Placement                  Name of family member notified: Mary Patient and family notified of of transfer: 11/17/23  Discharge Plan and Services Additional resources added to the After Visit Summary for     Discharge Planning Services: CM Consult              DME Agency: NA       HH Arranged: PT, OT HH Agency: [facility] Date HH Agency Contacted: 11/17/23 Time HH Agency Contacted: 1130 Representative spoke with at [facility] Agency: Shaun  Social Drivers of Health (SDOH) Interventions SDOH Screenings   Food Insecurity: No Food Insecurity (11/16/2023)  Housing: Low Risk  (11/16/2023)  Transportation Needs: No Transportation Needs (11/16/2023)  Utilities: Not At Risk (11/16/2023)  Financial Resource Strain: Low Risk  (09/03/2023)   Received from [facility]  Social Connections: Moderately Isolated (11/16/2023)  Tobacco Use: Low Risk  (11/15/2023)  Recent Concern: Tobacco Use - Medium Risk (11/05/2023)   Received from [facility]     Readmission Risk Interventions     No data to display

## 2023-11-17 NOTE — Plan of Care (Signed)

## 2023-11-17 NOTE — Care Management Obs Status (Signed)
 MEDICARE OBSERVATION STATUS NOTIFICATION   Patient Details  Name: RYLEA SELWAY MRN: [id number] Date of Birth: [birth year]   Medicare Observation Status Notification Given:  Yes    Anise Kerns 11/17/2023, 10:56 AM

## 2023-11-17 NOTE — Discharge Instructions (Addendum)
 [facility] They will call you to set up when they are coming out to see you   [address], [place], [place] [address] Hours:  Open ? Closes 5?PM Phone: [phone]        Instructions after Total Knee Replacement   Reinaldo Berber M.D.     Dept. of Orthopaedics & Sports Medicine  [facility]  [address]  [place], [place]  [address]  Phone: [phone]   Fax: [phone]    DIET: Drink plenty of non-alcoholic fluids. Resume your normal diet. Include foods high in fiber.  ACTIVITY:  You may use crutches or a walker with weight-bearing as tolerated, unless instructed otherwise. You may be weaned off of the walker or crutches by your Physical Therapist.  Do NOT place pillows under the knee. Anything placed under the knee could limit your ability to straighten the knee.   Continue doing gentle exercises. Exercising will reduce the pain and swelling, increase motion, and prevent muscle weakness.   Please continue to use the TED compression stockings for 2 weeks. You may remove the stockings at night, but should reapply them in the morning. Do not drive or operate any equipment until instructed.  WOUND CARE:  Continue to use the PolarCare or ice packs periodically to reduce pain and swelling. You may begin showering 3 days after surgery with honeycomb dressing. Remove honeycomb dressing 7 days after surgery and continue showering. Allow dermabond to fall off on its own.  MEDICATIONS: You may resume your regular medications. Please take the pain medication as prescribed on the medication. Do not take pain medication on an empty stomach. You have been given a prescription for a blood thinner (Lovenox or Coumadin). Please take the medication as instructed. (NOTE: After completing a 2 week course of Lovenox, take one 81 mg Enteric-coated aspirin twice a day for 3 additional weeks. This along with elevation will help reduce the possibility of phlebitis in your operated  leg.) Do not drive or drink alcoholic beverages when taking pain medications.  POSTOPERATIVE CONSTIPATION PROTOCOL Constipation - defined medically as fewer than three stools per week and severe constipation as less than one stool per week.  One of the most common issues patients have following surgery is constipation.  Even if you have a regular bowel pattern at home, your normal regimen is likely to be disrupted due to multiple reasons following surgery.  Combination of anesthesia, postoperative narcotics, change in appetite and fluid intake all can affect your bowels.  In order to avoid complications following surgery, here are some recommendations in order to help you during your recovery period.  Colace (docusate) - Pick up an over-the-counter form of Colace or another stool softener and take twice a day as long as you are requiring postoperative pain medications.  Take with a full glass of water daily.  If you experience loose stools or diarrhea, hold the colace until you stool forms back up.  If your symptoms do not get better within 1 week or if they get worse, check with your doctor.  Dulcolax (bisacodyl) - Pick up over-the-counter and take as directed by the product packaging as needed to assist with the movement of your bowels.  Take with a full glass of water.  Use this product as needed if not relieved by Colace only.   MiraLax (polyethylene glycol) - Pick up over-the-counter to have on hand.  MiraLax is a solution that will increase the amount of water in your bowels to assist with  bowel movements.  Take as directed and can mix with a glass of water, juice, soda, coffee, or tea.  Take if you go more than two days without a movement. Do not use MiraLax more than once per day. Call your doctor if you are still constipated or irregular after using this medication for 7 days in a row.  If you continue to have problems with postoperative constipation, please contact the office for further  assistance and recommendations.  If you experience "the worst abdominal pain ever" or develop nausea or vomiting, please contact the office immediatly for further recommendations for treatment.   CALL THE OFFICE FOR: Temperature above 101 degrees Excessive bleeding or drainage on the dressing. Excessive swelling, coldness, or paleness of the toes. Persistent nausea and vomiting.  FOLLOW-UP:  You should have an appointment to return to the office in 14 days after surgery. Arrangements have been made for continuation of Physical Therapy (either home therapy or outpatient therapy).

## 2023-11-17 NOTE — Discharge Summary (Signed)
 Physician Discharge Summary   Patient: Jenna Carlson MRN: [id number]  DOB: 06-30-1950   Admit:     Date of Admission: 11/15/2023 Admitted from: home   Discharge: Date of discharge: 11/17/23 Disposition: Home w/ home health Condition at discharge: good  CODE STATUS: FULL CODE     Discharge Physician: Melodi Sprung, DO [facility]     PCP: Jimmy Moulding, MD  Recommendations for Outpatient Follow-up:  Follow up with PCP Jimmy Moulding, MD in 1-2 weeks Please obtain labs/tests: BMP in 1-2 weeks    Discharge Instructions     Diet general   Complete by: As directed    Increase activity slowly   Complete by: As directed          Discharge Diagnoses: Principal Problem:   Generalized weakness Active Problems:   Acute lower UTI   Hypokalemia   AKI (acute kidney injury) (HCC)   Essential hypertension   GERD without esophagitis   Depression   Asthma, chronic    Hospital course / significant events:   HPI: Jenna Carlson is a 73 y.o. Caucasian female with medical history significant for asthma, GERD, hypertension and dyslipidemia, presented to the emergency room with generalized weakness, worsening, assoc w/ nausea and lack of appetite. Saw PCP 11/05/23 w/ complaints of nausea and generalized weakness, reduced BP meds and got CBC, CMP, TSH --> WBC 11, Hgb 13.1, MCV 102.3, Cr 0.8 baseline, low total protein 5.9, no result for TSH, UA (+)WBC ajd 2+ protein. Also saw PCP 10/22/23 w/ complaints of positional dizziness and episodes of weakness, weight loss, noted reduced BP Rx previous visit.   05/29: admitted to hospitalist, continue on abx, await urine cultures 05/30: cultures pending, adjusting BP meds, renal fxn still abn continue IV fluids, monitor BMP 05/31: pt feeling improved though still weak/tired, UCx multiple organisms, dc on keflex   Consultants:  none  Procedures/Surgeries: none      ASSESSMENT & PLAN:    Generalized weakness likely multifactorial. Question d/t UTI, hypokalemia, dehydration. Treat underlying cause(s) as below  PT/OT home health    Hypokalemia (Normal magnesium ) Replace as needed Monitor BMP   Hypocalcemia  Low albumin/protein  Corrected Ca --> 8.8 Monitor outpatient Increase caloric/protein intake    UTI UCx inconclusive Ceftriaxone  --> keflex Follow outpatient, ED precautions reviewed if worse   AKI (acute kidney injury) - resovled Likely pre-renal secondary to nausea and anorexia w/ dehydration follow BMP outpatient   Proteinuria May need outpatient follow up   Essential HTN BP low here then up Resume home meds except holding amlodipine   Asthma, chronic, not in exacerbation continue her Breo, Claritin , Singulair .   Depression Zoloft .   GERD without esophagitis PPI   Essential hypertension Home     overweight based on BMI: Body mass index is 27.12 kg/m.Aaron Aas Significantly low or high BMI is associated with higher medical risk.  Underweight - under 18  overweight - 25 to 29 obese - 30 or more Class 1 obesity: BMI of 30.0 to 34 Class 2 obesity: BMI of 35.0 to 39 Class 3 obesity: BMI of 40.0 to 49 Super Morbid Obesity: BMI 50-59 Super-super Morbid Obesity: BMI 60+ Healthy nutrition and physical activity advised as adjunct to other disease management and risk reduction treatments                   Discharge Instructions  Allergies as of 11/17/2023       Reactions   Iodinated Contrast  Media Shortness Of Breath   Aspirin Tinitus, Other (See Comments)   Tinnitus   Codeine    Egg-derived Products Other (See Comments)   On testing   Hydrochlorothiazide Other (See Comments)   Morphine And Codeine Nausea And Vomiting        Medication List     STOP taking these medications    amLODipine 2.5 MG tablet Commonly known as: NORVASC   cefUROXime 250 MG tablet Commonly known as: CEFTIN   nitrofurantoin  (macrocrystal-monohydrate) 100 MG capsule Commonly known as: MACROBID   simvastatin  20 MG tablet Commonly known as: ZOCOR    torsemide  5 MG tablet Commonly known as: DEMADEX    verapamil  240 MG CR tablet Commonly known as: CALAN -SR       TAKE these medications    acetaminophen  650 MG CR tablet Commonly known as: TYLENOL  Take 650 mg by mouth every 8 (eight) hours as needed.   albuterol  108 (90 Base) MCG/ACT inhaler Commonly known as: VENTOLIN  HFA Inhale 1 puff into the lungs every 4 (four) hours as needed.   azelastine 0.1 % nasal spray Commonly known as: ASTELIN Place into both nostrils 2 (two) times daily.   Breo Ellipta  100-25 MCG/INH Aepb Generic drug: fluticasone  furoate-vilanterol Inhale 1 puff into the lungs daily.   carvedilol  6.25 MG tablet Commonly known as: COREG  Take 6.25 mg by mouth 2 (two) times daily with a meal.   cephALEXin 500 MG capsule Commonly known as: KEFLEX Take 1 capsule (500 mg total) by mouth 2 (two) times daily for 5 days.   Claritin  10 MG tablet Generic drug: loratadine  Take 10 mg by mouth daily.   clopidogrel  75 MG tablet Commonly known as: PLAVIX  Take 75 mg by mouth daily. What changed: Another medication with the same name was removed. Continue taking this medication, and follow the directions you see here.   EPINEPHrine 0.3 mg/0.3 mL Soaj injection Commonly known as: EPI-PEN Inject 0.3 mg into the muscle as needed.   feeding supplement Liqd Take 237 mLs by mouth 2 (two) times daily between meals.   hyoscyamine 0.125 MG SL tablet Commonly known as: LEVSIN SL Take 0.125 mg by mouth every 6 (six) hours as needed.   lisinopril 20 MG tablet Commonly known as: ZESTRIL Take 20 mg by mouth daily.   montelukast  10 MG tablet Commonly known as: SINGULAIR  Take 10 mg by mouth at bedtime.   Nasacort Allergy 24HR 55 MCG/ACT Aero nasal inhaler Generic drug: triamcinolone Place 1 spray into the nose daily.   ondansetron  4 MG  disintegrating tablet Commonly known as: Zofran  ODT Take 1 tablet (4 mg total) by mouth every 8 (eight) hours as needed for nausea or vomiting.   pantoprazole  40 MG tablet Commonly known as: PROTONIX  Take 40 mg by mouth daily.   Potassium Chloride  40 MEQ/15ML (20%) Soln Use as directed 20 mEq in the mouth or throat daily.   rosuvastatin  20 MG tablet Commonly known as: CRESTOR  Take 20 mg by mouth daily.   sertraline  50 MG tablet Commonly known as: ZOLOFT  Take 50 mg by mouth daily.         Follow-up Information     Jimmy Moulding, MD Follow up.   Specialty: Internal Medicine Why: Office is closed today, patient needs to make their own hospital follow up appointment. Contact information: [address] [facility] [place] [address] [phone]                 Allergies  Allergen Reactions   Iodinated Contrast Media Shortness Of Breath   Aspirin Tinitus and Other (See Comments)    Tinnitus   Codeine    Egg-Derived Products Other (See Comments)    On testing   Hydrochlorothiazide Other (See Comments)   Morphine And Codeine Nausea And Vomiting     Subjective: pt reports still tired today but improved, ambulating ok, no CP/SOB, tolerating diet    Discharge Exam: BP (!) 153/91 (BP Location: Left Arm)   Pulse 73   Temp 98.6 F (37 C)   Resp 18   Ht 5\' 5"  (1.651 m)   Wt 73.9 kg   SpO2 99%   BMI 27.12 kg/m  General: Pt is alert, awake, not in acute distress Cardiovascular: RRR, S1/S2 +, no rubs, no gallops Respiratory: CTA bilaterally, no wheezing, no rhonchi Abdominal: Soft, NT, ND, bowel sounds + Extremities: no edema, no cyanosis     The results of significant diagnostics from this hospitalization (including imaging, microbiology, ancillary and laboratory) are listed below for reference.     Microbiology: Recent Results (from the past 240 hours)  Urine Culture     Status: Abnormal   Collection Time: 11/15/23   9:42 PM   Specimen: Urine, Random  Result Value Ref Range Status   Specimen Description   Final    URINE, RANDOM Performed at [facility], [address], [place], [place] [address]    Special Requests   Final    NONE Performed at [facility], [address] [place], [place] [address]    Culture MULTIPLE SPECIES PRESENT, SUGGEST RECOLLECTION (A)  Final   Report Status 11/17/2023 FINAL  Final     Labs: BNP (last 3 results) No results for input(s): "BNP" in the last 8760 hours. Basic Metabolic Panel: Recent Labs  Lab 11/15/23 1612 11/16/23 0227 11/17/23 0455  NA 136 137 139  K 2.5* 3.5 3.6  CL 107 109 117*  CO2 15* 13* 13*  GLUCOSE 99 69* 91  BUN 14 14 8   CREATININE 1.22* 1.36* 1.02*  CALCIUM  8.7* 8.0* 7.7*  MG 2.2  --   --    Liver Function Tests: Recent Labs  Lab 11/15/23 1612 11/17/23 0455  AST 30  --   ALT 16  --   ALKPHOS 41  --   BILITOT 1.2  --   PROT 6.6  --   ALBUMIN 3.4* 2.6*   No results for input(s): "LIPASE", "AMYLASE" in the last 168 hours. No results for input(s): "AMMONIA" in the last 168 hours. CBC: Recent Labs  Lab 11/15/23 1612 11/16/23 0227  WBC 11.9* 13.3*  HGB 13.1 12.6  HCT 39.7 37.9  MCV 99.7 100.0  PLT 369 279   Cardiac Enzymes: No results for input(s): "CKTOTAL", "CKMB", "CKMBINDEX", "TROPONINI" in the last 168 hours. BNP: Invalid input(s): "POCBNP" CBG: Recent Labs  Lab 11/16/23 1136 11/16/23 2233  GLUCAP 69* 80   D-Dimer No results for input(s): "DDIMER" in the last 72 hours. Hgb A1c No results for input(s): "HGBA1C" in the last 72 hours. Lipid Profile No results for input(s): "CHOL", "HDL", "LDLCALC", "TRIG", "CHOLHDL", "LDLDIRECT" in the last 72 hours. Thyroid  function studies Recent Labs    11/15/23 1612  TSH 2.054   Anemia work up No results for input(s): "VITAMINB12", "FOLATE", "FERRITIN", "TIBC", "IRON", "RETICCTPCT" in the last 72 hours. Urinalysis    Component Value Date/Time    COLORURINE AMBER (A) 11/15/2023 2144   APPEARANCEUR CLOUDY (A) 11/15/2023 2144  LABSPEC 1.031 (H) 11/15/2023 2144   PHURINE 5.0 11/15/2023 2144   GLUCOSEU NEGATIVE 11/15/2023 2144   HGBUR NEGATIVE 11/15/2023 2144   BILIRUBINUR SMALL (A) 11/15/2023 2144   KETONESUR 20 (A) 11/15/2023 2144   PROTEINUR >=300 (A) 11/15/2023 2144   NITRITE NEGATIVE 11/15/2023 2144   LEUKOCYTESUR MODERATE (A) 11/15/2023 2144   Sepsis Labs Recent Labs  Lab 11/15/23 1612 11/16/23 0227  WBC 11.9* 13.3*   Microbiology Recent Results (from the past 240 hours)  Urine Culture     Status: Abnormal   Collection Time: 11/15/23  9:42 PM   Specimen: Urine, Random  Result Value Ref Range Status   Specimen Description   Final    URINE, RANDOM Performed at [facility], [address], [place], [place] [address]    Special Requests   Final    NONE Performed at [facility], [address] [place], [place] [address]    Culture MULTIPLE SPECIES PRESENT, SUGGEST RECOLLECTION (A)  Final   Report Status 11/17/2023 FINAL  Final   Imaging No results found.    Time coordinating discharge: over 30 minutes  SIGNED:  Elery Cadenhead DO [facility]

## 2023-12-18 ENCOUNTER — Emergency Department

## 2023-12-18 ENCOUNTER — Other Ambulatory Visit: Payer: Self-pay

## 2023-12-18 ENCOUNTER — Observation Stay
Admission: EM | Admit: 2023-12-18 | Discharge: 2023-12-20 | Disposition: A | Discharge status: Home Health | Attending: Internal Medicine | Admitting: Internal Medicine

## 2023-12-18 DIAGNOSIS — I10 Essential (primary) hypertension: Secondary | ICD-10-CM | POA: Diagnosis not present

## 2023-12-18 DIAGNOSIS — E876 Hypokalemia: Secondary | ICD-10-CM | POA: Insufficient documentation

## 2023-12-18 DIAGNOSIS — K219 Gastro-esophageal reflux disease without esophagitis: Secondary | ICD-10-CM | POA: Diagnosis not present

## 2023-12-18 DIAGNOSIS — R112 Nausea with vomiting, unspecified: Secondary | ICD-10-CM | POA: Diagnosis present

## 2023-12-18 DIAGNOSIS — R531 Weakness: Principal | ICD-10-CM | POA: Insufficient documentation

## 2023-12-18 DIAGNOSIS — N179 Acute kidney failure, unspecified: Principal | ICD-10-CM | POA: Insufficient documentation

## 2023-12-18 DIAGNOSIS — F329 Major depressive disorder, single episode, unspecified: Secondary | ICD-10-CM | POA: Diagnosis not present

## 2023-12-18 DIAGNOSIS — E785 Hyperlipidemia, unspecified: Secondary | ICD-10-CM | POA: Insufficient documentation

## 2023-12-18 DIAGNOSIS — E860 Dehydration: Secondary | ICD-10-CM | POA: Diagnosis not present

## 2023-12-18 DIAGNOSIS — J45909 Unspecified asthma, uncomplicated: Secondary | ICD-10-CM | POA: Insufficient documentation

## 2023-12-18 LAB — URINALYSIS, COMPLETE (UACMP) WITH MICROSCOPIC
Bilirubin Urine: NEGATIVE
Glucose, UA: NEGATIVE mg/dL
Ketones, ur: 5 mg/dL — AB
Nitrite: NEGATIVE
Protein, ur: 30 mg/dL — AB
Specific Gravity, Urine: 1.004 — ABNORMAL LOW (ref 1.005–1.030)
pH: 7 (ref 5.0–8.0)

## 2023-12-18 LAB — BASIC METABOLIC PANEL WITH GFR
Anion gap: 16 — ABNORMAL HIGH (ref 5–15)
BUN: 9 mg/dL (ref 8–23)
CO2: 19 mmol/L — ABNORMAL LOW (ref 22–32)
Calcium: 8.5 mg/dL — ABNORMAL LOW (ref 8.9–10.3)
Chloride: 105 mmol/L (ref 98–111)
Creatinine, Ser: 1.34 mg/dL — ABNORMAL HIGH (ref 0.44–1.00)
GFR, Estimated: 42 mL/min — ABNORMAL LOW (ref >60)
Glucose, Bld: 100 mg/dL — ABNORMAL HIGH (ref 70–99)
Potassium: 2.7 mmol/L — CL (ref 3.5–5.1)
Sodium: 140 mmol/L (ref 135–145)

## 2023-12-18 LAB — CBC
HCT: 35.5 % — ABNORMAL LOW (ref 36.0–46.0)
Hemoglobin: 11.5 g/dL — ABNORMAL LOW (ref 12.0–15.0)
MCH: 33 pg (ref 26.0–34.0)
MCHC: 32.4 g/dL (ref 30.0–36.0)
MCV: 102 fL — ABNORMAL HIGH (ref 80.0–100.0)
Platelets: 317 10*3/uL (ref 150–400)
RBC: 3.48 MIL/uL — ABNORMAL LOW (ref 3.87–5.11)
RDW: 13.6 % (ref 11.5–15.5)
WBC: 9.2 10*3/uL (ref 4.0–10.5)
nRBC: 0 % (ref 0.0–0.2)

## 2023-12-18 LAB — TROPONIN I (HIGH SENSITIVITY)
Troponin I (High Sensitivity): 14 ng/L (ref <18)
Troponin I (High Sensitivity): 16 ng/L (ref <18)

## 2023-12-18 LAB — PROTIME-INR
INR: 0.9 (ref 0.8–1.2)
Prothrombin Time: 12.9 s (ref 11.4–15.2)

## 2023-12-18 MED ORDER — PANTOPRAZOLE SODIUM 40 MG PO TBEC
40.0000 mg | DELAYED_RELEASE_TABLET | Freq: Every day | ORAL | Status: DC
  Administered 2023-12-19 – 2023-12-20 (×2): 40 mg via ORAL
  Filled 2023-12-18 (×2): qty 1

## 2023-12-18 MED ORDER — POTASSIUM CHLORIDE 10 MEQ/100ML IV SOLN
10.0000 meq | Freq: Once | INTRAVENOUS | Status: AC
  Administered 2023-12-18: 10 meq via INTRAVENOUS
  Filled 2023-12-18: qty 100

## 2023-12-18 MED ORDER — TRIAMCINOLONE ACETONIDE 55 MCG/ACT NA AERO
1.0000 | INHALATION_SPRAY | Freq: Every day | NASAL | Status: DC
  Administered 2023-12-19 – 2023-12-20 (×2): 1 via NASAL
  Filled 2023-12-18 (×2): qty 10.8

## 2023-12-18 MED ORDER — CLOPIDOGREL BISULFATE 75 MG PO TABS
75.0000 mg | ORAL_TABLET | Freq: Every day | ORAL | Status: DC
  Administered 2023-12-19 – 2023-12-20 (×2): 75 mg via ORAL
  Filled 2023-12-18 (×2): qty 1

## 2023-12-18 MED ORDER — ACETAMINOPHEN 650 MG RE SUPP: 650.0000 mg | Freq: Four times a day (QID) | RECTAL | Status: DC | PRN

## 2023-12-18 MED ORDER — CARVEDILOL 3.125 MG PO TABS
6.2500 mg | ORAL_TABLET | Freq: Two times a day (BID) | ORAL | Status: DC
  Administered 2023-12-19 – 2023-12-20 (×3): 6.25 mg via ORAL
  Filled 2023-12-18 (×3): qty 2

## 2023-12-18 MED ORDER — SERTRALINE HCL 50 MG PO TABS
50.0000 mg | ORAL_TABLET | Freq: Every day | ORAL | Status: DC
  Administered 2023-12-19 – 2023-12-20 (×2): 50 mg via ORAL
  Filled 2023-12-18 (×2): qty 1

## 2023-12-18 MED ORDER — LACTATED RINGERS IV BOLUS
1000.0000 mL | Freq: Once | INTRAVENOUS | Status: AC
  Administered 2023-12-18: 1000 mL via INTRAVENOUS

## 2023-12-18 MED ORDER — ENOXAPARIN SODIUM 40 MG/0.4ML IJ SOSY
40.0000 mg | PREFILLED_SYRINGE | INTRAMUSCULAR | Status: DC
  Administered 2023-12-19 – 2023-12-20 (×2): 40 mg via SUBCUTANEOUS
  Filled 2023-12-18 (×2): qty 0.4

## 2023-12-18 MED ORDER — POTASSIUM CHLORIDE IN NACL 20-0.9 MEQ/L-% IV SOLN
INTRAVENOUS | Status: DC
  Filled 2023-12-18 (×4): qty 1000

## 2023-12-18 MED ORDER — TRAZODONE HCL 50 MG PO TABS: 25.0000 mg | ORAL_TABLET | Freq: Every evening | ORAL | Status: DC | PRN

## 2023-12-18 MED ORDER — ROSUVASTATIN CALCIUM 20 MG PO TABS
20.0000 mg | ORAL_TABLET | Freq: Every day | ORAL | Status: DC
  Administered 2023-12-19 – 2023-12-20 (×2): 20 mg via ORAL
  Filled 2023-12-18 (×2): qty 1

## 2023-12-18 MED ORDER — ACETAMINOPHEN 325 MG PO TABS: 650.0000 mg | ORAL_TABLET | Freq: Four times a day (QID) | ORAL | Status: DC | PRN

## 2023-12-18 MED ORDER — POTASSIUM CHLORIDE 20 MEQ PO PACK
20.0000 meq | PACK | Freq: Every day | ORAL | Status: DC
  Administered 2023-12-19 – 2023-12-20 (×2): 20 meq via ORAL
  Filled 2023-12-18 (×2): qty 1

## 2023-12-18 MED ORDER — LISINOPRIL 20 MG PO TABS
20.0000 mg | ORAL_TABLET | Freq: Every day | ORAL | Status: DC
  Administered 2023-12-19 – 2023-12-20 (×2): 20 mg via ORAL
  Filled 2023-12-18 (×2): qty 1

## 2023-12-18 MED ORDER — MONTELUKAST SODIUM 10 MG PO TABS
10.0000 mg | ORAL_TABLET | Freq: Every day | ORAL | Status: DC
  Administered 2023-12-19: 10 mg via ORAL
  Filled 2023-12-18: qty 1

## 2023-12-18 MED ORDER — POTASSIUM CHLORIDE 20 MEQ PO PACK
40.0000 meq | PACK | Freq: Once | ORAL | Status: AC
  Administered 2023-12-18: 40 meq via ORAL
  Filled 2023-12-18: qty 2

## 2023-12-18 MED ORDER — LORATADINE 10 MG PO TABS
10.0000 mg | ORAL_TABLET | Freq: Every day | ORAL | Status: DC
  Administered 2023-12-19 – 2023-12-20 (×2): 10 mg via ORAL
  Filled 2023-12-18 (×2): qty 1

## 2023-12-18 MED ORDER — ONDANSETRON HCL 4 MG/2ML IJ SOLN
4.0000 mg | Freq: Four times a day (QID) | INTRAMUSCULAR | Status: DC | PRN
  Administered 2023-12-19: 4 mg via INTRAVENOUS
  Filled 2023-12-18: qty 2

## 2023-12-18 MED ORDER — ONDANSETRON HCL 4 MG PO TABS: 4.0000 mg | ORAL_TABLET | Freq: Four times a day (QID) | ORAL | Status: DC | PRN

## 2023-12-18 MED ORDER — AZELASTINE HCL 0.1 % NA SOLN
2.0000 | Freq: Two times a day (BID) | NASAL | Status: DC
  Administered 2023-12-19 – 2023-12-20 (×3): 2 via NASAL
  Filled 2023-12-18: qty 30

## 2023-12-18 MED ORDER — MAGNESIUM HYDROXIDE 400 MG/5ML PO SUSP: 30.0000 mL | Freq: Every day | ORAL | Status: DC | PRN

## 2023-12-18 MED ORDER — ENSURE PLUS HIGH PROTEIN PO LIQD
237.0000 mL | Freq: Two times a day (BID) | ORAL | Status: DC
  Administered 2023-12-20: 237 mL via ORAL

## 2023-12-18 NOTE — ED Triage Notes (Signed)
 Pt comes in via pov with complaints of dizziness and generalized weakness for the past several days. Pt is alert and oriented x4 with no signs of acute distress at this time. Pt was recently seen in the ER at the beginning of June due to electrolyte imbalances. Pt has no complaints of chest pain or shortness of breath at this time. Pt also complains of nausea with eating.

## 2023-12-18 NOTE — ED Provider Notes (Signed)
 [facility] Provider Note    Event Date/Time   First MD Initiated Contact with Patient 12/18/23 1834     (approximate)  History   Chief Complaint: Weakness  HPI  Jenna Carlson is a 73 y.o. female with a past medical history of asthma, gastric reflux, hypertension, hyperlipidemia, presents to the emergency department for generalized weakness and nausea.  According to the patient for the last month or so she has been experiencing fairly persistent nausea.  She went to her doctor today was feeling very weak was found to have somewhat low blood pressure was told to stop her blood pressure medications.  Patient was ultimately sent home by her doctor when she got home however she was too weak to go up the stairs to get into her house.  Called EMS and they recommended she come to the emergency department.  Patient denies any chest pain or shortness of breath.  No abdominal pain.  No fever.  Patient states she has not been eating or drinking very well due to the nausea that she has been experiencing.  Patient is following up with her doctor as well as a GI doctor for this.  Physical Exam   Triage Vital Signs: ED Triage Vitals [12/18/23 1707]  Encounter Vitals Group     BP 95/76     Girls Systolic BP Percentile      Girls Diastolic BP Percentile      Boys Systolic BP Percentile      Boys Diastolic BP Percentile      Pulse Rate 81     Resp 17     Temp 98 F (36.7 C)     Temp src      SpO2 99 %     Weight 155 lb (70.3 kg)     Height 5' 5 (1.651 m)     Head Circumference      Peak Flow      Pain Score 0     Pain Loc      Pain Education      Exclude from Growth Chart     Most recent vital signs: Vitals:   12/18/23 1707 12/18/23 1828  BP: 95/76 122/87  Pulse: 81 74  Resp: 17 16  Temp: 98 F (36.7 C)   SpO2: 99% 100%    General: Awake, no distress.  CV:  Good peripheral perfusion.  Regular rate and rhythm  Resp:  Normal effort.  Equal breath sounds  bilaterally.  Abd:  No distention.  Soft, nontender.  No rebound or guarding.  ED Results / Procedures / Treatments   MEDICATIONS ORDERED IN ED: Medications  lactated ringers bolus 1,000 mL (has no administration in time range)  potassium chloride  10 mEq in 100 mL IVPB (has no administration in time range)  potassium chloride  (KLOR-CON ) packet 40 mEq (has no administration in time range)   I have reviewed interpreted chest x-ray images.  No consolidation seen on my evaluation. Radiologist read the x-ray is negative  EKG viewed and interpreted by myself shows a normal sinus rhythm at 83 bpm with a narrow QRS, normal axis, normal intervals, nonspecific ST changes.  ST changes largely unchanged from prior EKG.  IMPRESSION / MDM / ASSESSMENT AND PLAN / ED COURSE  I reviewed the triage vital signs and the nursing notes.  Patient's presentation is most consistent with acute presentation with potential threat to life or bodily function.  Patient presents emergency department for generalized weakness continued nausea.  Vital signs reassuring in the emergency department though initially on arrival somewhat low 95/76.  Patient denies any infectious symptoms.  Does states she was diagnosed with urinary tract infection several weeks ago.  Will check a urinalysis today to evaluate.  Patient CBC is reassuring with a normal white blood cell count.  Patient's chemistry however does show dehydration with anion gap of 16 mild renal insufficiency with a creatinine 1.3 and hypokalemia with a potassium of 2.7.  Will IV hydrate.  Will dose oral as well as IV potassium.  Reassuringly patient's troponin is negative.  Chest x-ray is clear.  Patient's shows a reassuring urinalysis.  Patient continues to feel weak.  Given the patient's elevated anion gap chemistry consistent with dehydration hypokalemia we will admit to the hospital service for further workup and treatment.  Patient agreeable plan  FINAL CLINICAL  IMPRESSION(S) / ED DIAGNOSES   Hypokalemia Weakness Hypotension Dehydration  Note:  This document was prepared using Dragon voice recognition software and may include unintentional dictation errors.   Dorothyann Drivers, MD 12/18/23 2318

## 2023-12-19 DIAGNOSIS — F329 Major depressive disorder, single episode, unspecified: Secondary | ICD-10-CM | POA: Insufficient documentation

## 2023-12-19 DIAGNOSIS — E785 Hyperlipidemia, unspecified: Secondary | ICD-10-CM | POA: Insufficient documentation

## 2023-12-19 DIAGNOSIS — R531 Weakness: Secondary | ICD-10-CM | POA: Diagnosis not present

## 2023-12-19 LAB — CBC
HCT: 31.5 % — ABNORMAL LOW (ref 36.0–46.0)
Hemoglobin: 10.3 g/dL — ABNORMAL LOW (ref 12.0–15.0)
MCH: 33.6 pg (ref 26.0–34.0)
MCHC: 32.7 g/dL (ref 30.0–36.0)
MCV: 102.6 fL — ABNORMAL HIGH (ref 80.0–100.0)
Platelets: 232 10*3/uL (ref 150–400)
RBC: 3.07 MIL/uL — ABNORMAL LOW (ref 3.87–5.11)
RDW: 13.3 % (ref 11.5–15.5)
WBC: 8.6 10*3/uL (ref 4.0–10.5)
nRBC: 0 % (ref 0.0–0.2)

## 2023-12-19 LAB — BASIC METABOLIC PANEL WITH GFR
Anion gap: 9 (ref 5–15)
BUN: 9 mg/dL (ref 8–23)
CO2: 19 mmol/L — ABNORMAL LOW (ref 22–32)
Calcium: 7.9 mg/dL — ABNORMAL LOW (ref 8.9–10.3)
Chloride: 110 mmol/L (ref 98–111)
Creatinine, Ser: 1.11 mg/dL — ABNORMAL HIGH (ref 0.44–1.00)
GFR, Estimated: 52 mL/min — ABNORMAL LOW (ref >60)
Glucose, Bld: 64 mg/dL — ABNORMAL LOW (ref 70–99)
Potassium: 3.3 mmol/L — ABNORMAL LOW (ref 3.5–5.1)
Sodium: 138 mmol/L (ref 135–145)

## 2023-12-19 MED ORDER — POTASSIUM CHLORIDE 20 MEQ PO PACK
40.0000 meq | PACK | Freq: Once | ORAL | Status: AC
  Administered 2023-12-19: 40 meq via ORAL
  Filled 2023-12-19: qty 2

## 2023-12-19 MED ORDER — POTASSIUM CHLORIDE IN NACL 20-0.9 MEQ/L-% IV SOLN
INTRAVENOUS | Status: DC
  Filled 2023-12-19 (×3): qty 1000

## 2023-12-19 NOTE — Plan of Care (Signed)

## 2023-12-19 NOTE — Assessment & Plan Note (Signed)
Will continue antihypertensive therapy.

## 2023-12-19 NOTE — Assessment & Plan Note (Signed)
 Will continue PPI therapy.

## 2023-12-19 NOTE — Assessment & Plan Note (Signed)
 Will continue statin therapy

## 2023-12-19 NOTE — H&P (Signed)
 [place]   PATIENT NAME: Jenna Carlson    MR#:  [id number]  DATE OF BIRTH:  05/09/1951  DATE OF ADMISSION:  12/18/2023  PRIMARY CARE PHYSICIAN: Lenon Layman ORN, MD   Patient is coming from: Home  REQUESTING/REFERRING PHYSICIAN: Dorothyann Drivers, MD  CHIEF COMPLAINT:   Chief Complaint  Patient presents with   Weakness    HISTORY OF PRESENT ILLNESS:  Jenna Carlson is a 73 y.o. Caucasian female with medical history significant for asthma, depression, GERD, hypertension and dyslipidemia, who presented to the emergency room with acute onset of generalized weakness.  Patient has been having nausea for the last month with diminished p.o. intake.  She got significantly weak that she could not climb the stairs to get to her house.  She had to be assisted in the ER to get to the stretcher by 2 people.  She admitted to the diarrhea all the time.  She has been having mild headache without dizziness or blurred vision.  No dysuria, oliguria or hematuria or flank pain.  No chest pain or palpitations.  No cough or wheezing or dyspnea.  ED Course: When she came to the ER, BP was 95/76 with otherwise normal vital signs.  Labs revealed hypokalemia of 2.7 and a CO2 of 19 with a creatinine 1.34 previously normal and calcium  8.5 with anion gap of 16.  High-sensitivity troponin I was 14 and later 16.  CBC showed hemoglobin 11.5 hematocrit 35.5 with macrocytosis.  UA was positive for UTI. EKG as reviewed by me :  EKG showed normal sinus rhythm with a rate of 83 and T wave inversion laterally and inferiorly. Imaging: 2 view chest x-ray showed no acute cardiopulmonary disease.  The patient was given 1 L bolus of IV lactated ringer, 40 mill colons p.o. potassium chloride  and 10 mill equivalent IV potassium chloride .  She will be admitted to the medical telemetry observation bed for further evaluation and management. PAST MEDICAL HISTORY:   Past Medical History:  Diagnosis Date   Allergic  rhinitis    Asthma    Breast cancer (HCC) 1990's   right breast   Cancer (HCC)    BREAST   Depression    Edema    GERD (gastroesophageal reflux disease)    Hyperlipidemia    Hypertension    Osteoporosis, postmenopausal    Personal history of chemotherapy     PAST SURGICAL HISTORY:   Past Surgical History:  Procedure Laterality Date   COLONOSCOPY N/A 07/10/2022   Procedure: COLONOSCOPY;  Surgeon: Onita Elspeth Sharper, DO;  Location: [facility] ENDOSCOPY;  Service: Gastroenterology;  Laterality: N/A;   COLONOSCOPY N/A 01/15/2023   Procedure: COLONOSCOPY;  Surgeon: Onita Elspeth Sharper, DO;  Location: [facility] ENDOSCOPY;  Service: Gastroenterology;  Laterality: N/A;   COLONOSCOPY WITH PROPOFOL  N/A 02/16/2017   Procedure: COLONOSCOPY WITH PROPOFOL ;  Surgeon: Viktoria Lamar DASEN, MD;  Location: [facility] ENDOSCOPY;  Service: Endoscopy;  Laterality: N/A;   ESOPHAGOGASTRODUODENOSCOPY N/A 07/10/2022   Procedure: ESOPHAGOGASTRODUODENOSCOPY (EGD);  Surgeon: Onita Elspeth Sharper, DO;  Location: [facility] ENDOSCOPY;  Service: Gastroenterology;  Laterality: N/A;   ESOPHAGOGASTRODUODENOSCOPY N/A 07/24/2022   Procedure: ESOPHAGOGASTRODUODENOSCOPY (EGD);  Surgeon: Onita Elspeth Sharper, DO;  Location: [facility] ENDOSCOPY;  Service: Gastroenterology;  Laterality: N/A;   ESOPHAGOGASTRODUODENOSCOPY (EGD) WITH PROPOFOL  N/A 02/16/2017   Procedure: ESOPHAGOGASTRODUODENOSCOPY (EGD) WITH PROPOFOL ;  Surgeon: Viktoria Lamar DASEN, MD;  Location: [facility] ENDOSCOPY;  Service: Endoscopy;  Laterality: N/A;   HEMOSTASIS CLIP PLACEMENT  01/15/2023   Procedure: HEMOSTASIS CLIP  PLACEMENT;  Surgeon: Onita Elspeth Sharper, DO;  Location: [facility] ENDOSCOPY;  Service: Gastroenterology;;   MASTECTOMY     POLYPECTOMY  01/15/2023   Procedure: POLYPECTOMY;  Surgeon: Onita Elspeth Sharper, DO;  Location: [facility] ENDOSCOPY;  Service: Gastroenterology;;   ROBLEY MEYER INJECTION  01/15/2023   Procedure: SUBMUCOSAL LIFTING INJECTION;  Surgeon: Onita Elspeth Sharper, DO;  Location: [facility] ENDOSCOPY;  Service: Gastroenterology;;   WRIST SURGERY Left     SOCIAL HISTORY:   Social History   Tobacco Use   Smoking status: Never   Smokeless tobacco: Never  Substance Use Topics   Alcohol use: Yes    Comment: occasional wine    FAMILY HISTORY:   Family History  Problem Relation Age of Onset   Heart attack Father    Hypertension Other    Cancer Maternal Aunt    Breast cancer Maternal Aunt     DRUG ALLERGIES:   Allergies  Allergen Reactions   Iodinated Contrast Media Shortness Of Breath   Aspirin Tinitus and Other (See Comments)    Tinnitus   Codeine    Egg-Derived Products Other (See Comments)    On testing   Hydrochlorothiazide Other (See Comments)   Morphine And Codeine Nausea And Vomiting    REVIEW OF SYSTEMS:   ROS As per history of present illness. All pertinent systems were reviewed above. Constitutional, HEENT, cardiovascular, respiratory, GI, GU, musculoskeletal, neuro, psychiatric, endocrine, integumentary and hematologic systems were reviewed and are otherwise negative/unremarkable except for positive findings mentioned above in the HPI.   MEDICATIONS AT HOME:   Prior to Admission medications   Medication Sig Start Date End Date Taking? Authorizing Provider  acetaminophen  (TYLENOL ) 650 MG CR tablet Take 650 mg by mouth every 8 (eight) hours as needed.   Yes [provider]  albuterol  (VENTOLIN  HFA) 108 (90 Base) MCG/ACT inhaler Inhale 1 puff into the lungs every 4 (four) hours as needed. 02/04/18  Yes [provider]  ARIPiprazole (ABILIFY) 2 MG tablet Take 2 mg by mouth daily. 12/04/23 04/02/24 Yes [provider]  ARIPiprazole (ABILIFY) 5 MG tablet Take 5 mg by mouth daily. 12/18/23 04/16/24 Yes [provider]  azelastine (ASTELIN) 0.1 % nasal spray Place into both nostrils 2 (two) times daily.   Yes [provider]  carvedilol  (COREG ) 6.25 MG tablet Take 6.25 mg by mouth 2  (two) times daily with a meal. 11/05/23 11/04/24 Yes [provider]  clopidogrel  (PLAVIX ) 75 MG tablet Take 75 mg by mouth daily.   Yes [provider]  EPINEPHrine 0.3 mg/0.3 mL IJ SOAJ injection Inject 0.3 mg into the muscle as needed.   Yes [provider]  fluticasone  furoate-vilanterol (BREO ELLIPTA ) 100-25 MCG/INH AEPB Inhale 1 puff into the lungs daily.   Yes [provider]  hyoscyamine (LEVSIN SL) 0.125 MG SL tablet Take 0.125 mg by mouth every 6 (six) hours as needed. 11/29/22  Yes [provider]  lisinopril (ZESTRIL) 20 MG tablet Take 20 mg by mouth daily. 09/13/23 09/12/24 Yes [provider]  loratadine  (CLARITIN ) 10 MG tablet Take 10 mg by mouth daily.   Yes [provider]  montelukast  (SINGULAIR ) 10 MG tablet Take 10 mg by mouth at bedtime.   Yes [provider]  ondansetron  (ZOFRAN  ODT) 4 MG disintegrating tablet Take 1 tablet (4 mg total) by mouth every 8 (eight) hours as needed for nausea or vomiting. 01/23/17  Yes Dorothyann Drivers, MD  pantoprazole  (PROTONIX ) 40 MG tablet Take 40 mg by  mouth daily. 07/25/23 07/24/24 Yes [provider]  Potassium Chloride  40 MEQ/15ML (20%) SOLN Use as directed 20 mEq in the mouth or throat daily. 08/02/23  Yes [provider]  QUEtiapine (SEROQUEL) 25 MG tablet Take 25 mg by mouth at bedtime. 11/26/23  Yes [provider]  rosuvastatin  (CRESTOR ) 20 MG tablet Take 20 mg by mouth daily. 05/31/23 05/30/24 Yes [provider]  sertraline  (ZOLOFT ) 50 MG tablet Take 50 mg by mouth daily.   Yes [provider]  triamcinolone (NASACORT ALLERGY 24HR) 55 MCG/ACT AERO nasal inhaler Place 1 spray into the nose daily.   Yes [provider]  feeding supplement (ENSURE PLUS HIGH PROTEIN) LIQD Take 237 mLs by mouth 2 (two) times daily between meals. 11/17/23   Alexander, Natalie, DO      VITAL SIGNS:  Blood pressure (!) 163/95, pulse 64,  temperature 97.7 F (36.5 C), resp. rate 17, height 5' 5 (1.651 m), weight 70.3 kg, SpO2 98%.  PHYSICAL EXAMINATION:  Physical Exam  GENERAL:  73 y.o.-year-old patient lying in the bed with no acute distress.  EYES: Pupils equal, round, reactive to light and accommodation. No scleral icterus. Extraocular muscles intact.  HEENT: Head atraumatic, normocephalic. Oropharynx and nasopharynx clear.  NECK:  Supple, no jugular venous distention. No thyroid  enlargement, no tenderness.  LUNGS: Normal breath sounds bilaterally, no wheezing, rales,rhonchi or crepitation. No use of accessory muscles of respiration.  CARDIOVASCULAR: Regular rate and rhythm, S1, S2 normal. No murmurs, rubs, or gallops.  ABDOMEN: Soft, nondistended, nontender. Bowel sounds present. No organomegaly or mass.  EXTREMITIES: No pedal edema, cyanosis, or clubbing.  NEUROLOGIC: Cranial nerves II through XII are intact. Muscle strength 5/5 in all extremities. Sensation intact. Gait not checked.  PSYCHIATRIC: The patient is alert and oriented x 3.  Normal affect and good eye contact. SKIN: No obvious rash, lesion, or ulcer.   LABORATORY PANEL:   CBC Recent Labs  Lab 12/19/23 0514  WBC 8.6  HGB 10.3*  HCT 31.5*  PLT 232   ------------------------------------------------------------------------------------------------------------------  Chemistries  Recent Labs  Lab 12/18/23 1711  NA 140  K 2.7*  CL 105  CO2 19*  GLUCOSE 100*  BUN 9  CREATININE 1.34*  CALCIUM  8.5*   ------------------------------------------------------------------------------------------------------------------  Cardiac Enzymes No results for input(s): TROPONINI in the last 168 hours. ------------------------------------------------------------------------------------------------------------------  RADIOLOGY:  DG Chest 2 View Result Date: 12/18/2023 CLINICAL DATA:  Weakness EXAM: CHEST - 2 VIEW COMPARISON:  Chest CT 12/15/2022 FINDINGS:  The heart size and mediastinal contours are within normal limits. Both lungs are clear. The visualized skeletal structures are unremarkable. Clips in the right axilla. IMPRESSION: No active cardiopulmonary disease. Electronically Signed   By: Luke Bun M.D.   On: 12/18/2023 17:48      IMPRESSION AND PLAN:  Assessment and Plan: * Generalized weakness - This is secondary to dehydration, AKI and hypokalemia. - The patient will be admitted to a medical telemetry observation bed. - Management otherwise as below. - PT consult will be obtained.  AKI (acute kidney injury) (HCC) - This is likely prerenal due to volume depletion and dehydration with associated anorexia, nausea and diarrhea.  Hypokalemia - Potassium will be replaced and magnesium  level will be checked.  Major depression - Will continue Zoloft , Seroquel and Abilify.  Dyslipidemia - Will continue statin therapy.  GERD without esophagitis - Will continue PPI therapy.  Essential hypertension - Will continue antihypertensive therapy.   DVT prophylaxis: Lovenox . Advanced Care Planning:  Code Status: The patient  is DNR only. Family Communication:  The plan of care was discussed in details with the patient (and family). I answered all questions. The patient agreed to proceed with the above mentioned plan. Further management will depend upon hospital course. Disposition Plan: Back to previous home environment Consults called: none. All the records are reviewed and case discussed with ED provider.  Status is: Observation  I certify that at the time of admission, it is my clinical judgment that the patient will require hospital care extending less than 2 midnights.                            Dispo: The patient is from: Home              Anticipated d/c is to: Home              Patient currently is not medically stable to d/c.              Difficult to place patient: No  Madison DELENA Peaches M.D on 12/19/2023 at 6:00 AM  [facility]   From 7 PM-7 AM, contact night-coverage [email or web address]  CC: Primary care physician; Lenon Layman ORN, MD

## 2023-12-19 NOTE — Assessment & Plan Note (Signed)
-   This is secondary to dehydration, AKI and hypokalemia. - The patient will be admitted to a medical telemetry observation bed. - Management otherwise as below. - PT consult will be obtained.

## 2023-12-19 NOTE — Assessment & Plan Note (Signed)
-   Potassium will be replaced and magnesium level will be checked. ?

## 2023-12-19 NOTE — TOC Initial Note (Signed)
 Transition of Care [facility]) - Initial/Assessment Note    Patient Details  Name: Jenna Carlson MRN: [id number] Date of Birth: 12-17-1950  [facility] CM/SW Contact:    Quintella Suzen Jansky, RN Phone Number: 12/19/2023, 3:20 PM  Clinical Narrative:                  Met with patient at bedside. She is agreeable to home health, she is current with [facility]. Sent message to [place] with [facility] notifying of admission. TOC will continue to follow for any additional discharge needs.   Expected Discharge Plan: Home w Home Health Services Barriers to Discharge: Continued Medical Work up   Patient Goals and CMS Choice Patient states their goals for this hospitalization and ongoing recovery are:: go home CMS Medicare.gov Compare Post Acute Care list provided to:: Patient Choice offered to / list presented to : Patient      Expected Discharge Plan and Services     Post Acute Care Choice: Home Health Living arrangements for the past 2 months: Single Family Home                   DME Agency: NA       HH Arranged: PT, OT, RN, Nurse's Aide HH Agency: [facility] Date HH Agency Contacted: 12/19/23 Time HH Agency Contacted: 1519 Representative spoke with at [facility] Agency: Shaun  Prior Living Arrangements/Services Living arrangements for the past 2 months: Single Family Home Lives with:: Self Patient language and need for interpreter reviewed:: Yes Do you feel safe going back to the place where you live?: Yes      Need for Family Participation in Patient Care: No (Comment) Care giver support system in place?: Yes (comment) Current home services: DME, Home PT, Home OT, Home RN, Homehealth aide Criminal Activity/Legal Involvement Pertinent to Current Situation/Hospitalization: No - Comment as needed  Activities of Daily Living   ADL Screening (condition at time of admission) Independently performs ADLs?: Yes (appropriate for developmental age) Is the  patient deaf or have difficulty hearing?: No Does the patient have difficulty seeing, even when wearing glasses/contacts?: No Does the patient have difficulty concentrating, remembering, or making decisions?: No  Permission Sought/Granted                  Emotional Assessment Appearance:: Appears stated age Attitude/Demeanor/Rapport: Engaged Affect (typically observed): Appropriate Orientation: : Oriented to Self, Oriented to Place, Oriented to  Time, Oriented to Situation Alcohol / Substance Use: Not Applicable Psych Involvement: No (comment)  Admission diagnosis:  Dehydration [E86.0] Hypokalemia [E87.6] Weakness [R53.1] Generalized weakness [R53.1] Patient Active Problem List   Diagnosis Date Noted   Dyslipidemia 12/19/2023   Major depression 12/19/2023   Acute lower UTI 11/16/2023   AKI (acute kidney injury) (HCC) 11/16/2023   Essential hypertension 11/16/2023   GERD without esophagitis 11/16/2023   Depression 11/16/2023   Asthma, chronic 11/16/2023   Generalized weakness 11/15/2023   Elevated gastrin level 06/01/2017   Diarrhea    Weakness 05/16/2017   Hypokalemia 05/16/2017   Multiple duodenal ulcers 05/16/2017   HTN (hypertension) 05/16/2017   HLD (hyperlipidemia) 05/16/2017   PCP:  Lenon Layman ORN, MD Pharmacy:   [facility] - [place], [place] - [address] [place] [address] Phone: [phone] Fax: [phone]     Social Drivers of Health (SDOH) Social History: SDOH Screenings   Food Insecurity: No Food Insecurity (12/19/2023)  Housing: Low Risk  (  12/19/2023)  Transportation Needs: No Transportation Needs (12/19/2023)  Utilities: Not At Risk (12/19/2023)  Financial Resource Strain: Low Risk  (12/18/2023)   Received from [facility]  Social Connections: Moderately Isolated (12/19/2023)  Tobacco Use: Low Risk  (12/18/2023)  Recent Concern: Tobacco Use - Medium Risk (12/18/2023)   Received from [facility]   SDOH Interventions:     Readmission Risk Interventions     No data to display

## 2023-12-19 NOTE — Assessment & Plan Note (Signed)
-   This is likely prerenal due to volume depletion and dehydration with associated anorexia, nausea and diarrhea.

## 2023-12-19 NOTE — Assessment & Plan Note (Signed)
-   Will continue Zoloft , Seroquel and Abilify.

## 2023-12-19 NOTE — Evaluation (Signed)
 Physical Therapy Evaluation Patient Details Name: Jenna Carlson MRN: [id number] DOB: Jan 05, 1951 Today's Date: 12/19/2023  History of Present Illness  Ananya ROZALYNN BUEGE is a 73 y.o. Caucasian female with medical history significant for asthma, depression, GERD, hypertension and dyslipidemia, who presented to the emergency room with acute onset of generalized weakness.  Clinical Impression  Pt is a pleasant 73 year old female who was admitted for generalized weakness. Pt performs bed mobility with CGA, increased time to initiate movement with minimal verbal cueing, x1 UE support. STS transfer performed with CGA and use of RW, no LOB experienced. Ambulation successful with RW, x200', pt demo decreased step length and width but maintains adequate step through pattern. Pt demo overall deficits with strength and balance that impact her QoL. Pt will benefit from skilled PT interventions to meet therapy goals and improve overall wellbeing. PT to follow acutely as appropriate.      If plan is discharge home, recommend the following: A little help with walking and/or transfers;A little help with bathing/dressing/bathroom;Assist for transportation;Help with stairs or ramp for entrance   Can travel by private vehicle        Equipment Recommendations None recommended by PT  Recommendations for Other Services       Functional Status Assessment Patient has had a recent decline in their functional status and demonstrates the ability to make significant improvements in function in a reasonable and predictable amount of time.     Precautions / Restrictions Precautions Precautions: Fall Recall of Precautions/Restrictions: Intact Restrictions Weight Bearing Restrictions Per Provider Order: No      Mobility  Bed Mobility Overal bed mobility: Needs Assistance Bed Mobility: Supine to Sit     Supine to sit: Contact guard     General bed mobility comments: CGA for bed mobility, x1 UE support,  increased time to initiate movement    Transfers Overall transfer level: Needs assistance Equipment used: Rolling walker (2 wheels) Transfers: Sit to/from Stand Sit to Stand: Contact guard assist           General transfer comment: CGA for STS, initial verbal cueing on placement of UE, no LOB experienced    Ambulation/Gait Ambulation/Gait assistance: Contact guard assist Gait Distance (Feet): 200 Feet Assistive device: Rolling walker (2 wheels) Gait Pattern/deviations: Step-through pattern, Decreased step length - right, Decreased step length - left       General Gait Details: step through pattern, use of RW, no LOB experienced, pt stating she feels generally weak  Stairs            Wheelchair Mobility     Tilt Bed    Modified Rankin (Stroke Patients Only)       Balance Overall balance assessment: Needs assistance Sitting-balance support: Single extremity supported, Feet supported Sitting balance-Leahy Scale: Fair Sitting balance - Comments: sitting EOB   Standing balance support: Bilateral upper extremity supported Standing balance-Leahy Scale: Fair Standing balance comment: use of RW                             Pertinent Vitals/Pain Pain Assessment Pain Assessment: No/denies pain    Home Living Family/patient expects to be discharged to:: Private residence Living Arrangements: Alone Available Help at Discharge: Family;Friend(s) Type of Home: House Home Access: Stairs to enter Entrance Stairs-Rails: Can reach both Entrance Stairs-Number of Steps: 5   Home Layout: Two level;Able to live on main level with bedroom/bathroom Home Equipment: Toilet riser;Shower Counsellor (2  wheels);Rollator (4 wheels);Cane - single point;Hand held shower head Additional Comments: has an aide come in x1/wk to help her shower, currently has [facility] PT/OT come x2/wk    Prior Function Prior Level of Function : Independent/Modified Independent              Mobility Comments: friends assist with driving for errands/groceries lately. ambulatory with RW especially the last couple of weeks; uses [facility] to get to her car for appts and then transport/WC for appts ADLs Comments: independent, has meds delivered     Extremity/Trunk Assessment   Upper Extremity Assessment Upper Extremity Assessment: Generalized weakness    Lower Extremity Assessment Lower Extremity Assessment: Generalized weakness    Cervical / Trunk Assessment Cervical / Trunk Assessment: Kyphotic  Communication   Communication Communication: No apparent difficulties    Cognition Arousal: Alert Behavior During Therapy: WFL for tasks assessed/performed   PT - Cognitive impairments: No apparent impairments                         Following commands: Intact       Cueing Cueing Techniques: Verbal cues, Tactile cues     General Comments      Exercises     Assessment/Plan    PT Assessment Patient needs continued PT services  PT Problem List Decreased strength;Decreased activity tolerance;Decreased balance;Decreased mobility;Decreased knowledge of use of DME;Decreased safety awareness       PT Treatment Interventions DME instruction;Gait training;Stair training;Functional mobility training;Therapeutic activities;Therapeutic exercise;Balance training;Patient/family education    PT Goals (Current goals can be found in the Care Plan section)  Acute Rehab PT Goals Patient Stated Goal: to get stronger PT Goal Formulation: With patient Time For Goal Achievement: 01/02/24 Potential to Achieve Goals: Good    Frequency Min 2X/week     Co-evaluation               AM-PAC PT 6 Clicks Mobility  Outcome Measure Help needed turning from your back to your side while in a flat bed without using bedrails?: A Little Help needed moving from lying on your back to sitting on the side of a flat bed without using bedrails?: A Little Help needed moving to  and from a bed to a chair (including a wheelchair)?: A Little Help needed standing up from a chair using your arms (e.g., wheelchair or bedside chair)?: A Little Help needed to walk in hospital room?: A Little Help needed climbing 3-5 steps with a railing? : A Little 6 Click Score: 18    End of Session Equipment Utilized During Treatment: Gait belt Activity Tolerance: Patient tolerated treatment well Patient left: in chair;with call bell/phone within reach;with family/visitor present Nurse Communication: Mobility status PT Visit Diagnosis: Unsteadiness on feet (R26.81);Other abnormalities of gait and mobility (R26.89);Muscle weakness (generalized) (M62.81)    Time: 8962-8942 PT Time Calculation (min) (ACUTE ONLY): 20 min   Charges:                   Makhiya Coburn Romero-Perozo, SPT  12/19/2023, 11:52 AM

## 2023-12-19 NOTE — Progress Notes (Signed)
 Progress Note   Patient: Jenna Carlson FMW:[id number] DOB: May 31, 1951 DOA: 12/18/2023     0 DOS: the patient was seen and examined on 12/19/2023   Brief hospital course: Jenna Carlson is a 73 y.o. Caucasian female with medical history significant for asthma, depression, GERD, hypertension and dyslipidemia, who presented to the emergency room with acute onset of generalized weakness.  Assessment and Plan:  Generalized weakness - This is secondary to dehydration, AKI and hypokalemia. - The patient will be admitted to a medical telemetry observation bed. - Management otherwise as below. PT OT on board   AKI (acute kidney injury) (HCC) - This is likely prerenal due to volume depletion and dehydration with associated anorexia, nausea and diarrhea. Continue IV fluid   Hypokalemia Continue potassium replacement and monitoring   Major depression - Will continue Zoloft , Seroquel and Abilify.   Dyslipidemia - Will continue statin therapy.   GERD without esophagitis - Will continue PPI therapy.   Essential hypertension - Will continue antihypertensive therapy.     DVT prophylaxis: Lovenox .  Advanced Care Planning:  Code Status: The patient is DNR only.  Family Communication:  The plan of care was discussed in details with the patient (and family).  Disposition Plan: Back to previous home environment  Consults called: none.    Subjective:  Patient still feeling generally weak Appears dehydrated currently on IV fluid Denies nausea vomiting abdominal pain chest pain  Physical Exam:  GENERAL:  73 y.o.-year-old patient lying in the bed with no acute distress.  EYES: Pupils equal, round, reactive to light and accommodation. No scleral icterus. Extraocular muscles intact.  HEENT: Head atraumatic, normocephalic. Oropharynx and nasopharynx clear.  NECK:  Supple, no jugular venous distention. No thyroid  enlargement, no tenderness.  LUNGS: Normal breath sounds bilaterally, no  wheezing, rales,rhonchi or crepitation. No use of accessory muscles of respiration.  CARDIOVASCULAR: Regular rate and rhythm, S1, S2 normal. No murmurs, rubs, or gallops.  ABDOMEN: Soft, nondistended, nontender. Bowel sounds present. No organomegaly or mass.  EXTREMITIES: No pedal edema, cyanosis, or clubbing.  NEUROLOGIC: Cranial nerves II through XII are intact. Muscle strength 5/5 in all extremities. Sensation intact. Gait not checked.  PSYCHIATRIC: The patient is alert and oriented x 3.  Normal affect and good eye contact. SKIN: No obvious rash, lesion, or ulcer.  Vitals:   12/19/23 0205 12/19/23 0841 12/19/23 1436 12/19/23 1623  BP: (!) 163/95 130/71 101/85 125/69  Pulse: 64 70 65 67  Resp:  16 16   Temp: 97.7 F (36.5 C) 97.8 F (36.6 C) 97.7 F (36.5 C)   TempSrc:  Oral Oral   SpO2: 98% 97% 99% 99%  Weight:      Height:        Data Reviewed:  Chest x-ray reviewed did not show any acute intra pulmonary pathology    Latest Ref Rng & Units 12/19/2023    5:14 AM 12/18/2023    5:11 PM 11/16/2023    2:27 AM  CBC  WBC 4.0 - 10.5 K/uL 8.6  9.2  13.3   Hemoglobin 12.0 - 15.0 g/dL 89.6  88.4  87.3   Hematocrit 36.0 - 46.0 % 31.5  35.5  37.9   Platelets 150 - 400 K/uL 232  317  279        Latest Ref Rng & Units 12/19/2023    5:14 AM 12/18/2023    5:11 PM 11/17/2023    4:55 AM  BMP  Glucose 70 - 99 mg/dL 64  899  91   BUN 8 - 23 mg/dL 9  9  8    Creatinine 0.44 - 1.00 mg/dL 8.88  8.65  8.97   Sodium 135 - 145 mmol/L 138  140  139   Potassium 3.5 - 5.1 mmol/L 3.3  2.7  3.6   Chloride 98 - 111 mmol/L 110  105  117   CO2 22 - 32 mmol/L 19  19  13    Calcium  8.9 - 10.3 mg/dL 7.9  8.5  7.7       Family Communication: None at bedside  Disposition: Home when medically stable  Time spent: 52 minutes  Author: Drue ONEIDA Potter, MD 12/19/2023 5:16 PM  For on call review [email or web address].

## 2023-12-20 DIAGNOSIS — R531 Weakness: Secondary | ICD-10-CM | POA: Diagnosis not present

## 2023-12-20 LAB — CBC WITH DIFFERENTIAL/PLATELET
Abs Immature Granulocytes: 0.06 10*3/uL (ref 0.00–0.07)
Basophils Absolute: 0.1 10*3/uL (ref 0.0–0.1)
Basophils Relative: 1 %
Eosinophils Absolute: 0.5 10*3/uL (ref 0.0–0.5)
Eosinophils Relative: 7 %
HCT: 27.9 % — ABNORMAL LOW (ref 36.0–46.0)
Hemoglobin: 9.2 g/dL — ABNORMAL LOW (ref 12.0–15.0)
Immature Granulocytes: 1 %
Lymphocytes Relative: 26 %
Lymphs Abs: 2.2 10*3/uL (ref 0.7–4.0)
MCH: 33.8 pg (ref 26.0–34.0)
MCHC: 33 g/dL (ref 30.0–36.0)
MCV: 102.6 fL — ABNORMAL HIGH (ref 80.0–100.0)
Monocytes Absolute: 0.9 10*3/uL (ref 0.1–1.0)
Monocytes Relative: 10 %
Neutro Abs: 4.5 10*3/uL (ref 1.7–7.7)
Neutrophils Relative %: 55 %
Platelets: 217 10*3/uL (ref 150–400)
RBC: 2.72 MIL/uL — ABNORMAL LOW (ref 3.87–5.11)
RDW: 13.7 % (ref 11.5–15.5)
WBC: 8.2 10*3/uL (ref 4.0–10.5)
nRBC: 0 % (ref 0.0–0.2)

## 2023-12-20 LAB — BASIC METABOLIC PANEL WITH GFR
Anion gap: 8 (ref 5–15)
BUN: 6 mg/dL — ABNORMAL LOW (ref 8–23)
CO2: 18 mmol/L — ABNORMAL LOW (ref 22–32)
Calcium: 7.7 mg/dL — ABNORMAL LOW (ref 8.9–10.3)
Chloride: 114 mmol/L — ABNORMAL HIGH (ref 98–111)
Creatinine, Ser: 1.11 mg/dL — ABNORMAL HIGH (ref 0.44–1.00)
GFR, Estimated: 52 mL/min — ABNORMAL LOW (ref >60)
Glucose, Bld: 81 mg/dL (ref 70–99)
Potassium: 3.6 mmol/L (ref 3.5–5.1)
Sodium: 140 mmol/L (ref 135–145)

## 2023-12-20 NOTE — Progress Notes (Signed)
 Physical Therapy Treatment Patient Details Name: Jenna Carlson MRN: [id number] DOB: 17-Sep-1950 Today's Date: 12/20/2023   History of Present Illness Jenna Carlson is a 73 y.o. Caucasian female with medical history significant for asthma, depression, GERD, hypertension and dyslipidemia, who presented to the emergency room with acute onset of generalized weakness.    PT Comments  Pt was long sitting in bed upon arrival. She is A and O x 4 and endorsing feeling much better than previous two days. Pt demonstrated safe abilities to exit bed, stand to RW, and tolerate ambulation ~ 250 ft total. She performed ascending/descending stairs to simulate home entry without difficulty. Pt requesting to have EMS transport home from hospital. Endorses willingness to private pay if unable to have transport covered. Author recommends continued skilled PT at DC to maximize her independence, strength, and safety with all ADLs.     If plan is discharge home, recommend the following: A little help with walking and/or transfers;A little help with bathing/dressing/bathroom;Assist for transportation;Help with stairs or ramp for entrance     Equipment Recommendations  None recommended by PT       Precautions / Restrictions Precautions Precautions: Fall Recall of Precautions/Restrictions: Intact Restrictions Weight Bearing Restrictions Per Provider Order: No     Mobility  Bed Mobility Overal bed mobility: Modified Independent Bed Mobility: Supine to Sit, Sit to Supine  Supine to sit: Modified independent (Device/Increase time) Sit to supine: Modified independent (Device/Increase time) General bed mobility comments: no physical assistance required to exit bed or to return to bed after OOB activity    Transfers Overall transfer level: Needs assistance Equipment used: Rolling walker (2 wheels) Transfers: Sit to/from Stand Sit to Stand: Supervision  General transfer comment: no physical assistance required  to stand from EOB or from rollator surface after performing stairs    Ambulation/Gait Ambulation/Gait assistance: Supervision Gait Distance (Feet): 250 Feet Assistive device: Rolling walker (2 wheels) Gait Pattern/deviations: Step-through pattern Gait velocity: decreased  General Gait Details: no LOB or safety concern during ambulatioon with RW. pt did ambulate short distance without AD however author encouraged pt to use RW until full strength returns   Stairs Stairs: Yes Stairs assistance: Supervision Stair Management: Two rails, Step to pattern, Forwards Number of Stairs: 4 General stair comments: Pt demonstrated safe performance of ascending/descending stairs with BUE support. step to pattern    Balance Overall balance assessment: Needs assistance Sitting-balance support: Feet supported Sitting balance-Leahy Scale: Good     Standing balance support: Bilateral upper extremity supported, During functional activity Standing balance-Leahy Scale: Good         Communication Communication Communication: No apparent difficulties  Cognition Arousal: Alert Behavior During Therapy: WFL for tasks assessed/performed   PT - Cognitive impairments: No apparent impairments      PT - Cognition Comments: Pt is A and O x 4. pleasant and cooperative throughout. Following commands: Intact      Cueing Cueing Techniques: Verbal cues, Tactile cues         Pertinent Vitals/Pain Pain Assessment Pain Assessment: No/denies pain     PT Goals (current goals can now be found in the care plan section) Acute Rehab PT Goals Patient Stated Goal: to get stronger Progress towards PT goals: Progressing toward goals    Frequency    Min 2X/week       AM-PAC PT 6 Clicks Mobility   Outcome Measure  Help needed turning from your back to your side while in a flat bed without using bedrails?:  A Little Help needed moving from lying on your back to sitting on the side of a flat bed  without using bedrails?: A Little Help needed moving to and from a bed to a chair (including a wheelchair)?: A Little Help needed standing up from a chair using your arms (e.g., wheelchair or bedside chair)?: A Little Help needed to walk in hospital room?: A Little Help needed climbing 3-5 steps with a railing? : A Little 6 Click Score: 18    End of Session   Activity Tolerance: Patient tolerated treatment well Patient left: in bed;with call bell/phone within reach;with bed alarm set Nurse Communication: Mobility status PT Visit Diagnosis: Unsteadiness on feet (R26.81);Other abnormalities of gait and mobility (R26.89);Muscle weakness (generalized) (M62.81)     Time: 9277-9252 PT Time Calculation (min) (ACUTE ONLY): 25 min  Charges:    $Gait Training: 8-22 mins $Therapeutic Activity: 8-22 mins PT General Charges $$ ACUTE PT VISIT: 1 Visit                     Rankin Essex PTA 12/20/23, 7:57 AM

## 2023-12-20 NOTE — Care Management Obs Status (Signed)
 MEDICARE OBSERVATION STATUS NOTIFICATION   Patient Details  Name: Jenna Carlson MRN: [id number] Date of Birth: February 18, 1951   Medicare Observation Status Notification Given:  Yes    [name] W, CMA 12/20/2023, 10:58 AM

## 2023-12-20 NOTE — Plan of Care (Signed)

## 2023-12-20 NOTE — TOC Transition Note (Addendum)
 Transition of Care [facility]) - Discharge Note   Patient Details  Name: Jenna Carlson MRN: [id number] Date of Birth: 07-Aug-1950  [facility] CM/SW Contact:  Quintella Suzen Jansky, RN Phone Number: 12/20/2023, 11:40 AM   Clinical Narrative:     TOC received message from bedside nurse that patient needed assistance with transportation home.  Met with patient at bedside. She stated she wanted assistance with the 5 steps at her home. TOC explained she would not qualify for insurance to pay for ambulance transport and she would have to pay out of pocket. TOC also explained wheelchair transport would also not be covered and she would have to pay for services as well. TOC stated cab transport could be arrange but that the cab driver would not assist her up the steps. She verbalized understanding and stated she could call someone to pick her.  TOC updated bedside nurse.   TOC sent message to Shaun with [facility] notifying him that patient is discharging today.  Final next level of care: Home w Home Health Services Barriers to Discharge: Barriers Resolved   Patient Goals and CMS Choice Patient states their goals for this hospitalization and ongoing recovery are:: go home CMS Medicare.gov Compare Post Acute Care list provided to:: Patient Choice offered to / list presented to : Patient      Discharge Placement                Patient to be transferred to facility by: family   Patient and family notified of of transfer: 12/20/23  Discharge Plan and Services Additional resources added to the After Visit Summary for       Post Acute Care Choice: Home Health            DME Agency: NA       HH Arranged: PT, OT, RN, Nurse's Aide HH Agency: [facility] Date HH Agency Contacted: 12/20/23 Time HH Agency Contacted: 1139 Representative spoke with at [facility] Agency: Shaun  Social Drivers of Health (SDOH) Interventions SDOH Screenings   Food Insecurity: No  Food Insecurity (12/19/2023)  Housing: Low Risk  (12/19/2023)  Transportation Needs: No Transportation Needs (12/19/2023)  Utilities: Not At Risk (12/19/2023)  Financial Resource Strain: Low Risk  (12/18/2023)   Received from [facility]  Social Connections: Moderately Isolated (12/19/2023)  Tobacco Use: Low Risk  (12/18/2023)  Recent Concern: Tobacco Use - Medium Risk (12/18/2023)   Received from [facility]     Readmission Risk Interventions     No data to display

## 2023-12-20 NOTE — Discharge Summary (Signed)
 Physician Discharge Summary   Patient: Jenna Carlson MRN: [id number] DOB: 08-10-1950  Admit date:     12/18/2023  Discharge date: 12/20/23  Discharge Physician: Drue ONEIDA Potter   PCP: Lenon Layman ORN, MD   Recommendations at discharge:  Follow-up with PCP  Discharge Diagnoses:  Generalized weakness AKI (acute kidney injury) (HCC) Hypokalemia Major depression Dyslipidemia GERD without esophagitis Essential hypertension  Hospital Course: Jenna Carlson is a 73 y.o. Caucasian female with medical history significant for asthma, depression, GERD, hypertension and dyslipidemia, who presented to the emergency room with acute onset of generalized weakness.  Patient received IV fluid with improvement in function.  Nausea vomiting resolved.  Patient has been cleared to follow-up with PCP.   Consultants: None Procedures performed: None Disposition: Home Diet recommendation:  Cardiac diet DISCHARGE MEDICATION: Allergies as of 12/20/2023       Reactions   Iodinated Contrast Media Shortness Of Breath   Aspirin Tinitus, Other (See Comments)   Tinnitus   Codeine    Hydrochlorothiazide Other (See Comments)   Morphine And Codeine Nausea And Vomiting        Medication List     TAKE these medications    acetaminophen  650 MG CR tablet Commonly known as: TYLENOL  Take 650 mg by mouth every 8 (eight) hours as needed.   albuterol  108 (90 Base) MCG/ACT inhaler Commonly known as: VENTOLIN  HFA Inhale 1 puff into the lungs every 4 (four) hours as needed.   ARIPiprazole 2 MG tablet Commonly known as: ABILIFY Take 2 mg by mouth daily.   ARIPiprazole 5 MG tablet Commonly known as: ABILIFY Take 5 mg by mouth daily.   azelastine 0.1 % nasal spray Commonly known as: ASTELIN Place into both nostrils 2 (two) times daily.   Breo Ellipta  100-25 MCG/INH Aepb Generic drug: fluticasone  furoate-vilanterol Inhale 1 puff into the lungs daily.   carvedilol  6.25 MG tablet Commonly  known as: COREG  Take 6.25 mg by mouth 2 (two) times daily with a meal.   Claritin  10 MG tablet Generic drug: loratadine  Take 10 mg by mouth daily.   clopidogrel  75 MG tablet Commonly known as: PLAVIX  Take 75 mg by mouth daily.   EPINEPHrine 0.3 mg/0.3 mL Soaj injection Commonly known as: EPI-PEN Inject 0.3 mg into the muscle as needed.   feeding supplement Liqd Take 237 mLs by mouth 2 (two) times daily between meals.   hyoscyamine 0.125 MG SL tablet Commonly known as: LEVSIN SL Take 0.125 mg by mouth every 6 (six) hours as needed.   lisinopril 20 MG tablet Commonly known as: ZESTRIL Take 20 mg by mouth daily.   montelukast  10 MG tablet Commonly known as: SINGULAIR  Take 10 mg by mouth at bedtime.   Nasacort Allergy 24HR 55 MCG/ACT Aero nasal inhaler Generic drug: triamcinolone Place 1 spray into the nose daily.   ondansetron  4 MG disintegrating tablet Commonly known as: Zofran  ODT Take 1 tablet (4 mg total) by mouth every 8 (eight) hours as needed for nausea or vomiting.   pantoprazole  40 MG tablet Commonly known as: PROTONIX  Take 40 mg by mouth daily.   Potassium Chloride  40 MEQ/15ML (20%) Soln Use as directed 20 mEq in the mouth or throat daily.   QUEtiapine 25 MG tablet Commonly known as: SEROQUEL Take 25 mg by mouth at bedtime.   rosuvastatin  20 MG tablet Commonly known as: CRESTOR  Take 20 mg by mouth daily.   sertraline  50 MG tablet Commonly known as: ZOLOFT  Take 50 mg by mouth daily.  Discharge Exam: Filed Weights   12/18/23 1707  Weight: 70.3 kg   GENERAL:  73 y.o.-year-old patient lying in the bed with no acute distress.  EYES: Pupils equal, round, reactive to light and accommodation. nasopharynx clear.  NECK:  Supple, no jugular venous distention. No thyroid  enlargement, no tenderness.  LUNGS: Normal breath sounds bilaterally, CARDIOVASCULAR: Regular rate and rhythm, S1, S2 normal. No murmurs, rubs, or gallops.  ABDOMEN: Soft,  nondistended, nontender. Bowel sounds present. No organomegaly or mass.  EXTREMITIES: No pedal edema, cyanosis, or clubbing.  NEUROLOGIC: Cranial nerves II through XII are intact.and good eye contact. SKIN: No obvious rash, lesion, or ulcer.  Condition at discharge: good  The results of significant diagnostics from this hospitalization (including imaging, microbiology, ancillary and laboratory) are listed below for reference.   Imaging Studies: DG Chest 2 View Result Date: 12/18/2023 CLINICAL DATA:  Weakness EXAM: CHEST - 2 VIEW COMPARISON:  Chest CT 12/15/2022 FINDINGS: The heart size and mediastinal contours are within normal limits. Both lungs are clear. The visualized skeletal structures are unremarkable. Clips in the right axilla. IMPRESSION: No active cardiopulmonary disease. Electronically Signed   By: Luke Bun M.D.   On: 12/18/2023 17:48    Microbiology: Results for orders placed or performed during the hospital encounter of 11/15/23  Urine Culture     Status: Abnormal   Collection Time: 11/15/23  9:42 PM   Specimen: Urine, Random  Result Value Ref Range Status   Specimen Description   Final    URINE, RANDOM Performed at [facility], [address] [place], [place] [address]    Special Requests   Final    NONE Performed at [facility], [address] [place], [place] [address]    Culture MULTIPLE SPECIES PRESENT, SUGGEST RECOLLECTION (A)  Final   Report Status 11/17/2023 FINAL  Final    Labs: CBC: Recent Labs  Lab 12/18/23 1711 12/19/23 0514 12/20/23 0509  WBC 9.2 8.6 8.2  NEUTROABS  --   --  4.5  HGB 11.5* 10.3* 9.2*  HCT 35.5* 31.5* 27.9*  MCV 102.0* 102.6* 102.6*  PLT 317 232 217   Basic Metabolic Panel: Recent Labs  Lab 12/18/23 1711 12/19/23 0514 12/20/23 0509  NA 140 138 140  K 2.7* 3.3* 3.6  CL 105 110 114*  CO2 19* 19* 18*  GLUCOSE 100* 64* 81  BUN 9 9 6*  CREATININE 1.34* 1.11* 1.11*  CALCIUM  8.5* 7.9* 7.7*    Liver Function Tests: No results for input(s): AST, ALT, ALKPHOS, BILITOT, PROT, ALBUMIN in the last 168 hours. CBG: No results for input(s): GLUCAP in the last 168 hours.  Discharge time spent:  36 minutes.  Signed: Drue ONEIDA Potter, MD [facility] 12/20/2023

## 2024-01-02 ENCOUNTER — Other Ambulatory Visit: Payer: Self-pay

## 2024-01-02 ENCOUNTER — Inpatient Hospital Stay
Admission: EM | Admit: 2024-01-02 | Discharge: 2024-01-11 | DRG: 392 | Disposition: A | Discharge status: Home Health | Attending: Osteopathic Medicine | Admitting: Osteopathic Medicine

## 2024-01-02 DIAGNOSIS — R1310 Dysphagia, unspecified: Secondary | ICD-10-CM | POA: Diagnosis not present

## 2024-01-02 DIAGNOSIS — I10 Essential (primary) hypertension: Secondary | ICD-10-CM | POA: Diagnosis present

## 2024-01-02 DIAGNOSIS — K222 Esophageal obstruction: Secondary | ICD-10-CM | POA: Diagnosis not present

## 2024-01-02 DIAGNOSIS — F32A Depression, unspecified: Secondary | ICD-10-CM | POA: Diagnosis present

## 2024-01-02 DIAGNOSIS — N179 Acute kidney failure, unspecified: Secondary | ICD-10-CM | POA: Diagnosis present

## 2024-01-02 DIAGNOSIS — Z886 Allergy status to analgesic agent status: Secondary | ICD-10-CM

## 2024-01-02 DIAGNOSIS — Z885 Allergy status to narcotic agent status: Secondary | ICD-10-CM

## 2024-01-02 DIAGNOSIS — E785 Hyperlipidemia, unspecified: Secondary | ICD-10-CM | POA: Diagnosis present

## 2024-01-02 DIAGNOSIS — Z8249 Family history of ischemic heart disease and other diseases of the circulatory system: Secondary | ICD-10-CM

## 2024-01-02 DIAGNOSIS — Z6825 Body mass index (BMI) 25.0-25.9, adult: Secondary | ICD-10-CM

## 2024-01-02 DIAGNOSIS — K219 Gastro-esophageal reflux disease without esophagitis: Secondary | ICD-10-CM | POA: Diagnosis present

## 2024-01-02 DIAGNOSIS — J45909 Unspecified asthma, uncomplicated: Secondary | ICD-10-CM | POA: Diagnosis present

## 2024-01-02 DIAGNOSIS — E860 Dehydration: Secondary | ICD-10-CM | POA: Diagnosis present

## 2024-01-02 DIAGNOSIS — E876 Hypokalemia: Secondary | ICD-10-CM | POA: Diagnosis present

## 2024-01-02 DIAGNOSIS — Z79899 Other long term (current) drug therapy: Secondary | ICD-10-CM

## 2024-01-02 DIAGNOSIS — N1831 Chronic kidney disease, stage 3a: Secondary | ICD-10-CM | POA: Diagnosis present

## 2024-01-02 DIAGNOSIS — I129 Hypertensive chronic kidney disease with stage 1 through stage 4 chronic kidney disease, or unspecified chronic kidney disease: Secondary | ICD-10-CM | POA: Diagnosis present

## 2024-01-02 DIAGNOSIS — Z9221 Personal history of antineoplastic chemotherapy: Secondary | ICD-10-CM

## 2024-01-02 DIAGNOSIS — Z853 Personal history of malignant neoplasm of breast: Secondary | ICD-10-CM

## 2024-01-02 DIAGNOSIS — K529 Noninfective gastroenteritis and colitis, unspecified: Secondary | ICD-10-CM | POA: Diagnosis present

## 2024-01-02 DIAGNOSIS — R531 Weakness: Principal | ICD-10-CM

## 2024-01-02 DIAGNOSIS — Z803 Family history of malignant neoplasm of breast: Secondary | ICD-10-CM

## 2024-01-02 DIAGNOSIS — K449 Diaphragmatic hernia without obstruction or gangrene: Secondary | ICD-10-CM | POA: Diagnosis present

## 2024-01-02 DIAGNOSIS — Z8711 Personal history of peptic ulcer disease: Secondary | ICD-10-CM

## 2024-01-02 DIAGNOSIS — Z66 Do not resuscitate: Secondary | ICD-10-CM | POA: Diagnosis present

## 2024-01-02 DIAGNOSIS — Z91041 Radiographic dye allergy status: Secondary | ICD-10-CM

## 2024-01-02 DIAGNOSIS — R000 Tachycardia, unspecified: Secondary | ICD-10-CM | POA: Diagnosis present

## 2024-01-02 DIAGNOSIS — Z7902 Long term (current) use of antithrombotics/antiplatelets: Secondary | ICD-10-CM

## 2024-01-02 DIAGNOSIS — R112 Nausea with vomiting, unspecified: Secondary | ICD-10-CM | POA: Diagnosis present

## 2024-01-02 DIAGNOSIS — Z888 Allergy status to other drugs, medicaments and biological substances status: Secondary | ICD-10-CM

## 2024-01-02 DIAGNOSIS — M810 Age-related osteoporosis without current pathological fracture: Secondary | ICD-10-CM | POA: Diagnosis present

## 2024-01-02 DIAGNOSIS — Z8673 Personal history of transient ischemic attack (TIA), and cerebral infarction without residual deficits: Secondary | ICD-10-CM

## 2024-01-02 DIAGNOSIS — K2970 Gastritis, unspecified, without bleeding: Secondary | ICD-10-CM | POA: Diagnosis present

## 2024-01-02 DIAGNOSIS — E663 Overweight: Secondary | ICD-10-CM | POA: Diagnosis present

## 2024-01-02 LAB — COMPREHENSIVE METABOLIC PANEL WITH GFR
ALT: 15 U/L (ref 0–44)
AST: 34 U/L (ref 15–41)
Albumin: 3.1 g/dL — ABNORMAL LOW (ref 3.5–5.0)
Alkaline Phosphatase: 50 U/L (ref 38–126)
Anion gap: 11 (ref 5–15)
BUN: 13 mg/dL (ref 8–23)
CO2: 21 mmol/L — ABNORMAL LOW (ref 22–32)
Calcium: 8.8 mg/dL — ABNORMAL LOW (ref 8.9–10.3)
Chloride: 106 mmol/L (ref 98–111)
Creatinine, Ser: 1.35 mg/dL — ABNORMAL HIGH (ref 0.44–1.00)
GFR, Estimated: 41 mL/min — ABNORMAL LOW (ref >60)
Glucose, Bld: 112 mg/dL — ABNORMAL HIGH (ref 70–99)
Potassium: 3.2 mmol/L — ABNORMAL LOW (ref 3.5–5.1)
Sodium: 138 mmol/L (ref 135–145)
Total Bilirubin: 0.7 mg/dL (ref 0.0–1.2)
Total Protein: 6.3 g/dL — ABNORMAL LOW (ref 6.5–8.1)

## 2024-01-02 LAB — CBC
HCT: 37.5 % (ref 36.0–46.0)
Hemoglobin: 12.1 g/dL (ref 12.0–15.0)
MCH: 33 pg (ref 26.0–34.0)
MCHC: 32.3 g/dL (ref 30.0–36.0)
MCV: 102.2 fL — ABNORMAL HIGH (ref 80.0–100.0)
Platelets: 365 K/uL (ref 150–400)
RBC: 3.67 MIL/uL — ABNORMAL LOW (ref 3.87–5.11)
RDW: 14 % (ref 11.5–15.5)
WBC: 12.7 K/uL — ABNORMAL HIGH (ref 4.0–10.5)
nRBC: 0 % (ref 0.0–0.2)

## 2024-01-02 LAB — MAGNESIUM: Magnesium: 1.9 mg/dL (ref 1.7–2.4)

## 2024-01-02 LAB — PHOSPHORUS: Phosphorus: 3.6 mg/dL (ref 2.5–4.6)

## 2024-01-02 LAB — CK: Total CK: 48 U/L (ref 38–234)

## 2024-01-02 MED ORDER — POTASSIUM CHLORIDE 10 MEQ/100ML IV SOLN
10.0000 meq | INTRAVENOUS | Status: AC
  Administered 2024-01-03 (×2): 10 meq via INTRAVENOUS
  Filled 2024-01-02 (×2): qty 100

## 2024-01-02 MED ORDER — POTASSIUM CHLORIDE CRYS ER 20 MEQ PO TBCR
40.0000 meq | EXTENDED_RELEASE_TABLET | Freq: Once | ORAL | Status: AC
  Administered 2024-01-03: 40 meq via ORAL
  Filled 2024-01-02: qty 2

## 2024-01-02 MED ORDER — SODIUM CHLORIDE 0.9 % IV BOLUS
1000.0000 mL | Freq: Once | INTRAVENOUS | Status: AC
  Administered 2024-01-03: 1000 mL via INTRAVENOUS

## 2024-01-02 NOTE — ED Triage Notes (Signed)
 Pt arrives via EMS from home; c/o weakness; seen recently for same and dx with hypokalemia

## 2024-01-02 NOTE — ED Provider Notes (Signed)
 [facility] Provider Note    Event Date/Time   First MD Initiated Contact with Patient 01/02/24 2328     (approximate)   History   Weakness   HPI  Jenna Carlson is a 73 y.o. female   Past medical history of remote breast cancer, hypertension hyperlipidemia GERD who presents to Emergency Department with intractable nausea and vomiting ever since her discharge earlier this month for dehydration in the setting of nausea and vomiting and hypokalemia.  She has been working with a physical therapist to try to regain strength and she is feeling profoundly weak throughout her whole body and continues to have crampy abdominal pain nausea vomiting and diarrhea without GI bleeding.  Diffuse crampy abdominal pain that is intermittent and none currently.  No urinary symptoms.  She denies respiratory infectious symptoms, chest pain or shortness of breath.    Independent Historian contributed to assessment above: She is here with her neighbor Jenna Carlson who corroborates information above  External Medical Documents Reviewed: Discharge summary from earlier this month when she was admitted for nausea vomiting dehydration hypokalemia, generalized weakness.      Physical Exam   Triage Vital Signs: ED Triage Vitals  Encounter Vitals Group     BP 01/02/24 1918 112/87     Girls Systolic BP Percentile --      Girls Diastolic BP Percentile --      Boys Systolic BP Percentile --      Boys Diastolic BP Percentile --      Pulse Rate 01/02/24 1918 (!) 102     Resp 01/02/24 1918 18     Temp 01/02/24 1918 97.7 F (36.5 C)     Temp Source 01/02/24 1918 Oral     SpO2 01/02/24 1918 97 %     Weight 01/02/24 1918 155 lb (70.3 kg)     Height 01/02/24 1918 5' 5 (1.651 m)     Head Circumference --      Peak Flow --      Pain Score 01/02/24 1929 0     Pain Loc --      Pain Education --      Exclude from Growth Chart --     Most recent vital signs: Vitals:   01/03/24  0500 01/03/24 0503  BP: (!) 149/92   Pulse: 71   Resp: 18   Temp:  97.6 F (36.4 C)  SpO2: 97%     General: Awake, no distress.  CV:  Good peripheral perfusion.  Resp:  Normal effort.  Abd:  No distention.  Other:  She looks dehydrated she has dry mucous membranes poor skin turgor.  She is soft benign abdominal exam deep palpation all quadrants.  She has no focal weakness but does global weakness throughout.   ED Results / Procedures / Treatments   Labs (all labs ordered are listed, but only abnormal results are displayed) Labs Reviewed  COMPREHENSIVE METABOLIC PANEL WITH GFR - Abnormal; Notable for the following components:      Result Value   Potassium 3.2 (*)    CO2 21 (*)    Glucose, Bld 112 (*)    Creatinine, Ser 1.35 (*)    Calcium  8.8 (*)    Total Protein 6.3 (*)    Albumin 3.1 (*)    GFR, Estimated 41 (*)    All other components within normal limits  CBC - Abnormal; Notable for the following components:   WBC 12.7 (*)    RBC 3.67 (*)  MCV 102.2 (*)    All other components within normal limits  URINALYSIS, ROUTINE W REFLEX MICROSCOPIC - Abnormal; Notable for the following components:   Color, Urine YELLOW (*)    APPearance HAZY (*)    Leukocytes,Ua TRACE (*)    Bacteria, UA RARE (*)    All other components within normal limits  BASIC METABOLIC PANEL WITH GFR - Abnormal; Notable for the following components:   Creatinine, Ser 1.31 (*)    Calcium  8.0 (*)    GFR, Estimated 42 (*)    All other components within normal limits  CBC - Abnormal; Notable for the following components:   WBC 11.2 (*)    RBC 3.08 (*)    Hemoglobin 10.3 (*)    HCT 32.0 (*)    MCV 103.9 (*)    All other components within normal limits  MAGNESIUM   PHOSPHORUS  CK     I ordered and reviewed the above labs they are notable for potassium 3.2.  White blood cell count mildly elevated 12.7.  Creatinine slightly elevated from prior at 1.35.  EKG  ED ECG REPORT I, Ginnie Shams, the  attending physician, personally viewed and interpreted this ECG.   Date: 01/02/2024  EKG Time: 1924  Rate: 107  Rhythm: sinus tachycardia  Axis: nl  Intervals:nl  ST&T Change: no stemi    RADIOLOGY I independently reviewed and interpreted CT of the abdomen pelvis see no obvious obstructive inflammatory change I also reviewed radiologist's formal read.   PROCEDURES:  Critical Care performed: No  Procedures   MEDICATIONS ORDERED IN ED: Medications  pantoprazole  (PROTONIX ) injection 40 mg (40 mg Intravenous Given 01/03/24 0409)  carvedilol  (COREG ) tablet 6.25 mg (has no administration in time range)  lisinopril  (ZESTRIL ) tablet 20 mg (has no administration in time range)  rosuvastatin  (CRESTOR ) tablet 20 mg (has no administration in time range)  ARIPiprazole  (ABILIFY ) tablet 5 mg (has no administration in time range)  QUEtiapine  (SEROQUEL ) tablet 25 mg (has no administration in time range)  albuterol  (PROVENTIL ) (2.5 MG/3ML) 0.083% nebulizer solution 2.5 mg (has no administration in time range)  enoxaparin  (LOVENOX ) injection 40 mg (has no administration in time range)  acetaminophen  (TYLENOL ) tablet 650 mg (has no administration in time range)    Or  acetaminophen  (TYLENOL ) suppository 650 mg (has no administration in time range)  ondansetron  (ZOFRAN ) tablet 4 mg (has no administration in time range)    Or  ondansetron  (ZOFRAN ) injection 4 mg (has no administration in time range)  HYDROcodone -acetaminophen  (NORCO/VICODIN) 5-325 MG per tablet 1-2 tablet (has no administration in time range)  0.9 % NaCl with KCl 40 mEq / L  infusion (125 mL/hr Intravenous New Bag/Given 01/03/24 0418)  sodium chloride  0.9 % bolus 1,000 mL (0 mLs Intravenous Stopped 01/03/24 0402)  potassium chloride  SA (KLOR-CON  M) CR tablet 40 mEq (40 mEq Oral Given 01/03/24 0000)  potassium chloride  10 mEq in 100 mL IVPB (0 mEq Intravenous Stopped 01/03/24 0325)    External physician / consultants:  I spoke  with hospital medicine for admission and regarding care plan for this patient.   IMPRESSION / MDM / ASSESSMENT AND PLAN / ED COURSE  I reviewed the triage vital signs and the nursing notes.                                Patient's presentation is most consistent with acute presentation with potential threat to life or bodily  function.  Differential diagnosis includes, but is not limited to, dehydration electrolyte derangement intractable nausea vomiting obstruction intra-abdominal infection GERD or gastritis or ulcer   The patient is on the cardiac monitor to evaluate for evidence of arrhythmia and/or significant heart rate changes.  MDM:    Is a patient who presents now with ongoing nausea and vomiting generalized weakness dehydration and some mild hypokalemia similar to prior presentations and hospitalizations in the past.  Failure to thrive at home.  Benign abdominal exam but no recent CT imaging of her abdomen so obtained 1 for possible infection obstruction etc. but fortunately looks normal.  Does not appear septic.  Plan for admission for hypokalemia intractable nausea vomiting dehydration.       FINAL CLINICAL IMPRESSION(S) / ED DIAGNOSES   Final diagnoses:  Generalized weakness  Dehydration  Nausea and vomiting, unspecified vomiting type     Rx / DC Orders   ED Discharge Orders     None        Note:  This document was prepared using Dragon voice recognition software and may include unintentional dictation errors.    Cyrena Mylar, MD 01/03/24 [phone]

## 2024-01-02 NOTE — ED Triage Notes (Addendum)
 Pt to ED via EMS, pt reports she was working with physical therapist today when she became weak, pt reports since then she has had worsening weakness. Pt reports abd cramping but denies chest pain or sob. Pt also reports she has been having problems with her potassium being low, she has been taking potassium supplements at home.

## 2024-01-03 ENCOUNTER — Emergency Department

## 2024-01-03 DIAGNOSIS — Z8719 Personal history of other diseases of the digestive system: Secondary | ICD-10-CM

## 2024-01-03 DIAGNOSIS — K222 Esophageal obstruction: Secondary | ICD-10-CM

## 2024-01-03 DIAGNOSIS — Z9221 Personal history of antineoplastic chemotherapy: Secondary | ICD-10-CM

## 2024-01-03 DIAGNOSIS — R112 Nausea with vomiting, unspecified: Secondary | ICD-10-CM | POA: Diagnosis present

## 2024-01-03 DIAGNOSIS — Z8711 Personal history of peptic ulcer disease: Secondary | ICD-10-CM

## 2024-01-03 LAB — BASIC METABOLIC PANEL WITH GFR
Anion gap: 7 (ref 5–15)
BUN: 13 mg/dL (ref 8–23)
CO2: 22 mmol/L (ref 22–32)
Calcium: 8 mg/dL — ABNORMAL LOW (ref 8.9–10.3)
Chloride: 110 mmol/L (ref 98–111)
Creatinine, Ser: 1.31 mg/dL — ABNORMAL HIGH (ref 0.44–1.00)
GFR, Estimated: 42 mL/min — ABNORMAL LOW (ref >60)
Glucose, Bld: 88 mg/dL (ref 70–99)
Potassium: 4.2 mmol/L (ref 3.5–5.1)
Sodium: 139 mmol/L (ref 135–145)

## 2024-01-03 LAB — CBC
HCT: 32 % — ABNORMAL LOW (ref 36.0–46.0)
Hemoglobin: 10.3 g/dL — ABNORMAL LOW (ref 12.0–15.0)
MCH: 33.4 pg (ref 26.0–34.0)
MCHC: 32.2 g/dL (ref 30.0–36.0)
MCV: 103.9 fL — ABNORMAL HIGH (ref 80.0–100.0)
Platelets: 275 K/uL (ref 150–400)
RBC: 3.08 MIL/uL — ABNORMAL LOW (ref 3.87–5.11)
RDW: 13.8 % (ref 11.5–15.5)
WBC: 11.2 K/uL — ABNORMAL HIGH (ref 4.0–10.5)
nRBC: 0 % (ref 0.0–0.2)

## 2024-01-03 LAB — URINALYSIS, ROUTINE W REFLEX MICROSCOPIC
Bilirubin Urine: NEGATIVE
Glucose, UA: NEGATIVE mg/dL
Hgb urine dipstick: NEGATIVE
Ketones, ur: NEGATIVE mg/dL
Nitrite: NEGATIVE
Protein, ur: NEGATIVE mg/dL
Specific Gravity, Urine: 1.01 (ref 1.005–1.030)
pH: 6 (ref 5.0–8.0)

## 2024-01-03 MED ORDER — ACETAMINOPHEN 325 MG PO TABS: 650.0000 mg | ORAL_TABLET | Freq: Four times a day (QID) | ORAL | Status: DC | PRN

## 2024-01-03 MED ORDER — ARIPIPRAZOLE 5 MG PO TABS
5.0000 mg | ORAL_TABLET | Freq: Every day | ORAL | Status: DC
  Administered 2024-01-03 – 2024-01-11 (×9): 5 mg via ORAL
  Filled 2024-01-03 (×9): qty 1

## 2024-01-03 MED ORDER — ALBUTEROL SULFATE (2.5 MG/3ML) 0.083% IN NEBU: 2.5000 mg | INHALATION_SOLUTION | RESPIRATORY_TRACT | Status: DC | PRN

## 2024-01-03 MED ORDER — ACETAMINOPHEN 650 MG RE SUPP: 650.0000 mg | Freq: Four times a day (QID) | RECTAL | Status: DC | PRN

## 2024-01-03 MED ORDER — ROSUVASTATIN CALCIUM 20 MG PO TABS
20.0000 mg | ORAL_TABLET | Freq: Every day | ORAL | Status: DC
  Administered 2024-01-03 – 2024-01-11 (×9): 20 mg via ORAL
  Filled 2024-01-03 (×9): qty 1

## 2024-01-03 MED ORDER — ENOXAPARIN SODIUM 40 MG/0.4ML IJ SOSY
40.0000 mg | PREFILLED_SYRINGE | INTRAMUSCULAR | Status: DC
  Administered 2024-01-03 – 2024-01-06 (×4): 40 mg via SUBCUTANEOUS
  Filled 2024-01-03 (×4): qty 0.4

## 2024-01-03 MED ORDER — ONDANSETRON HCL 4 MG/2ML IJ SOLN
4.0000 mg | Freq: Four times a day (QID) | INTRAMUSCULAR | Status: DC | PRN
  Administered 2024-01-03: 4 mg via INTRAVENOUS
  Filled 2024-01-03: qty 2

## 2024-01-03 MED ORDER — LISINOPRIL 20 MG PO TABS
20.0000 mg | ORAL_TABLET | Freq: Every day | ORAL | Status: DC
  Administered 2024-01-03 – 2024-01-11 (×9): 20 mg via ORAL
  Filled 2024-01-03 (×5): qty 1
  Filled 2024-01-03: qty 2
  Filled 2024-01-03 (×3): qty 1

## 2024-01-03 MED ORDER — POTASSIUM CHLORIDE IN NACL 40-0.9 MEQ/L-% IV SOLN
INTRAVENOUS | Status: AC
  Administered 2024-01-03: 125 mL/h via INTRAVENOUS
  Filled 2024-01-03: qty 1000

## 2024-01-03 MED ORDER — HYDROCODONE-ACETAMINOPHEN 5-325 MG PO TABS: 1.0000 | ORAL_TABLET | ORAL | Status: DC | PRN

## 2024-01-03 MED ORDER — PANTOPRAZOLE SODIUM 40 MG PO TBEC
40.0000 mg | DELAYED_RELEASE_TABLET | Freq: Every day | ORAL | Status: DC
  Administered 2024-01-03 – 2024-01-11 (×9): 40 mg via ORAL
  Filled 2024-01-03 (×9): qty 1

## 2024-01-03 MED ORDER — PANTOPRAZOLE SODIUM 40 MG IV SOLR
40.0000 mg | INTRAVENOUS | Status: DC
  Administered 2024-01-03: 40 mg via INTRAVENOUS
  Filled 2024-01-03: qty 10

## 2024-01-03 MED ORDER — QUETIAPINE FUMARATE 25 MG PO TABS
25.0000 mg | ORAL_TABLET | Freq: Every day | ORAL | Status: DC
  Administered 2024-01-03 – 2024-01-10 (×8): 25 mg via ORAL
  Filled 2024-01-03 (×8): qty 1

## 2024-01-03 MED ORDER — ONDANSETRON HCL 4 MG PO TABS: 4.0000 mg | ORAL_TABLET | Freq: Four times a day (QID) | ORAL | Status: DC | PRN

## 2024-01-03 MED ORDER — CARVEDILOL 6.25 MG PO TABS
6.2500 mg | ORAL_TABLET | Freq: Two times a day (BID) | ORAL | Status: DC
  Administered 2024-01-03 – 2024-01-11 (×17): 6.25 mg via ORAL
  Filled 2024-01-03 (×17): qty 1

## 2024-01-03 NOTE — Hospital Course (Signed)
 SABRA

## 2024-01-03 NOTE — ED Notes (Signed)
 PT at bedside at this time

## 2024-01-03 NOTE — ED Notes (Signed)
 Pt able to stand and pivot with 1 person assistance to the bedside commode at this time

## 2024-01-03 NOTE — Assessment & Plan Note (Addendum)
 Dysphagia with history of esophageal stenosis s/p dilatation History of peptic ulcer disease We will keep n.p.o. in case of procedure Antiemetics and Protonix  GI consult

## 2024-01-03 NOTE — ED Notes (Addendum)
 Pt called this RN to bedside stating she had vomited after trying to eat her lunch. See PRN medications and MAR. Dr Awanda raker at this time to possibly change order to liquid diet.

## 2024-01-03 NOTE — H&P (Signed)
 History and Physical    Patient: Jenna Carlson FMW:[id number] DOB: 12-Jul-1950 DOA: 01/02/2024 DOS: the patient was seen and examined on 01/03/2024 PCP: Lenon Layman ORN, MD  Patient coming from: Home  Chief Complaint:  Chief Complaint  Patient presents with   Weakness    HPI: Jenna Carlson is a 73 y.o. female with medical history significant for Hypertension, asthma, breast cancer s/p chemo, history of PUD and esophageal stenosis with last dilatation 07/2022 being admitted with intractable nausea and vomiting.  She was admitted for the same a few weeks prior.  She states that pills get stuck in her throat.  She denies abdominal pain.  Has baseline chronic diarrhea for which she sees a gastroenterologist and has an upcoming colonoscopy. In the ED, vitals unremarkable.  Labs notable for leukocytosis of 12.7,creatinine 1.35 up from baseline of 1.02, potassium 3.2 and bicarb 21, magnesium  1.9.  EKG showed sinus tachycardia at 107 with nonspecific T wave changes CT abdomen and pelvis showed a pulmonary nodule with recommendation for repeat at 6 to 12 months.  Patient treated with an NS bolus potassium repletion. Admission requested     Review of Systems: As mentioned in the history of present illness. All other systems reviewed and are negative.  Past Medical History:  Diagnosis Date   Allergic rhinitis    Asthma    Breast cancer (HCC) 1990's   right breast   Cancer (HCC)    BREAST   Depression    Edema    GERD (gastroesophageal reflux disease)    Hyperlipidemia    Hypertension    Osteoporosis, postmenopausal    Personal history of chemotherapy    Past Surgical History:  Procedure Laterality Date   COLONOSCOPY N/A 07/10/2022   Procedure: COLONOSCOPY;  Surgeon: Onita Elspeth Sharper, DO;  Location: [facility] ENDOSCOPY;  Service: Gastroenterology;  Laterality: N/A;   COLONOSCOPY N/A 01/15/2023   Procedure: COLONOSCOPY;  Surgeon: Onita Elspeth Sharper, DO;  Location: [facility]  ENDOSCOPY;  Service: Gastroenterology;  Laterality: N/A;   COLONOSCOPY WITH PROPOFOL  N/A 02/16/2017   Procedure: COLONOSCOPY WITH PROPOFOL ;  Surgeon: Viktoria Lamar DASEN, MD;  Location: [facility] ENDOSCOPY;  Service: Endoscopy;  Laterality: N/A;   ESOPHAGOGASTRODUODENOSCOPY N/A 07/10/2022   Procedure: ESOPHAGOGASTRODUODENOSCOPY (EGD);  Surgeon: Onita Elspeth Sharper, DO;  Location: [facility] ENDOSCOPY;  Service: Gastroenterology;  Laterality: N/A;   ESOPHAGOGASTRODUODENOSCOPY N/A 07/24/2022   Procedure: ESOPHAGOGASTRODUODENOSCOPY (EGD);  Surgeon: Onita Elspeth Sharper, DO;  Location: [facility] ENDOSCOPY;  Service: Gastroenterology;  Laterality: N/A;   ESOPHAGOGASTRODUODENOSCOPY (EGD) WITH PROPOFOL  N/A 02/16/2017   Procedure: ESOPHAGOGASTRODUODENOSCOPY (EGD) WITH PROPOFOL ;  Surgeon: Viktoria Lamar DASEN, MD;  Location: [facility] ENDOSCOPY;  Service: Endoscopy;  Laterality: N/A;   HEMOSTASIS CLIP PLACEMENT  01/15/2023   Procedure: HEMOSTASIS CLIP PLACEMENT;  Surgeon: Onita Elspeth Sharper, DO;  Location: [facility] ENDOSCOPY;  Service: Gastroenterology;;   MASTECTOMY     POLYPECTOMY  01/15/2023   Procedure: POLYPECTOMY;  Surgeon: Onita Elspeth Sharper, DO;  Location: [facility] ENDOSCOPY;  Service: Gastroenterology;;   ROBLEY MEYER INJECTION  01/15/2023   Procedure: SUBMUCOSAL LIFTING INJECTION;  Surgeon: Onita Elspeth Sharper, DO;  Location: [facility] ENDOSCOPY;  Service: Gastroenterology;;   WRIST SURGERY Left    Social History:  reports that she has never smoked. She has never used smokeless tobacco. She reports current alcohol use. She reports that she does not use drugs.  Allergies  Allergen Reactions   Iodinated Contrast Media Shortness Of Breath   Aspirin Tinitus and Other (See Comments)    Tinnitus   Codeine  Hydrochlorothiazide Other (See Comments)   Morphine And Codeine Nausea And Vomiting    Family History  Problem Relation Age of Onset   Heart attack Father    Hypertension Other    Cancer Maternal Aunt    Breast  cancer Maternal Aunt     Prior to Admission medications   Medication Sig Start Date End Date Taking? Authorizing Provider  acetaminophen  (TYLENOL ) 650 MG CR tablet Take 650 mg by mouth every 8 (eight) hours as needed.    [provider]  albuterol  (VENTOLIN  HFA) 108 (90 Base) MCG/ACT inhaler Inhale 1 puff into the lungs every 4 (four) hours as needed. 02/04/18   [provider]  ARIPiprazole  (ABILIFY ) 2 MG tablet Take 2 mg by mouth daily. 12/04/23 04/02/24  [provider]  ARIPiprazole  (ABILIFY ) 5 MG tablet Take 5 mg by mouth daily. 12/18/23 04/16/24  [provider]  azelastine  (ASTELIN ) 0.1 % nasal spray Place into both nostrils 2 (two) times daily.    [provider]  carvedilol  (COREG ) 6.25 MG tablet Take 6.25 mg by mouth 2 (two) times daily with a meal. 11/05/23 11/04/24  [provider]  clopidogrel  (PLAVIX ) 75 MG tablet Take 75 mg by mouth daily.    [provider]  EPINEPHrine 0.3 mg/0.3 mL IJ SOAJ injection Inject 0.3 mg into the muscle as needed.    [provider]  feeding supplement (ENSURE PLUS HIGH PROTEIN) LIQD Take 237 mLs by mouth 2 (two) times daily between meals. 11/17/23   Alexander, Natalie, DO  fluticasone  furoate-vilanterol (BREO ELLIPTA ) 100-25 MCG/INH AEPB Inhale 1 puff into the lungs daily.    [provider]  hyoscyamine (LEVSIN SL) 0.125 MG SL tablet Take 0.125 mg by mouth every 6 (six) hours as needed. 11/29/22   [provider]  lisinopril  (ZESTRIL ) 20 MG tablet Take 20 mg by mouth daily. 09/13/23 09/12/24  [provider]  loratadine  (CLARITIN ) 10 MG tablet Take 10 mg by mouth daily.    [provider]  montelukast  (SINGULAIR ) 10 MG tablet Take 10 mg by mouth at bedtime.    [provider]  ondansetron  (ZOFRAN  ODT) 4 MG disintegrating tablet Take 1 tablet (4 mg total) by mouth every 8 (eight) hours as needed for nausea or vomiting. 01/23/17   Dorothyann Drivers,  MD  pantoprazole  (PROTONIX ) 40 MG tablet Take 40 mg by mouth daily. 07/25/23 07/24/24  [provider]  Potassium Chloride  40 MEQ/15ML (20%) SOLN Use as directed 20 mEq in the mouth or throat daily. 08/02/23   [provider]  QUEtiapine  (SEROQUEL ) 25 MG tablet Take 25 mg by mouth at bedtime. 11/26/23   [provider]  rosuvastatin  (CRESTOR ) 20 MG tablet Take 20 mg by mouth daily. 05/31/23 05/30/24  [provider]  sertraline  (ZOLOFT ) 50 MG tablet Take 50 mg by mouth daily.    [provider]  triamcinolone  (NASACORT  ALLERGY 24HR) 55 MCG/ACT AERO nasal inhaler Place 1 spray into the nose daily.    [provider]    Physical Exam: Vitals:   01/02/24 1918 01/03/24 0101  BP: 112/87 (!) 150/95  Pulse: (!) 102 91  Resp: 18 15  Temp: 97.7 F (36.5 C) 97.7 F (36.5 C)  TempSrc: Oral Oral  SpO2: 97% 97%  Weight: 70.3 kg   Height: 5' 5 (1.651 m)    Physical Exam Vitals and nursing note reviewed.  Constitutional:      General: She is not in acute distress. HENT:  Head: Normocephalic and atraumatic.  Cardiovascular:     Rate and Rhythm: Normal rate and regular rhythm.     Heart sounds: Normal heart sounds.  Pulmonary:     Effort: Pulmonary effort is normal.     Breath sounds: Normal breath sounds.  Abdominal:     Palpations: Abdomen is soft.     Tenderness: There is no abdominal tenderness.  Neurological:     Mental Status: Mental status is at baseline.     Labs on Admission: I have personally reviewed following labs and imaging studies  CBC: Recent Labs  Lab 01/02/24 1930  WBC 12.7*  HGB 12.1  HCT 37.5  MCV 102.2*  PLT 365   Basic Metabolic Panel: Recent Labs  Lab 01/02/24 1923 01/02/24 1930  NA  --  138  K  --  3.2*  CL  --  106  CO2  --  21*  GLUCOSE  --  112*  BUN  --  13  CREATININE  --  1.35*  CALCIUM   --  8.8*  MG 1.9  --   PHOS 3.6  --    GFR: Estimated Creatinine Clearance: 35.4 mL/min (A) (by  C-G formula based on SCr of 1.35 mg/dL (H)). Liver Function Tests: Recent Labs  Lab 01/02/24 1930  AST 34  ALT 15  ALKPHOS 50  BILITOT 0.7  PROT 6.3*  ALBUMIN 3.1*   No results for input(s): LIPASE, AMYLASE in the last 168 hours. No results for input(s): AMMONIA in the last 168 hours. Coagulation Profile: No results for input(s): INR, PROTIME in the last 168 hours. Cardiac Enzymes: Recent Labs  Lab 01/02/24 1923  CKTOTAL 48   BNP (last 3 results) No results for input(s): PROBNP in the last 8760 hours. HbA1C: No results for input(s): HGBA1C in the last 72 hours. CBG: No results for input(s): GLUCAP in the last 168 hours. Lipid Profile: No results for input(s): CHOL, HDL, LDLCALC, TRIG, CHOLHDL, LDLDIRECT in the last 72 hours. Thyroid  Function Tests: No results for input(s): TSH, T4TOTAL, FREET4, T3FREE, THYROIDAB in the last 72 hours. Anemia Panel: No results for input(s): VITAMINB12, FOLATE, FERRITIN, TIBC, IRON, RETICCTPCT in the last 72 hours. Urine analysis:    Component Value Date/Time   COLORURINE YELLOW (A) 12/18/2023 2230   APPEARANCEUR CLEAR (A) 12/18/2023 2230   LABSPEC 1.004 (L) 12/18/2023 2230   PHURINE 7.0 12/18/2023 2230   GLUCOSEU NEGATIVE 12/18/2023 2230   HGBUR SMALL (A) 12/18/2023 2230   BILIRUBINUR NEGATIVE 12/18/2023 2230   KETONESUR 5 (A) 12/18/2023 2230   PROTEINUR 30 (A) 12/18/2023 2230   NITRITE NEGATIVE 12/18/2023 2230   LEUKOCYTESUR TRACE (A) 12/18/2023 2230    Radiological Exams on Admission: CT ABDOMEN PELVIS WO CONTRAST Result Date: 01/03/2024 CLINICAL DATA:  Acute abdominal pain and weakness, initial encounter EXAM: CT ABDOMEN AND PELVIS WITHOUT CONTRAST TECHNIQUE: Multidetector CT imaging of the abdomen and pelvis was performed following the standard protocol without IV contrast. RADIATION DOSE REDUCTION: This exam was performed according to the departmental dose-optimization program  which includes automated exposure control, adjustment of the mA and/or kV according to patient size and/or use of iterative reconstruction technique. COMPARISON:  01/23/2017 FINDINGS: Lower chest: Lung bases are free of acute infiltrate or sizable effusion. A small ground-glass nodule is noted best seen on image number 3 of series 4 measuring 6.5 mm in greatest dimension. Hepatobiliary: No focal liver abnormality is seen. No gallstones, gallbladder wall thickening, or biliary dilatation. Pancreas: Unremarkable. No pancreatic ductal dilatation or  surrounding inflammatory changes. Spleen: Normal in size without focal abnormality. Adrenals/Urinary Tract: Adrenal glands are within normal limits. Kidneys are well visualized bilaterally. No renal calculi or obstructive changes are seen. The ureter is within normal limits. Bladder is well distended. Stomach/Bowel: Scattered diverticular change of the colon is noted without evidence of diverticulitis. The appendix is within normal limits. No inflammatory changes are seen. Small bowel and stomach are unremarkable with the exception of a sliding-type hiatal hernia. Vascular/Lymphatic: Aortic atherosclerosis. No enlarged abdominal or pelvic lymph nodes. Reproductive: Uterus and bilateral adnexa are unremarkable. Other: No abdominal wall hernia or abnormality. No abdominopelvic ascites. Musculoskeletal: No acute or significant osseous findings. IMPRESSION: Diverticulosis without diverticulitis. Ground-glass pulmonary nodule measuring 7 mm. Per Fleischner Society Guidelines, recommend a non-contrast Chest CT at 6-12 months to confirm persistence, then additional non-contrast Chest CTs every 2 years until 5 years. If nodule grows or develops solid component(s), consider resection. These guidelines do not apply to immunocompromised patients and patients with cancer. Follow up in patients with significant comorbidities as clinically warranted. For lung cancer screening, adhere to  Lung-RADS guidelines. Reference: Radiology. 2017; 284(1):228-43. Electronically Signed   By: Oneil Devonshire M.D.   On: 01/03/2024 00:50   Data Reviewed for HPI: Relevant notes from primary care and specialist visits, past discharge summaries as available in EHR, including Care Everywhere. Prior diagnostic testing as pertinent to current admission diagnoses Updated medications and problem lists for reconciliation ED course, including vitals, labs, imaging, treatment and response to treatment Triage notes, nursing and pharmacy notes and ED provider's notes Notable results as noted above in HPI      Assessment and Plan: * Intractable nausea and vomiting Dysphagia with history of esophageal stenosis s/p dilatation History of peptic ulcer disease We will keep n.p.o. in case of procedure Antiemetics and Protonix  GI consult  AKI (acute kidney injury) (HCC) Secondary to vomiting IV hydration and monitor  Hypokalemia Repleted in the ED Monitor and correct as needed  Personal history of chemotherapy History of breast cancer No acute issues suspected  Asthma, chronic Not acutely exacerbated Albuterol  as needed  Essential hypertension Lisinopril   Chronic diarrhea Followed by GI.  Has upcoming colonoscopy     DVT prophylaxis: SCD  Consults: GI Dr. Aundria  Advance Care Planning:   Code Status: Prior   Family Communication: none  Disposition Plan: Back to previous home environment  Severity of Illness: The appropriate patient status for this patient is OBSERVATION. Observation status is judged to be reasonable and necessary in order to provide the required intensity of service to ensure the patient's safety. The patient's presenting symptoms, physical exam findings, and initial radiographic and laboratory data in the context of their medical condition is felt to place them at decreased risk for further clinical deterioration. Furthermore, it is anticipated that the patient  will be medically stable for discharge from the hospital within 2 midnights of admission.   Author: Delayne LULLA Solian, MD 01/03/2024 3:14 AM  For on call review [email or web address].

## 2024-01-03 NOTE — Assessment & Plan Note (Signed)
 Lisinopril

## 2024-01-03 NOTE — Assessment & Plan Note (Signed)
 Followed by GI.  Has upcoming colonoscopy

## 2024-01-03 NOTE — Assessment & Plan Note (Signed)
 Repleted in the ED Monitor and correct as needed

## 2024-01-03 NOTE — Assessment & Plan Note (Signed)
Not acutely exacerbated.  Albuterol as needed 

## 2024-01-03 NOTE — Evaluation (Signed)
 Physical Therapy Evaluation Patient Details Name: Jenna Carlson MRN: [id number] DOB: 1951/01/07 Today's Date: 01/03/2024  History of Present Illness  Jenna Carlson is a 73 y.o. female with medical history significant for Hypertension, asthma, breast cancer s/p chemo, history of PUD and esophageal stenosis with last dilatation 07/2022 being admitted with intractable nausea and vomiting.  She was admitted for the same a few weeks prior.  She states that pills get stuck in her throat.  She denies abdominal pain.  Has baseline chronic diarrhea for which she sees a gastroenterologist and has an upcoming colonoscopy.  Clinical Impression  Patient received in bed, she is agreeable to PT assessment. Patient states she has only been up to [facility]. She requires increased time to get to edge of bed. Stands with min A. Patient ambulated ~15 feet with RW and cga. Limited by weakness and fatigue. Patient required min A to get back into bed. She will continue to benefit from skilled PT to improve functional independence, endurance and strength.          If plan is discharge home, recommend the following: A little help with walking and/or transfers;A little help with bathing/dressing/bathroom;Assist for transportation;Help with stairs or ramp for entrance   Can travel by private vehicle   Yes    Equipment Recommendations None recommended by PT  Recommendations for Other Services       Functional Status Assessment Patient has had a recent decline in their functional status and demonstrates the ability to make significant improvements in function in a reasonable and predictable amount of time.     Precautions / Restrictions Precautions Precautions: Fall Recall of Precautions/Restrictions: Intact Restrictions Weight Bearing Restrictions Per Provider Order: No      Mobility  Bed Mobility Overal bed mobility: Needs Assistance Bed Mobility: Supine to Sit, Sit to Supine     Supine to sit: Modified  independent (Device/Increase time) Sit to supine: Min assist   General bed mobility comments: min A to return LEs back to bed. Increased time due to weakness    Transfers Overall transfer level: Needs assistance Equipment used: Rolling walker (2 wheels) Transfers: Sit to/from Stand Sit to Stand: Min assist           General transfer comment: min A to power up to stand from low bed.    Ambulation/Gait Ambulation/Gait assistance: Contact guard assist Gait Distance (Feet): 15 Feet Assistive device: Rolling walker (2 wheels) Gait Pattern/deviations: Step-through pattern, Decreased step length - right, Decreased step length - left, Decreased stride length Gait velocity: decreased     General Gait Details: patient with decreased activity tolerance and decreased strength  Stairs            Wheelchair Mobility     Tilt Bed    Modified Rankin (Stroke Patients Only)       Balance Overall balance assessment: Needs assistance Sitting-balance support: Feet supported Sitting balance-Leahy Scale: Good     Standing balance support: Bilateral upper extremity supported, During functional activity, Reliant on assistive device for balance Standing balance-Leahy Scale: Fair Standing balance comment: use of RW                             Pertinent Vitals/Pain Pain Assessment Pain Assessment: No/denies pain    Home Living Family/patient expects to be discharged to:: Private residence Living Arrangements: Alone Available Help at Discharge: Family;Friend(s);Available PRN/intermittently Type of Home: House Home Access: Stairs to enter Entrance  Stairs-Rails: Can reach both;Left;Right Entrance Stairs-Number of Steps: 5   Home Layout: Two level;Able to live on main level with bedroom/bathroom Home Equipment: Toilet riser;Shower Counsellor (2 wheels);Rollator (4 wheels);Cane - single point;Hand held shower head Additional Comments: has an aide come in  x1/wk to help her shower, currently has [facility] PT[facility]OT come x2/wk    Prior Function Prior Level of Function : Independent/Modified Independent             Mobility Comments: friends assist with driving for errands/groceries lately. ambulatory with RW especially the last couple of weeks; uses [facility] to get to her car for appts and then transport/WC for appts ADLs Comments: independent, has meds delivered     Extremity/Trunk Assessment   Upper Extremity Assessment Upper Extremity Assessment: Generalized weakness    Lower Extremity Assessment Lower Extremity Assessment: Generalized weakness    Cervical / Trunk Assessment Cervical / Trunk Assessment: Normal  Communication   Communication Communication: No apparent difficulties    Cognition Arousal: Alert Behavior During Therapy: WFL for tasks assessed/performed   PT - Cognitive impairments: No apparent impairments                         Following commands: Intact       Cueing Cueing Techniques: Verbal cues     General Comments      Exercises     Assessment/Plan    PT Assessment Patient needs continued PT services  PT Problem List Decreased strength;Decreased activity tolerance;Decreased mobility       PT Treatment Interventions DME instruction;Gait training;Stair training;Functional mobility training;Therapeutic activities;Therapeutic exercise;Patient/family education    PT Goals (Current goals can be found in the Care Plan section)  Acute Rehab PT Goals Patient Stated Goal: to get stronger PT Goal Formulation: With patient Time For Goal Achievement: 01/17/24 Potential to Achieve Goals: Good    Frequency Min 2X/week     Co-evaluation               AM-PAC PT 6 Clicks Mobility  Outcome Measure Help needed turning from your back to your side while in a flat bed without using bedrails?: A Little Help needed moving from lying on your back to sitting on the side of a flat bed without using  bedrails?: A Little Help needed moving to and from a bed to a chair (including a wheelchair)?: A Little Help needed standing up from a chair using your arms (e.g., wheelchair or bedside chair)?: A Little Help needed to walk in hospital room?: A Little Help needed climbing 3-5 steps with a railing? : A Lot 6 Click Score: 17    End of Session   Activity Tolerance: Patient limited by fatigue Patient left: in bed;with call bell/phone within reach;with bed alarm set Nurse Communication: Mobility status PT Visit Diagnosis: Other abnormalities of gait and mobility (R26.89);Muscle weakness (generalized) (M62.81);Difficulty in walking, not elsewhere classified (R26.2)    Time: 8693-8681 PT Time Calculation (min) (ACUTE ONLY): 12 min   Charges:   PT Evaluation $PT Eval Low Complexity: 1 Low   PT General Charges $$ ACUTE PT VISIT: 1 Visit         Davyn Morandi, PT, GCS 01/03/24,2:37 PM

## 2024-01-03 NOTE — Assessment & Plan Note (Signed)
 Secondary to vomiting IV hydration and monitor

## 2024-01-03 NOTE — Assessment & Plan Note (Addendum)
 History of breast cancer No acute issues suspected

## 2024-01-03 NOTE — Consult Note (Signed)
 Rogelia Copping, MD [facility]  [address] [place], [place] [address] Phone: [phone] Fax : [phone]  Consultation  Referring Provider:     Dr. Cleatus Primary Care Physician:  Lenon Layman ORN, MD Primary Gastroenterologist:  Dr. Onita         Reason for Consultation:     Nausea and vomiting  Date of Admission:  01/02/2024 Date of Consultation:  01/03/2024         HPI:   Jenna Carlson is a 73 y.o. female who has a history of hypertension breast cancer asthma peptic ulcer disease and esophageal stenosis.  The patient tells me that she had an upper endoscopy for dysphagia back in February of last year with a significant narrowing seen.  Review of the chart shows the patient had dilation up to 12 mm.  The patient has been on Plavix  and also has a history of diarrhea.  She came in with the report of weakness.  She states that she does have a feeling of pills getting stuck in her throat.  She has had intermittent nausea and vomiting after eating but states that it is not all the time.  The patient also had a colonoscopy in July of last year.  I am not being asked to see the patient for the nausea vomiting and dysphagia.  She last took her Plavix  yesterday morning.  It has been held and she did not have it this morning.  There is no report of any abdominal pain but she does state that she has had a significant amount of weight loss over the last year but is not sure exactly how much weight loss she has had.  Past Medical History:  Diagnosis Date   Allergic rhinitis    Asthma    Breast cancer (HCC) 1990's   right breast   Cancer (HCC)    BREAST   Depression    Edema    GERD (gastroesophageal reflux disease)    Hyperlipidemia    Hypertension    Osteoporosis, postmenopausal    Personal history of chemotherapy     Past Surgical History:  Procedure Laterality Date   COLONOSCOPY N/A 07/10/2022   Procedure: COLONOSCOPY;  Surgeon: Onita Elspeth Sharper, DO;  Location:  [facility] ENDOSCOPY;  Service: Gastroenterology;  Laterality: N/A;   COLONOSCOPY N/A 01/15/2023   Procedure: COLONOSCOPY;  Surgeon: Onita Elspeth Sharper, DO;  Location: [facility] ENDOSCOPY;  Service: Gastroenterology;  Laterality: N/A;   COLONOSCOPY WITH PROPOFOL  N/A 02/16/2017   Procedure: COLONOSCOPY WITH PROPOFOL ;  Surgeon: Viktoria Lamar DASEN, MD;  Location: [facility] ENDOSCOPY;  Service: Endoscopy;  Laterality: N/A;   ESOPHAGOGASTRODUODENOSCOPY N/A 07/10/2022   Procedure: ESOPHAGOGASTRODUODENOSCOPY (EGD);  Surgeon: Onita Elspeth Sharper, DO;  Location: [facility] ENDOSCOPY;  Service: Gastroenterology;  Laterality: N/A;   ESOPHAGOGASTRODUODENOSCOPY N/A 07/24/2022   Procedure: ESOPHAGOGASTRODUODENOSCOPY (EGD);  Surgeon: Onita Elspeth Sharper, DO;  Location: [facility] ENDOSCOPY;  Service: Gastroenterology;  Laterality: N/A;   ESOPHAGOGASTRODUODENOSCOPY (EGD) WITH PROPOFOL  N/A 02/16/2017   Procedure: ESOPHAGOGASTRODUODENOSCOPY (EGD) WITH PROPOFOL ;  Surgeon: Viktoria Lamar DASEN, MD;  Location: [facility] ENDOSCOPY;  Service: Endoscopy;  Laterality: N/A;   HEMOSTASIS CLIP PLACEMENT  01/15/2023   Procedure: HEMOSTASIS CLIP PLACEMENT;  Surgeon: Onita Elspeth Sharper, DO;  Location: [facility] ENDOSCOPY;  Service: Gastroenterology;;   MASTECTOMY     POLYPECTOMY  01/15/2023   Procedure: POLYPECTOMY;  Surgeon: Onita Elspeth Sharper, DO;  Location: [facility] ENDOSCOPY;  Service: Gastroenterology;;   SUBMUCOSAL LIFTING INJECTION  01/15/2023   Procedure: ROBLEY  LIFTING INJECTION;  Surgeon: Onita Elspeth Sharper, DO;  Location: [facility] ENDOSCOPY;  Service: Gastroenterology;;   WRIST SURGERY Left     Prior to Admission medications   Medication Sig Start Date End Date Taking? Authorizing Provider  acetaminophen  (TYLENOL ) 650 MG CR tablet Take 650 mg by mouth every 8 (eight) hours as needed.   Yes [provider]  albuterol  (VENTOLIN  HFA) 108 (90 Base) MCG/ACT inhaler Inhale 1 puff into the lungs every 4 (four) hours as needed. 02/04/18  Yes  [provider]  ARIPiprazole  (ABILIFY ) 2 MG tablet Take 2 mg by mouth daily. 12/04/23 04/02/24 Yes [provider]  ARIPiprazole  (ABILIFY ) 5 MG tablet Take 5 mg by mouth daily. 12/18/23 04/16/24 Yes [provider]  azelastine  (ASTELIN ) 0.1 % nasal spray Place into both nostrils 2 (two) times daily.   Yes [provider]  cetirizine (ZYRTEC) 10 MG tablet Take 10 mg by mouth at bedtime.   Yes [provider]  clopidogrel  (PLAVIX ) 75 MG tablet Take 75 mg by mouth daily.   Yes [provider]  EPINEPHrine 0.3 mg/0.3 mL IJ SOAJ injection Inject 0.3 mg into the muscle as needed.   Yes [provider]  fluticasone  furoate-vilanterol (BREO ELLIPTA ) 100-25 MCG/INH AEPB Inhale 1 puff into the lungs daily.   Yes [provider]  montelukast  (SINGULAIR ) 10 MG tablet Take 10 mg by mouth at bedtime.   Yes [provider]  Multiple Vitamins-Minerals (PRESERVISION AREDS 2) CAPS Take 1 capsule by mouth 2 (two) times daily.   Yes [provider]  ondansetron  (ZOFRAN ) 4 MG tablet Take 4 mg by mouth every 8 (eight) hours as needed. 12/25/23  Yes [provider]  pantoprazole  (PROTONIX ) 40 MG tablet Take 40 mg by mouth daily. 07/25/23 07/24/24 Yes [provider]  Potassium Chloride  40 MEQ/15ML (20%) SOLN Use as directed 20 mEq in the mouth or throat daily. 08/02/23  Yes [provider]  QUEtiapine  (SEROQUEL ) 25 MG tablet Take 25 mg by mouth at bedtime. 11/26/23  Yes [provider]  rosuvastatin  (CRESTOR ) 20 MG tablet Take 20 mg by mouth daily. 05/31/23 05/30/24 Yes [provider]  carvedilol  (COREG ) 6.25 MG tablet Take 6.25 mg by mouth 2 (two) times daily with a meal. Patient not taking: Reported on 01/03/2024 11/05/23 11/04/24  [provider]  feeding supplement (ENSURE PLUS HIGH PROTEIN) LIQD Take 237 mLs by mouth 2 (two) times daily between meals. 11/17/23   Alexander, Natalie, DO   hyoscyamine (LEVSIN SL) 0.125 MG SL tablet Take 0.125 mg by mouth every 6 (six) hours as needed. Patient not taking: Reported on 01/03/2024 11/29/22   [provider]  lisinopril  (ZESTRIL ) 20 MG tablet Take 20 mg by mouth daily. Patient not taking: Reported on 01/03/2024 09/13/23 09/12/24  [provider]  loratadine  (CLARITIN ) 10 MG tablet Take 10 mg by mouth daily. Patient not taking: Reported on 01/03/2024    [provider]  ondansetron  (ZOFRAN  ODT) 4 MG disintegrating tablet Take 1 tablet (4 mg total) by mouth every 8 (eight) hours as needed for nausea or vomiting. Patient not taking: Reported on 01/03/2024 01/23/17   Dorothyann Drivers, MD  sertraline  (ZOLOFT ) 50 MG tablet Take 50 mg by mouth daily. Patient not taking: Reported on 01/03/2024    [provider]  triamcinolone  (NASACORT  ALLERGY 24HR) 55 MCG/ACT AERO nasal inhaler Place 1 spray into the nose daily. Patient not taking: Reported on 01/03/2024    [provider]  Family History  Problem Relation Age of Onset   Heart attack Father    Hypertension Other    Cancer Maternal Aunt    Breast cancer Maternal Aunt      Social History   Tobacco Use   Smoking status: Never   Smokeless tobacco: Never  Vaping Use   Vaping status: Never Used  Substance Use Topics   Alcohol use: Yes    Comment: occasional wine   Drug use: No    Allergies as of 01/02/2024 - Review Complete 01/02/2024  Allergen Reaction Noted   Iodinated contrast media Shortness Of Breath 01/17/2017   Aspirin Tinitus and Other (See Comments) 03/17/2014   Codeine  01/23/2017   Hydrochlorothiazide Other (See Comments) 02/15/2017   Morphine and codeine Nausea And Vomiting 01/23/2017    Review of Systems:    All systems reviewed and negative except where noted in HPI.   Physical Exam:  Vital signs in last 24 hours: Temp:  [97.6 F (36.4 C)-97.8 F (36.6 C)] 97.8 F (36.6 C) (07/17 1005) Pulse Rate:  [71-102] 71  (07/17 0730) Resp:  [12-18] 12 (07/17 0730) BP: (112-165)/(81-95) 165/81 (07/17 0730) SpO2:  [97 %-99 %] 99 % (07/17 0730) Weight:  [70.3 kg] 70.3 kg (07/16 1918)   General:   Pleasant, cooperative in NAD Head:  Normocephalic and atraumatic. Eyes:   No icterus.   Conjunctiva pink. PERRLA. Ears:  Normal auditory acuity. Neck:  Supple; no masses or thyroidomegaly Lungs: Respirations even and unlabored. Lungs clear to auscultation bilaterally.   No wheezes, crackles, or rhonchi.  Heart:  Regular rate and rhythm;  Without murmur, clicks, rubs or gallops Abdomen:  Soft, nondistended, nontender. Normal bowel sounds. No appreciable masses or hepatomegaly.  No rebound or guarding.  Rectal:  Not performed. Msk:  Symmetrical without gross deformities.   Extremities:  Without edema, cyanosis or clubbing. Neurologic:  Alert and oriented x3;  grossly normal neurologically. Skin:  Intact without significant lesions or rashes. Cervical Nodes:  No significant cervical adenopathy. Psych:  Alert and cooperative. Normal affect.  LAB RESULTS: Recent Labs    01/02/24 1930 01/03/24 0419  WBC 12.7* 11.2*  HGB 12.1 10.3*  HCT 37.5 32.0*  PLT 365 275   BMET Recent Labs    01/02/24 1930 01/03/24 0419  NA 138 139  K 3.2* 4.2  CL 106 110  CO2 21* 22  GLUCOSE 112* 88  BUN 13 13  CREATININE 1.35* 1.31*  CALCIUM  8.8* 8.0*   LFT Recent Labs    01/02/24 1930  PROT 6.3*  ALBUMIN 3.1*  AST 34  ALT 15  ALKPHOS 50  BILITOT 0.7   PT/INR No results for input(s): LABPROT, INR in the last 72 hours.  STUDIES: CT ABDOMEN PELVIS WO CONTRAST Result Date: 01/03/2024 CLINICAL DATA:  Acute abdominal pain and weakness, initial encounter EXAM: CT ABDOMEN AND PELVIS WITHOUT CONTRAST TECHNIQUE: Multidetector CT imaging of the abdomen and pelvis was performed following the standard protocol without IV contrast. RADIATION DOSE REDUCTION: This exam was performed according to the departmental  dose-optimization program which includes automated exposure control, adjustment of the mA and/or kV according to patient size and/or use of iterative reconstruction technique. COMPARISON:  01/23/2017 FINDINGS: Lower chest: Lung bases are free of acute infiltrate or sizable effusion. A small ground-glass nodule is noted best seen on image number 3 of series 4 measuring 6.5 mm in greatest dimension. Hepatobiliary: No focal liver abnormality is seen. No gallstones, gallbladder wall thickening, or biliary dilatation. Pancreas:  Unremarkable. No pancreatic ductal dilatation or surrounding inflammatory changes. Spleen: Normal in size without focal abnormality. Adrenals/Urinary Tract: Adrenal glands are within normal limits. Kidneys are well visualized bilaterally. No renal calculi or obstructive changes are seen. The ureter is within normal limits. Bladder is well distended. Stomach/Bowel: Scattered diverticular change of the colon is noted without evidence of diverticulitis. The appendix is within normal limits. No inflammatory changes are seen. Small bowel and stomach are unremarkable with the exception of a sliding-type hiatal hernia. Vascular/Lymphatic: Aortic atherosclerosis. No enlarged abdominal or pelvic lymph nodes. Reproductive: Uterus and bilateral adnexa are unremarkable. Other: No abdominal wall hernia or abnormality. No abdominopelvic ascites. Musculoskeletal: No acute or significant osseous findings. IMPRESSION: Diverticulosis without diverticulitis. Ground-glass pulmonary nodule measuring 7 mm. Per Fleischner Society Guidelines, recommend a non-contrast Chest CT at 6-12 months to confirm persistence, then additional non-contrast Chest CTs every 2 years until 5 years. If nodule grows or develops solid component(s), consider resection. These guidelines do not apply to immunocompromised patients and patients with cancer. Follow up in patients with significant comorbidities as clinically warranted. For lung  cancer screening, adhere to Lung-RADS guidelines. Reference: Radiology. 2017; 284(1):228-43. Electronically Signed   By: Oneil Devonshire M.D.   On: 01/03/2024 00:50      Impression / Plan:   Assessment: Principal Problem:   Intractable nausea and vomiting Active Problems:   Hypokalemia   Chronic diarrhea   AKI (acute kidney injury) (HCC)   Essential hypertension   Asthma, chronic   Personal history of chemotherapy   History of esophageal stenosis s/p dilatation   History of peptic ulcer   Jenna Carlson is a 73 y.o. y/o female with nausea vomiting intermittently when she eats with a history of esophageal stenosis and report of dysphagia.  The patient has been on Plavix  and reports it is due to a history of strokes.  The patient took her Plavix  24 hours ago.  Plan:  The patient will need to have the Plavix  washed out before she has any GI intervention such as an EGD with dilation.  The Plavix  should be held for 5 days prior to doing any high risk intervention like dilation.  The patient does report that her symptoms have been intermittent and she has been following up with Dr. Onita in the past.  The earliest that the procedure would be plan would be for next Monday.  If the patient remains in the hospital then we will do the procedure as inpatient otherwise if the patient is discharged then she can follow-up with Dr. Onita for an EGD with possible dilation off of the Plavix .  I will follow with chart reviews until the patient is discharged or until Monday when the patient may need to have the procedure done.  Thank you for involving me in the care of this patient.      LOS: 0 days   Rogelia Copping, MD, MD. NOLIA 01/03/2024, 10:28 AM,  Pager [phone] 7am-5pm  Check AMION for 5pm -7am coverage and on weekends   Note: This dictation was prepared with Dragon dictation along with smaller phrase technology. Any transcriptional errors that result from this process are unintentional.

## 2024-01-04 DIAGNOSIS — J45909 Unspecified asthma, uncomplicated: Secondary | ICD-10-CM | POA: Diagnosis present

## 2024-01-04 DIAGNOSIS — Z7902 Long term (current) use of antithrombotics/antiplatelets: Secondary | ICD-10-CM | POA: Diagnosis not present

## 2024-01-04 DIAGNOSIS — Z803 Family history of malignant neoplasm of breast: Secondary | ICD-10-CM | POA: Diagnosis not present

## 2024-01-04 DIAGNOSIS — R1310 Dysphagia, unspecified: Secondary | ICD-10-CM | POA: Diagnosis present

## 2024-01-04 DIAGNOSIS — R112 Nausea with vomiting, unspecified: Secondary | ICD-10-CM | POA: Diagnosis present

## 2024-01-04 DIAGNOSIS — Z8719 Personal history of other diseases of the digestive system: Secondary | ICD-10-CM | POA: Diagnosis not present

## 2024-01-04 DIAGNOSIS — E876 Hypokalemia: Secondary | ICD-10-CM | POA: Diagnosis present

## 2024-01-04 DIAGNOSIS — E860 Dehydration: Secondary | ICD-10-CM | POA: Diagnosis present

## 2024-01-04 DIAGNOSIS — K222 Esophageal obstruction: Secondary | ICD-10-CM | POA: Diagnosis present

## 2024-01-04 DIAGNOSIS — F32A Depression, unspecified: Secondary | ICD-10-CM | POA: Diagnosis present

## 2024-01-04 DIAGNOSIS — Z8711 Personal history of peptic ulcer disease: Secondary | ICD-10-CM | POA: Diagnosis not present

## 2024-01-04 DIAGNOSIS — Z66 Do not resuscitate: Secondary | ICD-10-CM | POA: Diagnosis present

## 2024-01-04 DIAGNOSIS — Z8673 Personal history of transient ischemic attack (TIA), and cerebral infarction without residual deficits: Secondary | ICD-10-CM | POA: Diagnosis not present

## 2024-01-04 DIAGNOSIS — Z79899 Other long term (current) drug therapy: Secondary | ICD-10-CM | POA: Diagnosis not present

## 2024-01-04 DIAGNOSIS — Z91041 Radiographic dye allergy status: Secondary | ICD-10-CM | POA: Diagnosis not present

## 2024-01-04 DIAGNOSIS — R262 Difficulty in walking, not elsewhere classified: Secondary | ICD-10-CM | POA: Diagnosis not present

## 2024-01-04 DIAGNOSIS — Z9221 Personal history of antineoplastic chemotherapy: Secondary | ICD-10-CM | POA: Diagnosis not present

## 2024-01-04 DIAGNOSIS — K449 Diaphragmatic hernia without obstruction or gangrene: Secondary | ICD-10-CM | POA: Diagnosis present

## 2024-01-04 DIAGNOSIS — I129 Hypertensive chronic kidney disease with stage 1 through stage 4 chronic kidney disease, or unspecified chronic kidney disease: Secondary | ICD-10-CM | POA: Diagnosis present

## 2024-01-04 DIAGNOSIS — E785 Hyperlipidemia, unspecified: Secondary | ICD-10-CM | POA: Diagnosis present

## 2024-01-04 DIAGNOSIS — Z853 Personal history of malignant neoplasm of breast: Secondary | ICD-10-CM | POA: Diagnosis not present

## 2024-01-04 DIAGNOSIS — I10 Essential (primary) hypertension: Secondary | ICD-10-CM | POA: Diagnosis not present

## 2024-01-04 DIAGNOSIS — K529 Noninfective gastroenteritis and colitis, unspecified: Secondary | ICD-10-CM | POA: Diagnosis present

## 2024-01-04 DIAGNOSIS — R531 Weakness: Secondary | ICD-10-CM | POA: Diagnosis not present

## 2024-01-04 DIAGNOSIS — K2970 Gastritis, unspecified, without bleeding: Secondary | ICD-10-CM | POA: Diagnosis present

## 2024-01-04 DIAGNOSIS — N1831 Chronic kidney disease, stage 3a: Secondary | ICD-10-CM | POA: Diagnosis present

## 2024-01-04 DIAGNOSIS — Z8249 Family history of ischemic heart disease and other diseases of the circulatory system: Secondary | ICD-10-CM | POA: Diagnosis not present

## 2024-01-04 DIAGNOSIS — M810 Age-related osteoporosis without current pathological fracture: Secondary | ICD-10-CM | POA: Diagnosis present

## 2024-01-04 LAB — BASIC METABOLIC PANEL WITH GFR
Anion gap: 4 — ABNORMAL LOW (ref 5–15)
BUN: 11 mg/dL (ref 8–23)
CO2: 21 mmol/L — ABNORMAL LOW (ref 22–32)
Calcium: 7.5 mg/dL — ABNORMAL LOW (ref 8.9–10.3)
Chloride: 114 mmol/L — ABNORMAL HIGH (ref 98–111)
Creatinine, Ser: 1.1 mg/dL — ABNORMAL HIGH (ref 0.44–1.00)
GFR, Estimated: 52 mL/min — ABNORMAL LOW (ref >60)
Glucose, Bld: 76 mg/dL (ref 70–99)
Potassium: 3.3 mmol/L — ABNORMAL LOW (ref 3.5–5.1)
Sodium: 139 mmol/L (ref 135–145)

## 2024-01-04 LAB — CBC
HCT: 27.5 % — ABNORMAL LOW (ref 36.0–46.0)
Hemoglobin: 9.2 g/dL — ABNORMAL LOW (ref 12.0–15.0)
MCH: 33.8 pg (ref 26.0–34.0)
MCHC: 33.5 g/dL (ref 30.0–36.0)
MCV: 101.1 fL — ABNORMAL HIGH (ref 80.0–100.0)
Platelets: 219 K/uL (ref 150–400)
RBC: 2.72 MIL/uL — ABNORMAL LOW (ref 3.87–5.11)
RDW: 14.2 % (ref 11.5–15.5)
WBC: 9 K/uL (ref 4.0–10.5)
nRBC: 0 % (ref 0.0–0.2)

## 2024-01-04 LAB — MAGNESIUM: Magnesium: 1.7 mg/dL (ref 1.7–2.4)

## 2024-01-04 MED ORDER — ENSURE PLUS HIGH PROTEIN PO LIQD
237.0000 mL | Freq: Three times a day (TID) | ORAL | Status: DC
  Administered 2024-01-09: 237 mL via ORAL

## 2024-01-04 MED ORDER — ENSURE PLUS HIGH PROTEIN PO LIQD: 237.0000 mL | Freq: Two times a day (BID) | ORAL | Status: DC

## 2024-01-04 MED ORDER — POTASSIUM CHLORIDE 20 MEQ PO PACK
40.0000 meq | PACK | Freq: Every day | ORAL | Status: DC
  Administered 2024-01-04 – 2024-01-11 (×8): 40 meq via ORAL
  Filled 2024-01-04 (×8): qty 2

## 2024-01-04 MED ORDER — LOPERAMIDE HCL 2 MG PO CAPS
2.0000 mg | ORAL_CAPSULE | Freq: Once | ORAL | Status: AC
  Administered 2024-01-04: 2 mg via ORAL
  Filled 2024-01-04: qty 1

## 2024-01-04 NOTE — Progress Notes (Signed)
  PROGRESS NOTE    Jenna Carlson  FMW:[id number] DOB: [birth year] DOA: 01/02/2024 PCP: Lenon Layman ORN, MD  111A/111A-AA  LOS: 0 days   Brief hospital course:   Assessment & Plan: Jenna Carlson is a 73 y.o. female with medical history significant for Hypertension, asthma, breast cancer s/p chemo, history of PUD and esophageal stenosis with last dilatation 07/2022 being admitted with intractable nausea and vomiting.  She was admitted for the same a few weeks prior.  She states that pills get stuck in her throat.  She denies abdominal pain.  Has baseline chronic diarrhea for which she sees a gastroenterologist and has an upcoming colonoscopy.    * nausea and vomiting Dysphagia with history of esophageal stenosis s/p dilatation History of peptic ulcer disease --plan for esophageal dilation on Monday --hold home plavix  --Full liquid diet for now --cont PPI  AKI, ruled out CKD 3a --does not meet criteria   Hypokalemia --monitor and supplement PRN  Personal history of chemotherapy History of breast cancer No acute issues suspected  Asthma, chronic Not acutely exacerbated Albuterol  as needed  Essential hypertension --cont coreg  and Lisinopril   Chronic diarrhea Followed by GI.  Has upcoming colonoscopy --C diff and GI path   DVT prophylaxis: Lovenox  SQ Code Status: DNR  Family Communication:  Level of care: Med-Surg Dispo:   The patient is from: home Anticipated d/c is to: SNF rehab Anticipated d/c date is: Monday or after   Subjective and Interval History:  No vomiting today.  Reported diarrhea started today.   Objective: Vitals:   01/03/24 2115 01/04/24 0533 01/04/24 0851 01/04/24 1708  BP: (!) 154/90 (!) 144/87 (!) 144/92 124/83  Pulse: 71 69 72 69  Resp: 18 17 18    Temp: 97.6 F (36.4 C) 97.6 F (36.4 C) 98.3 F (36.8 C) 98 F (36.7 C)  TempSrc:  Oral    SpO2: 99% 97% 98% 97%  Weight:      Height:        Intake/Output Summary (Last 24  hours) at 01/04/2024 2046 Last data filed at 01/04/2024 1415 Gross per 24 hour  Intake 180 ml  Output --  Net 180 ml   Filed Weights   01/02/24 1918  Weight: 70.3 kg    Examination:   Constitutional: NAD, AAOx3 HEENT: conjunctivae and lids normal, EOMI CV: No cyanosis.   RESP: normal respiratory effort, on RA Neuro: II - XII grossly intact.   Psych: Normal mood and affect.  Appropriate judgement and reason   Data Reviewed: I have personally reviewed labs and imaging studies  Time spent: 50 minutes  Ellouise Haber, MD [facility] If 7PM-7AM, please contact night-coverage 01/04/2024, 8:46 PM

## 2024-01-04 NOTE — Care Management Obs Status (Signed)
 MEDICARE OBSERVATION STATUS NOTIFICATION   Patient Details  Name: Jenna Carlson MRN: [id number] Date of Birth: [birth year]   Medicare Observation Status Notification Given:  Yes    Keneisha Heckart W, CMA 01/04/2024, 10:45 AM

## 2024-01-04 NOTE — Progress Notes (Signed)
 Rogelia Copping, MD [facility]   [address] [place], [place] [address] Phone: [phone] Fax : [phone]   Subjective: The patient has no complaints today.  She states that she had some nausea and vomiting with her food yesterday and was switched to a clear liquid diet which she has been tolerating.  The patient is not sure when she is going home and is questioning whether she is having this procedure done as an outpatient or inpatient.   Objective: Vital signs in last 24 hours: Vitals:   01/03/24 1805 01/03/24 2115 01/04/24 0533 01/04/24 0851  BP: (!) 140/84 (!) 154/90 (!) 144/87 (!) 144/92  Pulse: 73 71 69 72  Resp: 18 18 17 18   Temp: 99.6 F (37.6 C) 97.6 F (36.4 C) 97.6 F (36.4 C) 98.3 F (36.8 C)  TempSrc:   Oral   SpO2: 99% 99% 97% 98%  Weight:      Height:       Weight change:   Intake/Output Summary (Last 24 hours) at 01/04/2024 0901 Last data filed at 01/03/2024 1238 Gross per 24 hour  Intake 1002.05 ml  Output --  Net 1002.05 ml     Exam: Heart:: Regular rate and rhythm or without murmur or extra heart sounds Lungs: normal and clear to auscultation and percussion Abdomen: soft, nontender, normal bowel sounds   Lab Results: @LABTEST2 @ Micro Results: No results found for this or any previous visit (from the past 240 hours). Studies/Results: CT ABDOMEN PELVIS WO CONTRAST Result Date: 01/03/2024 CLINICAL DATA:  Acute abdominal pain and weakness, initial encounter EXAM: CT ABDOMEN AND PELVIS WITHOUT CONTRAST TECHNIQUE: Multidetector CT imaging of the abdomen and pelvis was performed following the standard protocol without IV contrast. RADIATION DOSE REDUCTION: This exam was performed according to the departmental dose-optimization program which includes automated exposure control, adjustment of the mA and/or kV according to patient size and/or use of iterative reconstruction technique. COMPARISON:  01/23/2017 FINDINGS: Lower chest: Lung bases are  free of acute infiltrate or sizable effusion. A small ground-glass nodule is noted best seen on image number 3 of series 4 measuring 6.5 mm in greatest dimension. Hepatobiliary: No focal liver abnormality is seen. No gallstones, gallbladder wall thickening, or biliary dilatation. Pancreas: Unremarkable. No pancreatic ductal dilatation or surrounding inflammatory changes. Spleen: Normal in size without focal abnormality. Adrenals/Urinary Tract: Adrenal glands are within normal limits. Kidneys are well visualized bilaterally. No renal calculi or obstructive changes are seen. The ureter is within normal limits. Bladder is well distended. Stomach/Bowel: Scattered diverticular change of the colon is noted without evidence of diverticulitis. The appendix is within normal limits. No inflammatory changes are seen. Small bowel and stomach are unremarkable with the exception of a sliding-type hiatal hernia. Vascular/Lymphatic: Aortic atherosclerosis. No enlarged abdominal or pelvic lymph nodes. Reproductive: Uterus and bilateral adnexa are unremarkable. Other: No abdominal wall hernia or abnormality. No abdominopelvic ascites. Musculoskeletal: No acute or significant osseous findings. IMPRESSION: Diverticulosis without diverticulitis. Ground-glass pulmonary nodule measuring 7 mm. Per Fleischner Society Guidelines, recommend a non-contrast Chest CT at 6-12 months to confirm persistence, then additional non-contrast Chest CTs every 2 years until 5 years. If nodule grows or develops solid component(s), consider resection. These guidelines do not apply to immunocompromised patients and patients with cancer. Follow up in patients with significant comorbidities as clinically warranted. For lung cancer screening, adhere to Lung-RADS guidelines. Reference: Radiology. 2017; 284(1):228-43. Electronically Signed   By: Oneil Devonshire M.D.   On: 01/03/2024 00:50  Medications: I have reviewed the patient's current medications. Scheduled  Meds:  ARIPiprazole   5 mg Oral Daily   carvedilol   6.25 mg Oral BID WC   enoxaparin  (LOVENOX ) injection  40 mg Subcutaneous Q24H   feeding supplement  237 mL Oral BID BM   lisinopril   20 mg Oral Daily   pantoprazole   40 mg Oral Daily   QUEtiapine   25 mg Oral QHS   rosuvastatin   20 mg Oral Daily   Continuous Infusions: PRN Meds:.acetaminophen  **OR** acetaminophen , albuterol , HYDROcodone -acetaminophen , ondansetron  **OR** ondansetron  (ZOFRAN ) IV   Assessment: Principal Problem:   Intractable nausea and vomiting Active Problems:   Hypokalemia   Chronic diarrhea   AKI (acute kidney injury) (HCC)   Essential hypertension   Asthma, chronic   Personal history of chemotherapy   History of esophageal stenosis s/p dilatation   History of peptic ulcer    Plan: This patient has a esophageal stricture with dysphagia but is presently on Plavix .  The patient is having the Plavix  held for 5 days.  She is questioning whether she will be here for the procedure after being told that the earliest that could be done is Monday.  I have told her that we can do it on Monday if she is still here otherwise she can have it done as an outpatient.  I will leave that determination to the hospitalists as to whether the patient will go home or stay here.  If she does stay then the procedure will be done on Monday likely in the late morning or afternoon.   LOS: 0 days   Rogelia Copping, MD.FACG 01/04/2024, 9:01 AM Pager [phone] 7am-5pm  Check AMION for 5pm -7am coverage and on weekends

## 2024-01-04 NOTE — Progress Notes (Signed)
 Physical Therapy Treatment Patient Details Name: Jenna Carlson MRN: [id number] DOB: 1950-06-28 Today's Date: 01/04/2024   History of Present Illness Jenna Carlson is a 73 y.o. female with medical history significant for Hypertension, asthma, breast cancer s/p chemo, history of PUD and esophageal stenosis with last dilatation 07/2022 being admitted with intractable nausea and vomiting.  She was admitted for the same a few weeks prior.  She states that pills get stuck in her throat.  She denies abdominal pain.  Has baseline chronic diarrhea for which she sees a gastroenterologist and has an upcoming colonoscopy.    PT Comments  Pt received upright in bed agreeable to PT. Reports N/V improved. Feels a little stronger than yesterday but still feels weak compared to baseline. Pt slow moving but exits bed mod-I with bed features. Reliant on EOB elevated and RW for STS at [facility]. Pt is able to complete ~108' of gait at a very slowed cadence, slightly kyphotic and keeps RW anterior to BOS but otherwise appears steady with use. Pt returns to recliner and completes 5xSTS. Pt's time is significantly reduced for age matched norms for community ambulating adults indicative of LE weakness and over all falls risk. Pt making progress in POC but anticipate continued need for skilled PT services < 3 hours/day at d/c due to living alone and limited support while remaining a high falls risk. Pt with all needs in reach.    If plan is discharge home, recommend the following: A little help with walking and/or transfers;A little help with bathing/dressing/bathroom;Assist for transportation;Help with stairs or ramp for entrance   Can travel by private vehicle     Yes  Equipment Recommendations  None recommended by PT    Recommendations for Other Services       Precautions / Restrictions Precautions Precautions: Fall Recall of Precautions/Restrictions: Intact Restrictions Weight Bearing Restrictions Per Provider  Order: No     Mobility  Bed Mobility Overal bed mobility: Needs Assistance Bed Mobility: Supine to Sit     Supine to sit: Modified independent (Device/Increase time)       Patient Response: Cooperative  Transfers Overall transfer level: Needs assistance Equipment used: Rolling walker (2 wheels) Transfers: Sit to/from Stand Sit to Stand: Contact guard assist, From elevated surface                Ambulation/Gait Ambulation/Gait assistance: Supervision Gait Distance (Feet): 108 Feet Assistive device: Rolling walker (2 wheels) Gait Pattern/deviations: Step-through pattern, Decreased step length - right, Decreased step length - left, Decreased stride length       General Gait Details: significantly slowed gait speed, keeps RW slightly anterior to BOS   Stairs             Wheelchair Mobility     Tilt Bed Tilt Bed Patient Response: Cooperative  Modified Rankin (Stroke Patients Only)       Balance Overall balance assessment: Needs assistance Sitting-balance support: Feet supported Sitting balance-Leahy Scale: Good     Standing balance support: Bilateral upper extremity supported, During functional activity, Reliant on assistive device for balance Standing balance-Leahy Scale: Fair Standing balance comment: use of RW                            Communication Communication Communication: No apparent difficulties  Cognition Arousal: Alert Behavior During Therapy: WFL for tasks assessed/performed   PT - Cognitive impairments: No apparent impairments  Following commands: Intact      Cueing Cueing Techniques: Verbal cues  Exercises Other Exercises Other Exercises: 5xSTS: 67 seconds, heavy BUE support on arm rests, minA at last rep. Significantly decreased for age matched norms (12.6 seconds for 7th decade of life)    General Comments General comments (skin integrity, edema, etc.): SPO2 100% RA and HR:  85-88 BPM      Pertinent Vitals/Pain Pain Assessment Pain Assessment: No/denies pain    Home Living                          Prior Function            PT Goals (current goals can now be found in the care plan section) Acute Rehab PT Goals Patient Stated Goal: to get stronger PT Goal Formulation: With patient Time For Goal Achievement: 01/17/24 Potential to Achieve Goals: Good Progress towards PT goals: Progressing toward goals    Frequency    Min 2X/week      PT Plan      Co-evaluation              AM-PAC PT 6 Clicks Mobility   Outcome Measure  Help needed turning from your back to your side while in a flat bed without using bedrails?: A Little Help needed moving from lying on your back to sitting on the side of a flat bed without using bedrails?: A Little Help needed moving to and from a bed to a chair (including a wheelchair)?: A Little Help needed standing up from a chair using your arms (e.g., wheelchair or bedside chair)?: A Lot Help needed to walk in hospital room?: A Little Help needed climbing 3-5 steps with a railing? : A Lot 6 Click Score: 16    End of Session Equipment Utilized During Treatment: Gait belt Activity Tolerance: Patient tolerated treatment well Patient left: in chair;with call bell/phone within reach Nurse Communication: Mobility status PT Visit Diagnosis: Other abnormalities of gait and mobility (R26.89);Muscle weakness (generalized) (M62.81);Difficulty in walking, not elsewhere classified (R26.2)     Time: 9086-9063 PT Time Calculation (min) (ACUTE ONLY): 23 min  Charges:    $Therapeutic Activity: 23-37 mins PT General Charges $$ ACUTE PT VISIT: 1 Visit                     Dorina HERO. Fairly IV, PT, DPT Physical Therapist- [facility] 01/04/2024, 10:03 AM

## 2024-01-05 DIAGNOSIS — R112 Nausea with vomiting, unspecified: Secondary | ICD-10-CM | POA: Diagnosis not present

## 2024-01-05 LAB — GASTROINTESTINAL PANEL BY PCR, STOOL (REPLACES STOOL CULTURE)

## 2024-01-05 LAB — C DIFFICILE QUICK SCREEN W PCR REFLEX
C Diff antigen: NEGATIVE
C Diff interpretation: NOT DETECTED
C Diff toxin: NEGATIVE

## 2024-01-05 LAB — BASIC METABOLIC PANEL WITH GFR
Anion gap: 5 (ref 5–15)
BUN: 9 mg/dL (ref 8–23)
CO2: 21 mmol/L — ABNORMAL LOW (ref 22–32)
Calcium: 7.6 mg/dL — ABNORMAL LOW (ref 8.9–10.3)
Chloride: 113 mmol/L — ABNORMAL HIGH (ref 98–111)
Creatinine, Ser: 1.19 mg/dL — ABNORMAL HIGH (ref 0.44–1.00)
GFR, Estimated: 48 mL/min — ABNORMAL LOW (ref >60)
Glucose, Bld: 81 mg/dL (ref 70–99)
Potassium: 3.7 mmol/L (ref 3.5–5.1)
Sodium: 139 mmol/L (ref 135–145)

## 2024-01-05 LAB — CBC
HCT: 27.2 % — ABNORMAL LOW (ref 36.0–46.0)
Hemoglobin: 8.9 g/dL — ABNORMAL LOW (ref 12.0–15.0)
MCH: 33.5 pg (ref 26.0–34.0)
MCHC: 32.7 g/dL (ref 30.0–36.0)
MCV: 102.3 fL — ABNORMAL HIGH (ref 80.0–100.0)
Platelets: 207 K/uL (ref 150–400)
RBC: 2.66 MIL/uL — ABNORMAL LOW (ref 3.87–5.11)
RDW: 14.2 % (ref 11.5–15.5)
WBC: 8.5 K/uL (ref 4.0–10.5)
nRBC: 0 % (ref 0.0–0.2)

## 2024-01-05 LAB — MAGNESIUM: Magnesium: 1.7 mg/dL (ref 1.7–2.4)

## 2024-01-05 MED ORDER — LOPERAMIDE HCL 2 MG PO CAPS
2.0000 mg | ORAL_CAPSULE | Freq: Two times a day (BID) | ORAL | Status: DC | PRN
  Administered 2024-01-05: 2 mg via ORAL
  Filled 2024-01-05: qty 1

## 2024-01-05 NOTE — Progress Notes (Signed)
  PROGRESS NOTE    Jenna Carlson  FMW:[id number] DOB: September 07, 1950 DOA: 01/02/2024 PCP: Lenon Layman ORN, MD  111A/111A-AA  LOS: 1 day   Brief hospital course:   Assessment & Plan: Jenna Carlson is a 72 y.o. female with medical history significant for Hypertension, asthma, breast cancer s/p chemo, history of PUD and esophageal stenosis with last dilatation 07/2022 being admitted with intractable nausea and vomiting.  She was admitted for the same a few weeks prior.  She states that pills get stuck in her throat.  She denies abdominal pain.  Has baseline chronic diarrhea for which she sees a gastroenterologist and has an upcoming colonoscopy.    * nausea and vomiting Dysphagia with history of esophageal stenosis s/p dilatation History of peptic ulcer disease --plan for esophageal dilation on Monday --hold home plavix  --soft diet, per pt request --cont PPI  AKI, ruled out CKD 3a --does not meet criteria   Hypokalemia --monitor and supplement PRN  Personal history of chemotherapy History of breast cancer No acute issues suspected  Asthma, chronic Not acutely exacerbated Albuterol  as needed  Essential hypertension --cont coreg  and Lisinopril   Chronic diarrhea Followed by GI.  Has upcoming colonoscopy --C diff and GI path neg --Imodium  PRN   DVT prophylaxis: Lovenox  SQ Code Status: DNR  Family Communication:  Level of care: Med-Surg Dispo:   The patient is from: home Anticipated d/c is to: SNF rehab Anticipated d/c date is: Monday or after   Subjective and Interval History:  Had 2 more episode of diarrhea.   Objective: Vitals:   01/04/24 2118 01/05/24 0351 01/05/24 0757 01/05/24 1443  BP: (!) 145/79 133/67 (!) 158/87 133/76  Pulse: 70 67 69 72  Resp:   17 16  Temp: 98.4 F (36.9 C) 97.6 F (36.4 C) 97.9 F (36.6 C) 97.8 F (36.6 C)  TempSrc: Oral Oral    SpO2: 100% 96% 97% 99%  Weight:      Height:        Intake/Output Summary (Last 24  hours) at 01/05/2024 1939 Last data filed at 01/05/2024 1605 Gross per 24 hour  Intake 360 ml  Output --  Net 360 ml   Filed Weights   01/02/24 1918  Weight: 70.3 kg    Examination:   Constitutional: NAD, AAOx3 HEENT: conjunctivae and lids normal, EOMI CV: No cyanosis.   RESP: normal respiratory effort, on RA Neuro: II - XII grossly intact.   Psych: Normal mood and affect.  Appropriate judgement and reason   Data Reviewed: I have personally reviewed labs and imaging studies  Time spent: 35 minutes  Ellouise Haber, MD [facility] If 7PM-7AM, please contact night-coverage 01/05/2024, 7:39 PM

## 2024-01-05 NOTE — Plan of Care (Signed)

## 2024-01-06 DIAGNOSIS — R112 Nausea with vomiting, unspecified: Secondary | ICD-10-CM | POA: Diagnosis not present

## 2024-01-06 NOTE — Plan of Care (Signed)

## 2024-01-06 NOTE — Progress Notes (Signed)
  PROGRESS NOTE    Jenna Carlson  FMW:[id number] DOB: Apr 23, 1951 DOA: 01/02/2024 PCP: Lenon Layman ORN, MD  111A/111A-AA  LOS: 2 days   Brief hospital course:   Assessment & Plan: Jenna Carlson is a 73 y.o. female with medical history significant for Hypertension, asthma, breast cancer s/p chemo, history of PUD and esophageal stenosis with last dilatation 07/2022 being admitted with intractable nausea and vomiting.  She was admitted for the same a few weeks prior.  She states that pills get stuck in her throat.  She denies abdominal pain.  Has baseline chronic diarrhea for which she sees a gastroenterologist and has an upcoming colonoscopy.    * nausea and vomiting Dysphagia with history of esophageal stenosis s/p dilatation History of peptic ulcer disease --plan for esophageal dilation on Monday --hold home plavix  --soft diet, per pt request --cont PPI  AKI, ruled out CKD 3a --does not meet criteria   Hypokalemia --monitor and supplement PRN  Personal history of chemotherapy History of breast cancer No acute issues suspected  Asthma, chronic Not acutely exacerbated Albuterol  as needed  Essential hypertension --cont coreg  and Lisinopril   Chronic diarrhea Followed by GI.  Has upcoming colonoscopy --C diff and GI path neg --Imodium  PRN   DVT prophylaxis: Lovenox  SQ Code Status: DNR  Family Communication:  Level of care: Med-Surg Dispo:   The patient is from: home Anticipated d/c is to: SNF rehab Anticipated d/c date is: after Monday    Subjective and Interval History:  Diarrhea improved.   Objective: Vitals:   01/05/24 2143 01/06/24 0457 01/06/24 0747 01/06/24 1632  BP: (!) 159/86 135/81 (!) 154/99 (!) 153/86  Pulse: 65 73 70 64  Resp:   18 18  Temp: 97.7 F (36.5 C) 97.8 F (36.6 C) (!) 97.5 F (36.4 C) 98.2 F (36.8 C)  TempSrc: Oral Oral    SpO2: 99% 98% 98% 98%  Weight:      Height:       No intake or output data in the 24 hours  ending 01/06/24 1649  Filed Weights   01/02/24 1918  Weight: 70.3 kg    Examination:   Constitutional: NAD, AAOx3 HEENT: conjunctivae and lids normal, EOMI CV: No cyanosis.   RESP: normal respiratory effort, on RA Neuro: II - XII grossly intact.   Psych: Normal mood and affect.  Appropriate judgement and reason   Data Reviewed: I have personally reviewed labs and imaging studies  Time spent: 25 minutes  Ellouise Haber, MD [facility] If 7PM-7AM, please contact night-coverage 01/06/2024, 4:49 PM

## 2024-01-06 NOTE — Plan of Care (Signed)
  Problem: Education: Goal: Knowledge of General Education information will improve Description: Including pain rating scale, medication(s)/side effects and non-pharmacologic comfort measures Outcome: Progressing   Problem: Health Behavior/Discharge Planning: Goal: Ability to manage health-related needs will improve Outcome: Progressing   Problem: Clinical Measurements: Goal: Ability to maintain clinical measurements within normal limits will improve Outcome: Progressing Goal: Will remain free from infection Outcome: Progressing Goal: Diagnostic test results will improve Outcome: Progressing Goal: Respiratory complications will improve Outcome: Progressing Goal: Cardiovascular complication will be avoided Outcome: Progressing   Problem: Activity: Goal: Risk for activity intolerance will decrease Outcome: Progressing   Problem: Coping: Goal: Level of anxiety will decrease Outcome: Progressing   Problem: Pain Managment: Goal: General experience of comfort will improve and/or be controlled Outcome: Progressing   Problem: Safety: Goal: Ability to remain free from injury will improve Outcome: Progressing   Problem: Skin Integrity: Goal: Risk for impaired skin integrity will decrease Outcome: Progressing

## 2024-01-07 ENCOUNTER — Inpatient Hospital Stay: Admitting: Anesthesiology

## 2024-01-07 ENCOUNTER — Encounter
Admission: EM | Disposition: A | Discharge status: Home Health | Payer: Self-pay | Source: Home / Self Care | Attending: Hospitalist

## 2024-01-07 ENCOUNTER — Encounter: Payer: Self-pay | Admitting: Hospitalist

## 2024-01-07 DIAGNOSIS — K449 Diaphragmatic hernia without obstruction or gangrene: Secondary | ICD-10-CM | POA: Diagnosis not present

## 2024-01-07 DIAGNOSIS — K222 Esophageal obstruction: Secondary | ICD-10-CM | POA: Diagnosis not present

## 2024-01-07 DIAGNOSIS — R112 Nausea with vomiting, unspecified: Secondary | ICD-10-CM | POA: Diagnosis not present

## 2024-01-07 DIAGNOSIS — K2970 Gastritis, unspecified, without bleeding: Secondary | ICD-10-CM | POA: Diagnosis not present

## 2024-01-07 HISTORY — PX: ESOPHAGOGASTRODUODENOSCOPY: SHX5428

## 2024-01-07 HISTORY — PX: ESOPHAGEAL DILATION: SHX303

## 2024-01-07 LAB — CBC
HCT: 34 % — ABNORMAL LOW (ref 36.0–46.0)
Hemoglobin: 10.5 g/dL — ABNORMAL LOW (ref 12.0–15.0)
MCH: 32.6 pg (ref 26.0–34.0)
MCHC: 30.9 g/dL (ref 30.0–36.0)
MCV: 105.6 fL — ABNORMAL HIGH (ref 80.0–100.0)
Platelets: 223 K/uL (ref 150–400)
RBC: 3.22 MIL/uL — ABNORMAL LOW (ref 3.87–5.11)
RDW: 14.2 % (ref 11.5–15.5)
WBC: 9 K/uL (ref 4.0–10.5)
nRBC: 0 % (ref 0.0–0.2)

## 2024-01-07 LAB — BASIC METABOLIC PANEL WITH GFR
Anion gap: 10 (ref 5–15)
BUN: <5 mg/dL — ABNORMAL LOW (ref 8–23)
CO2: 22 mmol/L (ref 22–32)
Calcium: 8.2 mg/dL — ABNORMAL LOW (ref 8.9–10.3)
Chloride: 105 mmol/L (ref 98–111)
Creatinine, Ser: 1.01 mg/dL — ABNORMAL HIGH (ref 0.44–1.00)
GFR, Estimated: 58 mL/min — ABNORMAL LOW (ref >60)
Glucose, Bld: 80 mg/dL (ref 70–99)
Potassium: 3.9 mmol/L (ref 3.5–5.1)
Sodium: 137 mmol/L (ref 135–145)

## 2024-01-07 LAB — MAGNESIUM: Magnesium: 1.7 mg/dL (ref 1.7–2.4)

## 2024-01-07 SURGERY — EGD (ESOPHAGOGASTRODUODENOSCOPY)
Anesthesia: General

## 2024-01-07 MED ORDER — LIDOCAINE HCL (CARDIAC) PF 100 MG/5ML IV SOSY
PREFILLED_SYRINGE | INTRAVENOUS | Status: DC | PRN
  Administered 2024-01-07 (×2): 100 mg via INTRAVENOUS

## 2024-01-07 MED ORDER — PROPOFOL 10 MG/ML IV BOLUS
INTRAVENOUS | Status: DC | PRN
  Administered 2024-01-07: 20 mg via INTRAVENOUS
  Administered 2024-01-07: 40 mg via INTRAVENOUS
  Administered 2024-01-07: 20 mg via INTRAVENOUS

## 2024-01-07 MED ORDER — SODIUM CHLORIDE 0.9 % IV SOLN: INTRAVENOUS | Status: DC

## 2024-01-07 MED ORDER — ENOXAPARIN SODIUM 40 MG/0.4ML IJ SOSY
40.0000 mg | PREFILLED_SYRINGE | INTRAMUSCULAR | Status: DC
  Administered 2024-01-09 – 2024-01-10 (×2): 40 mg via SUBCUTANEOUS
  Filled 2024-01-07 (×2): qty 0.4

## 2024-01-07 NOTE — Anesthesia Postprocedure Evaluation (Signed)
 Anesthesia Post Note  Patient: Jenna Carlson  Procedure(s) Performed: EGD (ESOPHAGOGASTRODUODENOSCOPY)  Patient location during evaluation: Endoscopy Anesthesia Type: General Level of consciousness: awake and alert Pain management: pain level controlled Vital Signs Assessment: post-procedure vital signs reviewed and stable Respiratory status: spontaneous breathing, nonlabored ventilation and respiratory function stable Cardiovascular status: blood pressure returned to baseline and stable Postop Assessment: no apparent nausea or vomiting Anesthetic complications: no   There were no known notable events for this encounter.   Last Vitals:  Vitals:   01/07/24 1152 01/07/24 1236  BP: (!) 140/85 (!) 147/83  Pulse: 65 69  Resp: 13 18  Temp:  36.8 C  SpO2: 98% 96%    Last Pain:  Vitals:   01/07/24 1152  TempSrc:   PainSc: 0-No pain                 Camellia Merilee Louder

## 2024-01-07 NOTE — Op Note (Signed)
 [facility] Gastroenterology Patient Name: Jenna Carlson Procedure Date: 01/07/2024 11:02 AM MRN: [id number] Account #: [id number] Date of Birth: [birth year] Admit Type: Inpatient Age: 73 Room: [facility] ENDO ROOM 4 Gender: Female Note Status: Finalized Instrument Name: Barnie Endoscope [id number] Procedure:             Upper GI endoscopy Indications:           Dysphagia Providers:             Rogelia Copping MD, MD Medicines:             Propofol  per Anesthesia Complications:         No immediate complications. Procedure:             Pre-Anesthesia Assessment:                        - Prior to the procedure, a History and Physical was                         performed, and patient medications and allergies were                         reviewed. The patient's tolerance of previous                         anesthesia was also reviewed. The risks and benefits                         of the procedure and the sedation options and risks                         were discussed with the patient. All questions were                         answered, and informed consent was obtained. Prior                         Anticoagulants: The patient has taken no anticoagulant                         or antiplatelet agents. ASA Grade Assessment: II - A                         patient with mild systemic disease. After reviewing                         the risks and benefits, the patient was deemed in                         satisfactory condition to undergo the procedure.                        After obtaining informed consent, the endoscope was                         passed under direct vision. Throughout the procedure,                         the patient's blood pressure,  pulse, and oxygen                         saturations were monitored continuously. The Endoscope                         was introduced through the mouth, and advanced to the                         second part of duodenum. The  upper GI endoscopy was                         accomplished without difficulty. The patient tolerated                         the procedure well. Findings:      A medium-sized hiatal hernia was present.      One benign-appearing, intrinsic moderate stenosis was found at the       gastroesophageal junction. The stenosis was traversed. A TTS dilator was       passed through the scope. Dilation with a 12-13.5-15 mm balloon dilator       was performed to 15 mm. The dilation site was examined following       endoscope reinsertion and showed moderate improvement in luminal       narrowing.      Mild inflammation was found in the gastric antrum.      The examined duodenum was normal. Impression:            - Medium-sized hiatal hernia.                        - Benign-appearing esophageal stenosis. Dilated.                        - Gastritis.                        - Normal examined duodenum.                        - No specimens collected. Recommendation:        - Return patient to hospital ward for ongoing care.                        - Resume previous diet.                        - Continue present medications.                        - Repeat upper endoscopy in 4 weeks for retreatment                         with Dr. Onita.                        - Can restart anticoagulation in 24 hours Procedure Code(s):     --- Professional ---                        [phone], Esophagogastroduodenoscopy, flexible,  transoral; with transendoscopic balloon dilation of                         esophagus (less than 30 mm diameter) Diagnosis Code(s):     --- Professional ---                        R13.10, Dysphagia, unspecified                        K22.2, Esophageal obstruction CPT copyright 2022 American Medical Association. All rights reserved. The codes documented in this report are preliminary and upon coder review may  be revised to meet current compliance requirements. Rogelia Copping MD, MD 01/07/2024 11:24:00 AM This report has been signed electronically. Number of Addenda: 0 Note Initiated On: 01/07/2024 11:02 AM Estimated Blood Loss:  Estimated blood loss: none.      [facility]

## 2024-01-07 NOTE — Plan of Care (Signed)

## 2024-01-07 NOTE — Progress Notes (Signed)
 Patient transferred to endoscopy in bed at 1022 in stable condition.  Patient returned from endoscopy in bed in stable condition at 1208.

## 2024-01-07 NOTE — Progress Notes (Signed)
  PROGRESS NOTE    Jenna Carlson  FMW:[id number] DOB: 25-Jul-1950 DOA: 01/02/2024 PCP: Lenon Layman ORN, MD  111A/111A-AA  LOS: 3 days   Brief hospital course:   Assessment & Plan: Jenna Carlson is a 73 y.o. female with medical history significant for Hypertension, asthma, breast cancer s/p chemo, history of PUD and esophageal stenosis with last dilatation 07/2022 being admitted with intractable nausea and vomiting.  She was admitted for the same a few weeks prior.  She states that pills get stuck in her throat.  She denies abdominal pain.  Has baseline chronic diarrhea for which she sees a gastroenterologist and has an upcoming colonoscopy.    * nausea and vomiting Dysphagia with history of esophageal stenosis s/p dilatation History of peptic ulcer disease --esophageal dilation today --hold home plavix  --soft diet, per pt request --cont PPI --outpatient f/u with GI Dr. Onita  AKI, ruled out CKD 3a --does not meet criteria   Hypokalemia --supplement PRN  Personal history of chemotherapy History of breast cancer No acute issues suspected  Asthma, chronic Not acutely exacerbated Albuterol  as needed  Essential hypertension --cont coreg  and Lisinoipril  Chronic diarrhea Followed by GI.  Has upcoming colonoscopy --C diff and GI path neg --Imodium  PRN   DVT prophylaxis: Lovenox  SQ Code Status: DNR  Family Communication:  Level of care: Med-Surg Dispo:   The patient is from: home Anticipated d/c is to: SNF rehab Anticipated d/c date is: whenever SNF can accept   Subjective and Interval History:  Received esophageal dilation today.  Tolerated it well.  No pain.   Objective: Vitals:   01/07/24 1142 01/07/24 1152 01/07/24 1236 01/07/24 1422  BP: 127/81 (!) 140/85 (!) 147/83 (!) 155/85  Pulse: 73 65 69 74  Resp: 12 13 18 17   Temp:   98.2 F (36.8 C) 98.2 F (36.8 C)  TempSrc:      SpO2: 97% 98% 96% 100%  Weight:      Height:        Intake/Output  Summary (Last 24 hours) at 01/07/2024 1715 Last data filed at 01/07/2024 1600 Gross per 24 hour  Intake 340 ml  Output --  Net 340 ml    Filed Weights   01/02/24 1918  Weight: 70.3 kg    Examination:   Constitutional: NAD, AAOx3 HEENT: conjunctivae and lids normal, EOMI CV: No cyanosis.   RESP: normal respiratory effort, on RA Neuro: II - XII grossly intact.   Psych: Normal mood and affect.  Appropriate judgement and reason   Data Reviewed: I have personally reviewed labs and imaging studies  Time spent: 35 minutes  Ellouise Haber, MD [facility] If 7PM-7AM, please contact night-coverage 01/07/2024, 5:15 PM

## 2024-01-07 NOTE — Progress Notes (Signed)
 PT Cancellation Note:  Pt currently off unit for procedure.  Will re-attempt PT session at a later date/time.  Damien Caulk, PT 01/07/24, 10:58 AM

## 2024-01-07 NOTE — Anesthesia Preprocedure Evaluation (Addendum)
 Anesthesia Evaluation  Patient identified by MRN, date of birth, ID band Patient awake    Reviewed: Allergy & Precautions, H&P , NPO status , Patient's Chart, lab work & pertinent test results  Airway Mallampati: III  TM Distance: >3 FB Neck ROM: full    Dental  (+) Poor Dentition   Pulmonary asthma    Pulmonary exam normal        Cardiovascular hypertension, Normal cardiovascular exam  ECG 01/02/24:  Sinus tachycardia with Premature atrial complexes T wave abnormality, consider inferolateral ischemia   Neuro/Psych  PSYCHIATRIC DISORDERS  Depression    TIA (on Plavix )   GI/Hepatic Neg liver ROS, PUD,GERD  ,,Dysphagia with history of esophageal stenosis s/p dilatation   Endo/Other  negative endocrine ROS    Renal/GU Renal disease (stage III CKD)  negative genitourinary   Musculoskeletal   Abdominal   Peds  Hematology  (+) Blood dyscrasia, anemia Breast CA   Anesthesia Other Findings Past Medical History: No date: Allergic rhinitis No date: Asthma 1990's: Breast cancer (HCC)     Comment:  right breast No date: Cancer (HCC)     Comment:  BREAST No date: Depression No date: Edema No date: GERD (gastroesophageal reflux disease) No date: Hyperlipidemia No date: Hypertension 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, postmenopausal No date: Personal history of chemotherapy  Past Surgical History: 07/10/2022: COLONOSCOPY; N/A     Comment:  Procedure: COLONOSCOPY;  Surgeon: Onita Elspeth Sharper,              DO;  Location: [facility] ENDOSCOPY;  Service:               Gastroenterology;  Laterality: N/A; 01/15/2023: COLONOSCOPY; N/A     Comment:  Procedure: COLONOSCOPY;  Surgeon: Onita Elspeth Sharper,              DO;  Location: [facility] ENDOSCOPY;  Service:               Gastroenterology;  Laterality: N/A; 02/16/2017: COLONOSCOPY WITH PROPOFOL ; N/A     Comment:  Procedure: COLONOSCOPY WITH PROPOFOL ;  Surgeon: Viktoria Lamar DASEN,  MD;  Location: [facility] ENDOSCOPY;  Service:               Endoscopy;  Laterality: N/A; 07/10/2022: ESOPHAGOGASTRODUODENOSCOPY; N/A     Comment:  Procedure: ESOPHAGOGASTRODUODENOSCOPY (EGD);  Surgeon:               Onita Elspeth Sharper, DO;  Location: [facility] ENDOSCOPY;                Service: Gastroenterology;  Laterality: N/A; 07/24/2022: ESOPHAGOGASTRODUODENOSCOPY; N/A     Comment:  Procedure: ESOPHAGOGASTRODUODENOSCOPY (EGD);  Surgeon:               Onita Elspeth Sharper, DO;  Location: [facility] ENDOSCOPY;                Service: Gastroenterology;  Laterality: N/A; 02/16/2017: ESOPHAGOGASTRODUODENOSCOPY (EGD) WITH PROPOFOL ; N/A     Comment:  Procedure: ESOPHAGOGASTRODUODENOSCOPY (EGD) WITH               PROPOFOL ;  Surgeon: Viktoria Lamar DASEN, MD;  Location:               [facility] ENDOSCOPY;  Service: Endoscopy;  Laterality: N/A; 01/15/2023: HEMOSTASIS CLIP PLACEMENT     Comment:  Procedure: HEMOSTASIS CLIP PLACEMENT;  Surgeon: Onita,  Elspeth Sharper, DO;  Location: [facility] ENDOSCOPY;  Service:               Gastroenterology;; No date: MASTECTOMY 01/15/2023: POLYPECTOMY     Comment:  Procedure: POLYPECTOMY;  Surgeon: Onita Elspeth Sharper,              DO;  Location: [facility] ENDOSCOPY;  Service:               Gastroenterology;; 01/15/2023: ROBLEY LIFTING INJECTION     Comment:  Procedure: SUBMUCOSAL LIFTING INJECTION;  Surgeon:               Onita Elspeth Sharper, DO;  Location: [facility] ENDOSCOPY;                Service: Gastroenterology;; No date: WRIST SURGERY; Left  BMI    Body Mass Index: 25.79 kg/m      Reproductive/Obstetrics negative OB ROS                              Anesthesia Physical Anesthesia Plan  ASA: 3  Anesthesia Plan: General   Post-op Pain Management: Minimal or no pain anticipated   Induction: Intravenous  PONV Risk Score and Plan: Propofol  infusion and TIVA  Airway Management Planned: Natural Airway  Additional Equipment:    Intra-op Plan:   Post-operative Plan:   Informed Consent: I have reviewed the patients History and Physical, chart, labs and discussed the procedure including the risks, benefits and alternatives for the proposed anesthesia with the patient or authorized representative who has indicated his/her understanding and acceptance.   Patient has DNR.   Dental Advisory Given  Plan Discussed with: CRNA and Surgeon  Anesthesia Plan Comments:          Anesthesia Quick Evaluation

## 2024-01-07 NOTE — Care Management Important Message (Signed)
 Important Message  Patient Details  Name: BEKKI TAVENNER MRN: [id number] Date of Birth: November 29, 1950   Important Message Given:  Yes - Medicare IM     Mykeisha Dysert W, CMA 01/07/2024, 12:25 PM

## 2024-01-07 NOTE — Transfer of Care (Signed)
 Immediate Anesthesia Transfer of Care Note  Patient: Jenna Carlson  Procedure(s) Performed: EGD (ESOPHAGOGASTRODUODENOSCOPY)  Patient Location: PACU  Anesthesia Type:General  Level of Consciousness: drowsy and patient cooperative  Airway & Oxygen Therapy: Patient Spontanous Breathing and Patient connected to nasal cannula oxygen  Post-op Assessment: Report given to RN and Post -op Vital signs reviewed and stable  Post vital signs: stable on 2L via [place]  Last Vitals:  Vitals Value Taken Time  BP 108/66 01/07/24 11:21  Temp 35.9 C 01/07/24 11:21  Pulse 70 01/07/24 11:24  Resp 17 01/07/24 11:24  SpO2 96 % 01/07/24 11:24  Vitals shown include unfiled device data.  Last Pain:  Vitals:   01/07/24 1121  TempSrc: Temporal  PainSc: 0-No pain         Complications: No notable events documented.

## 2024-01-08 ENCOUNTER — Encounter: Payer: Self-pay | Admitting: Gastroenterology

## 2024-01-08 DIAGNOSIS — R112 Nausea with vomiting, unspecified: Secondary | ICD-10-CM | POA: Diagnosis not present

## 2024-01-08 MED ORDER — CLOPIDOGREL BISULFATE 75 MG PO TABS
75.0000 mg | ORAL_TABLET | Freq: Every day | ORAL | Status: DC
  Administered 2024-01-09 – 2024-01-11 (×3): 75 mg via ORAL
  Filled 2024-01-08 (×3): qty 1

## 2024-01-08 MED ORDER — DOCUSATE SODIUM 100 MG PO CAPS
100.0000 mg | ORAL_CAPSULE | Freq: Two times a day (BID) | ORAL | Status: DC | PRN
  Administered 2024-01-11: 100 mg via ORAL
  Filled 2024-01-08: qty 1

## 2024-01-08 NOTE — Plan of Care (Signed)

## 2024-01-08 NOTE — NC FL2 (Signed)
 [place]  MEDICAID [id number] LEVEL OF CARE FORM     IDENTIFICATION  Patient Name: Jenna Carlson Birthdate: 1950-07-29 Sex: female Admission Date (Current Location): 01/02/2024  [facility] and [place] Number:  Chiropodist and Address:  [facility], [address] [place], [place] [address]      Provider Number: [id number]  Attending Physician Name and Address:  Awanda City, MD  Relative Name and Phone Number:  Dorthea Lesch [phone]    Current Level of Care: Hospital Recommended Level of Care: Skilled Nursing Facility Prior Approval Number:    Date Approved/Denied:   PASRR Number: [id number]  Discharge Plan: SNF    Current Diagnoses: Patient Active Problem List   Diagnosis Date Noted   Stricture and stenosis of esophagus 01/07/2024   Dysphagia 01/04/2024   Intractable nausea and vomiting 01/03/2024   History of esophageal stenosis s/p dilatation 01/03/2024   History of peptic ulcer 01/03/2024   Personal history of chemotherapy    Dyslipidemia 12/19/2023   Major depression 12/19/2023   Acute lower UTI 11/16/2023   AKI (acute kidney injury) (HCC) 11/16/2023   Essential hypertension 11/16/2023   GERD without esophagitis 11/16/2023   Depression 11/16/2023   Asthma, chronic 11/16/2023   Generalized weakness 11/15/2023   Elevated gastrin level 06/01/2017   Chronic diarrhea    Weakness 05/16/2017   Hypokalemia 05/16/2017   Multiple duodenal ulcers 05/16/2017   HTN (hypertension) 05/16/2017   HLD (hyperlipidemia) 05/16/2017    Orientation RESPIRATION BLADDER Height & Weight     Self, Time, Situation    Continent Weight: 70.3 kg Height:  5' 5 (165.1 cm)  BEHAVIORAL SYMPTOMS/MOOD NEUROLOGICAL BOWEL NUTRITION STATUS      Continent Diet (Soft)  AMBULATORY STATUS COMMUNICATION OF NEEDS Skin   Limited Assist Verbally                         Personal Care Assistance Level of Assistance  Bathing, Feeding, Dressing Bathing  Assistance: Limited assistance Feeding assistance: Independent Dressing Assistance: Limited assistance     Functional Limitations Info  Hearing, Speech, Sight Sight Info: Adequate Hearing Info: Adequate Speech Info: Adequate    SPECIAL CARE FACTORS FREQUENCY                       Contractures      Additional Factors Info  Code Status, Allergies Code Status Info: DNR Allergies Info: Iodine, ASA, Codeine, HCTZ, Morphine and Codeine           Current Medications (01/08/2024):  This is the current hospital active medication list Current Facility-Administered Medications  Medication Dose Route Frequency Provider Last Rate Last Admin   acetaminophen  (TYLENOL ) tablet 650 mg  650 mg Oral Q6H PRN Jinny Carmine, MD       Or   acetaminophen  (TYLENOL ) suppository 650 mg  650 mg Rectal Q6H PRN Jinny Carmine, MD       albuterol  (PROVENTIL ) (2.5 MG/3ML) 0.083% nebulizer solution 2.5 mg  2.5 mg Inhalation Q4H PRN Jinny Carmine, MD       ARIPiprazole  (ABILIFY ) tablet 5 mg  5 mg Oral Daily Jinny Carmine, MD   5 mg at 01/08/24 0843   carvedilol  (COREG ) tablet 6.25 mg  6.25 mg Oral BID WC Jinny Carmine, MD   6.25 mg at 01/08/24 0843   [START ON 01/09/2024] enoxaparin  (LOVENOX ) injection 40 mg  40 mg Subcutaneous Q24H Jinny Carmine, MD       feeding  supplement (ENSURE PLUS HIGH PROTEIN) liquid 237 mL  237 mL Oral TID BM Jinny Carmine, MD       HYDROcodone -acetaminophen  (NORCO/VICODIN) 5-325 MG per tablet 1-2 tablet  1-2 tablet Oral Q4H PRN Jinny Carmine, MD       lisinopril  (ZESTRIL ) tablet 20 mg  20 mg Oral Daily Wohl, Darren, MD   20 mg at 01/08/24 9155   loperamide  (IMODIUM ) capsule 2 mg  2 mg Oral BID PRN Jinny Carmine, MD   2 mg at 01/05/24 1801   ondansetron  (ZOFRAN ) tablet 4 mg  4 mg Oral Q6H PRN Jinny Carmine, MD       Or   ondansetron  (ZOFRAN ) injection 4 mg  4 mg Intravenous Q6H PRN Jinny Carmine, MD   4 mg at 01/03/24 1358   pantoprazole  (PROTONIX ) EC tablet 40 mg  40 mg Oral Daily Wohl,  Darren, MD   40 mg at 01/08/24 0844   potassium chloride  (KLOR-CON ) packet 40 mEq  40 mEq Oral Daily Jinny Carmine, MD   40 mEq at 01/08/24 0840   QUEtiapine  (SEROQUEL ) tablet 25 mg  25 mg Oral QHS Jinny Carmine, MD   25 mg at 01/07/24 2201   rosuvastatin  (CRESTOR ) tablet 20 mg  20 mg Oral Daily Wohl, Darren, MD   20 mg at 01/08/24 9155     Discharge Medications: Please see discharge summary for a list of discharge medications.  Relevant Imaging Results:  Relevant Lab Results:   Additional Information SS# [id number]  Dalia GORMAN Fuse, RN

## 2024-01-08 NOTE — Progress Notes (Signed)
 Physical Therapy Treatment Patient Details Name: Jenna Carlson MRN: [id number] DOB: [birth year] Today's Date: 01/08/2024   History of Present Illness Jenna Carlson is a 73 y.o. female with medical history significant for Hypertension, asthma, breast cancer s/p chemo, history of PUD and esophageal stenosis with last dilatation 07/2022 being admitted with intractable nausea and vomiting.  She was admitted for the same a few weeks prior.  She states that pills get stuck in her throat.  She denies abdominal pain.  Has baseline chronic diarrhea for which she sees a gastroenterologist and has an upcoming colonoscopy.    PT Comments  Pt is supine in bed with HOB elevated, and agreeable to therapy today. Pt is very motivated to continue improvements and mentioned desire for continued PT upon d/c to get stronger and feel safer at home. PT focused today's session on increasing activity tolerance and ambulation distance. During session pt was able to perform bed mobility (supine to sit) with mod independence, performed a STS from EOB to RW with CGA (from an elevated bed height) and was able to ambulate 139ft with RW and CGA (for increased safety). During ambulation bout, PT calculated gait speed (0.76 ft/s). Gait speed calculated is indicative of limited household ambulation and increased risk of falls. Despite increased ambulation distance noted in today's session, PT recommendations for d/c remain unchanged secondary to decreased tolerance to activity with reported fatigue following ambulation, decreased gait speed (indicative of increased risk of falling), and limited support at home. Pt would benefit from skilled PT to continue to work towards PT goals and return to PLOF.      If plan is discharge home, recommend the following: A little help with walking and/or transfers;A little help with bathing/dressing/bathroom;Assist for transportation;Help with stairs or ramp for entrance   Can travel by private  vehicle     Yes  Equipment Recommendations  None recommended by PT    Recommendations for Other Services       Precautions / Restrictions Precautions Precautions: Fall Recall of Precautions/Restrictions: Intact Restrictions Weight Bearing Restrictions Per Provider Order: No     Mobility  Bed Mobility Overal bed mobility: Modified Independent Bed Mobility: Supine to Sit     Supine to sit: Modified independent (Device/Increase time)          Transfers Overall transfer level: Needs assistance Equipment used: Rolling walker (2 wheels) Transfers: Sit to/from Stand Sit to Stand: Contact guard assist, From elevated surface           General transfer comment: 1x STS from bed elevated ~5 inches.    Ambulation/Gait Ambulation/Gait assistance: Contact guard assist (for safety) Gait Distance (Feet): 195 Feet Assistive device: Rolling walker (2 wheels) Gait Pattern/deviations: Step-through pattern, Decreased step length - right, Decreased step length - left, Decreased stride length, Narrow base of support Gait velocity: decreased, 0.76 ft/sec. Indicative of increased falls. Gait velocity interpretation: <1.31 ft/sec, indicative of household ambulator   General Gait Details: Steady with BUE supported on RW, has difficulty with dual tasks, often requireing a stop to talk to staff   Stairs             Wheelchair Mobility     Tilt Bed    Modified Rankin (Stroke Patients Only)       Balance Overall balance assessment: Needs assistance Sitting-balance support: Feet supported Sitting balance-Leahy Scale: Good Sitting balance - Comments: Steady sitting balance while EOB   Standing balance support: Bilateral upper extremity supported, During functional activity, Reliant on  assistive device for balance Standing balance-Leahy Scale: Fair Standing balance comment: Steady ambulation with RW                            Communication  Communication Communication: No apparent difficulties  Cognition Arousal: Alert Behavior During Therapy: WFL for tasks assessed/performed   PT - Cognitive impairments: No apparent impairments                         Following commands: Intact      Cueing Cueing Techniques: Verbal cues  Exercises      General Comments  Pre session: HR: 87 bpm, SpO2: 99% on room air,  Post session: HR: 84 bpm, SpO2: 97% on room air.       Pertinent Vitals/Pain Pain Assessment Pain Assessment: No/denies pain     PT Goals (current goals can now be found in the care plan section) Acute Rehab PT Goals Patient Stated Goal: to get stronger PT Goal Formulation: With patient Time For Goal Achievement: 01/17/24 Potential to Achieve Goals: Good    Frequency    Min 2X/week      PT Plan      Co-evaluation              AM-PAC PT 6 Clicks Mobility   Outcome Measure  Help needed turning from your back to your side while in a flat bed without using bedrails?: None Help needed moving from lying on your back to sitting on the side of a flat bed without using bedrails?: None Help needed moving to and from a bed to a chair (including a wheelchair)?: A Little Help needed standing up from a chair using your arms (e.g., wheelchair or bedside chair)?: A Little Help needed to walk in hospital room?: A Little Help needed climbing 3-5 steps with a railing? : A Lot 6 Click Score: 19    End of Session Equipment Utilized During Treatment: Gait belt Activity Tolerance: Patient tolerated treatment well Patient left: in chair;with call bell/phone within reach;with chair alarm set Nurse Communication: Mobility status PT Visit Diagnosis: Other abnormalities of gait and mobility (R26.89);Muscle weakness (generalized) (M62.81);Difficulty in walking, not elsewhere classified (R26.2)     Time: 8851-8795 PT Time Calculation (min) (ACUTE ONLY): 16 min  Charges:                             Delina Kruczek, SPT 01/08/24, 12:31 PM

## 2024-01-08 NOTE — Progress Notes (Signed)
 The patient had upper endoscopy yesterday with dilation of her esophagus.  She had significant stenosis that was dilated.  The patient should follow-up with Dr. Onita as an outpatient for further dilation. The dilation is limited by the size of the stricture and can only be dilated so far without risking perforation.  I will sign off.  Please call if any further GI concerns or questions.  We would like to thank you for the opportunity to participate in the care of Jenna Carlson.

## 2024-01-08 NOTE — Progress Notes (Signed)
  PROGRESS NOTE    Jenna Carlson  FMW:[id number] DOB: 1951/05/24 DOA: 01/02/2024 PCP: Lenon Layman ORN, MD  111A/111A-AA  LOS: 4 days   Brief hospital course:   Assessment & Plan: Jenna Carlson is a 73 y.o. female with medical history significant for Hypertension, asthma, breast cancer s/p chemo, history of PUD and esophageal stenosis with last dilatation 07/2022 being admitted with nausea and vomiting and difficulty swallowing.    She was admitted for the same a few weeks prior.  She states that pills get stuck in her throat.  She denies abdominal pain.  Has baseline chronic diarrhea for which she sees a gastroenterologist and has an upcoming colonoscopy.    * nausea and vomiting Dysphagia with history of esophageal stenosis s/p dilatation History of peptic ulcer disease S/p esophageal dilation on 7/21 --resume plavix  tomorrow --cont soft diet --cont PPI --outpatient f/u with GI Dr. Onita   AKI, ruled out CKD 3a --does not meet criteria   Hypokalemia --supplement PRN  Personal history of chemotherapy History of breast cancer No acute issues suspected  Asthma, chronic Not acutely exacerbated Albuterol  as needed  Essential hypertension --cont coreg  and Lisinopril   Chronic diarrhea Followed by GI.  Has upcoming colonoscopy --C diff and GI path neg --Imodium  PRN --outpatient f/u with Dr. Onita   DVT prophylaxis: Lovenox  SQ Code Status: DNR  Family Communication:  Level of care: Med-Surg Dispo:   The patient is from: home Anticipated d/c is to: SNF rehab Anticipated d/c date is: whenever SNF can accept   Subjective and Interval History:  Pt reported improved easier swallow.   Objective: Vitals:   01/08/24 0405 01/08/24 0747 01/08/24 1604 01/08/24 2014  BP: (!) 142/80 (!) 140/87 129/74 119/76  Pulse: 68 71 70 75  Resp: 18 18 18 18   Temp: 97.6 F (36.4 C) 98.3 F (36.8 C) 98.2 F (36.8 C) 98.1 F (36.7 C)  TempSrc:      SpO2: 99% 98% 97% 99%   Weight:      Height:        Intake/Output Summary (Last 24 hours) at 01/08/2024 2141 Last data filed at 01/08/2024 0900 Gross per 24 hour  Intake 240 ml  Output --  Net 240 ml    Filed Weights   01/02/24 1918  Weight: 70.3 kg    Examination:   Constitutional: NAD, AAOx3 HEENT: conjunctivae and lids normal, EOMI CV: No cyanosis.   RESP: normal respiratory effort, on RA Neuro: II - XII grossly intact.   Psych: Normal mood and affect.  Appropriate judgement and reason   Data Reviewed: I have personally reviewed labs and imaging studies  Time spent: 35 minutes  Ellouise Haber, MD [facility] If 7PM-7AM, please contact night-coverage 01/08/2024, 9:41 PM

## 2024-01-09 DIAGNOSIS — R112 Nausea with vomiting, unspecified: Secondary | ICD-10-CM | POA: Diagnosis not present

## 2024-01-09 LAB — CBC
HCT: 28.7 % — ABNORMAL LOW (ref 36.0–46.0)
Hemoglobin: 9.3 g/dL — ABNORMAL LOW (ref 12.0–15.0)
MCH: 33.5 pg (ref 26.0–34.0)
MCHC: 32.4 g/dL (ref 30.0–36.0)
MCV: 103.2 fL — ABNORMAL HIGH (ref 80.0–100.0)
Platelets: 208 K/uL (ref 150–400)
RBC: 2.78 MIL/uL — ABNORMAL LOW (ref 3.87–5.11)
RDW: 13.9 % (ref 11.5–15.5)
WBC: 7.8 K/uL (ref 4.0–10.5)
nRBC: 0 % (ref 0.0–0.2)

## 2024-01-09 MED ORDER — MONTELUKAST SODIUM 10 MG PO TABS
10.0000 mg | ORAL_TABLET | Freq: Every day | ORAL | Status: DC
  Administered 2024-01-09 – 2024-01-10 (×2): 10 mg via ORAL
  Filled 2024-01-09 (×2): qty 1

## 2024-01-09 MED ORDER — FLUTICASONE FUROATE-VILANTEROL 100-25 MCG/ACT IN AEPB
1.0000 | INHALATION_SPRAY | Freq: Every day | RESPIRATORY_TRACT | Status: DC
  Administered 2024-01-09 – 2024-01-11 (×3): 1 via RESPIRATORY_TRACT
  Filled 2024-01-09: qty 28

## 2024-01-09 MED ORDER — PROSIGHT PO TABS
1.0000 | ORAL_TABLET | Freq: Every day | ORAL | Status: DC
  Administered 2024-01-09 – 2024-01-10 (×2): 1 via ORAL
  Filled 2024-01-09 (×2): qty 1

## 2024-01-09 MED ORDER — OCUVITE-LUTEIN PO CAPS
1.0000 | ORAL_CAPSULE | Freq: Two times a day (BID) | ORAL | Status: DC
  Filled 2024-01-09: qty 1

## 2024-01-09 NOTE — TOC Progression Note (Signed)
 Transition of Care [facility]) - Progression Note    Patient Details  Name: Jenna Carlson MRN: [id number] Date of Birth: Apr 10, 1951  [facility] CM/SW Contact  Dalia GORMAN Fuse, RN Phone Number: 01/09/2024, 3:39 PM  Clinical Narrative:     TOC met with the patient in her room. The plan is for the patient to discharge to SNF when medically ready. Her choice for SNF is [address] and [facility], and [facility] in that order. [facility] has offered a bed. TOC will reach out to [place] to request insurance auth.                     Expected Discharge Plan and Services                                               Social Drivers of Health (SDOH) Interventions SDOH Screenings   Food Insecurity: No Food Insecurity (01/03/2024)  Housing: Low Risk  (01/03/2024)  Transportation Needs: No Transportation Needs (01/03/2024)  Utilities: Not At Risk (01/03/2024)  Financial Resource Strain: Low Risk  (12/18/2023)   Received from [facility]  Social Connections: Moderately Isolated (01/03/2024)  Tobacco Use: Low Risk  (01/07/2024)  Recent Concern: Tobacco Use - Medium Risk (12/18/2023)   Received from [facility]    Readmission Risk Interventions     No data to display

## 2024-01-09 NOTE — Hospital Course (Addendum)
 Hospital course / significant events:   HPI: Jenna Carlson is a 73 y.o. female with medical history significant for Hypertension, asthma, breast cancer s/p chemo, history of PUD and esophageal stenosis with last dilatation 07/2022 being admitted with nausea and vomiting and difficulty swallowing.  She was admitted for the same a few weeks prior. She states that pills get stuck in her throat. She denies abdominal pain. Has baseline chronic diarrhea for which she sees a gastroenterologist and has an upcoming colonoscopy.   07/17: admitted to hospitalist. Plan EGD pending Plavix  washout.  07/21: EGD w/ esophageal dilation 07/22-07/24: pending STR placement      Consultants:  Gastroenterology   Procedures/Surgeries: 07/21: EGD w/ esophageal dilation       ASSESSMENT & PLAN:   Nausea and vomiting Dysphagia with history of esophageal stenosis s/p dilatation History of peptic ulcer disease S/p esophageal dilation on 7/21 cont soft diet cont PPI outpatient f/u with GI Dr. Onita  Restarted plavix     AKI, ruled out CKD 3a Monitor BMP periodically   Hypokalemia Replace as needed Monitor BMP   History of chemotherapy History of breast cancer No acute issues suspected   Asthma, chronic Not acutely exacerbated Albuterol  as needed   Essential hypertension cont coreg  and Lisinopril    Chronic diarrhea Followed by GI.   C diff and GI path neg Imodium  PRN outpatient f/u with Dr. Onita    overweight based on BMI: Body mass index is 25.79 kg/m.SABRA Significantly low or high BMI is associated with higher medical risk.  Underweight - under 18  overweight - 25 to 29 obese - 30 or more Class 1 obesity: BMI of 30.0 to 34 Class 2 obesity: BMI of 35.0 to 39 Class 3 obesity: BMI of 40.0 to 49 Super Morbid Obesity: BMI 50-59 Super-super Morbid Obesity: BMI 60+ Healthy nutrition and physical activity advised as adjunct to other disease management and risk reduction  treatments    DVT prophylaxis: lovenox  IV fluids: no continuous IV fluids  Nutrition: soft diet Central lines / other devices: noen  Code Status: DNR ACP documentation reviewed:  none on file in [facility]  [facility] needs: SNF STR placement Medical barriers to dispo: none.

## 2024-01-09 NOTE — Plan of Care (Signed)

## 2024-01-09 NOTE — Progress Notes (Signed)
 PROGRESS NOTE    Jenna Carlson   FMW:[id number] DOB: [birth year]  DOA: 01/02/2024 Date of Service: 01/09/24 which is hospital day 5  PCP: Lenon Layman ORN, MD    Hospital course / significant events:   HPI: Jenna Carlson is a 73 y.o. female with medical history significant for Hypertension, asthma, breast cancer s/p chemo, history of PUD and esophageal stenosis with last dilatation 07/2022 being admitted with nausea and vomiting and difficulty swallowing.  She was admitted for the same a few weeks prior. She states that pills get stuck in her throat. She denies abdominal pain. Has baseline chronic diarrhea for which she sees a gastroenterologist and has an upcoming colonoscopy.   07/17: admitted to hospitalist. Plan EGD pending Plavix  washout.  07/21: EGD w/ esophageal dilation 07/22-07/23: pending STR placement      Consultants:  Gastroenterology   Procedures/Surgeries: 07/21: EGD w/ esophageal dilation       ASSESSMENT & PLAN:   Nausea and vomiting Dysphagia with history of esophageal stenosis s/p dilatation History of peptic ulcer disease S/p esophageal dilation on 7/21 resume plavix   cont soft diet cont PPI outpatient f/u with GI Dr. Onita    AKI, ruled out CKD 3a Monitor BMP periodically   Hypokalemia Replace as needed Monitor BMP   History of chemotherapy History of breast cancer No acute issues suspected   Asthma, chronic Not acutely exacerbated Albuterol  as needed   Essential hypertension cont coreg  and Lisinopril    Chronic diarrhea Followed by GI.  Has upcoming colonoscopy C diff and GI path neg Imodium  PRN outpatient f/u with Dr. Onita    overweight based on BMI: Body mass index is 25.79 kg/m.SABRA Significantly low or high BMI is associated with higher medical risk.  Underweight - under 18  overweight - 25 to 29 obese - 30 or more Class 1 obesity: BMI of 30.0 to 34 Class 2 obesity: BMI of 35.0 to 39 Class 3 obesity: BMI of  40.0 to 49 Super Morbid Obesity: BMI 50-59 Super-super Morbid Obesity: BMI 60+ Healthy nutrition and physical activity advised as adjunct to other disease management and risk reduction treatments    DVT prophylaxis: lovenox  IV fluids: no continuous IV fluids  Nutrition: soft diet Central lines / other devices: noen  Code Status: DNR ACP documentation reviewed:  none on file in [facility]  [facility] needs: SNF STR placement Medical barriers to dispo: none.              Subjective / Brief ROS:  Patient reports feeling better today Denies CP/SOB.  Pain controlled.  Denies new weakness.  Tolerating diet.  Reports no concerns w/ urination/defecation.   Family Communication: none at this teim    Objective Findings:  Vitals:   01/08/24 1604 01/08/24 2014 01/09/24 0454 01/09/24 0756  BP: 129/74 119/76 123/79 133/87  Pulse: 70 75 64 68  Resp: 18 18 18 16   Temp: 98.2 F (36.8 C) 98.1 F (36.7 C) 97.8 F (36.6 C) 98.4 F (36.9 C)  TempSrc:      SpO2: 97% 99% 100% 98%  Weight:      Height:        Intake/Output Summary (Last 24 hours) at 01/09/2024 1526 Last data filed at 01/09/2024 1031 Gross per 24 hour  Intake 200 ml  Output --  Net 200 ml   Filed Weights   01/02/24 1918  Weight: 70.3 kg    Examination:  Physical Exam Constitutional:      General: She is not  in acute distress. Cardiovascular:     Rate and Rhythm: Normal rate and regular rhythm.  Pulmonary:     Effort: Pulmonary effort is normal.     Breath sounds: Normal breath sounds.  Abdominal:     General: Abdomen is flat.     Palpations: Abdomen is soft.  Neurological:     Mental Status: She is alert.  Psychiatric:        Mood and Affect: Mood normal.        Behavior: Behavior normal.          Scheduled Medications:   ARIPiprazole   5 mg Oral Daily   carvedilol   6.25 mg Oral BID WC   clopidogrel   75 mg Oral Daily   enoxaparin  (LOVENOX ) injection  40 mg Subcutaneous Q24H   feeding  supplement  237 mL Oral TID BM   fluticasone  furoate-vilanterol  1 puff Inhalation Daily   lisinopril   20 mg Oral Daily   montelukast   10 mg Oral QHS   pantoprazole   40 mg Oral Daily   potassium chloride   40 mEq Oral Daily   PreserVision AREDS 2  1 capsule Oral BID   QUEtiapine   25 mg Oral QHS   rosuvastatin   20 mg Oral Daily    Continuous Infusions:   PRN Medications:  acetaminophen  **OR** acetaminophen , albuterol , docusate sodium , HYDROcodone -acetaminophen , loperamide , ondansetron  **OR** ondansetron  (ZOFRAN ) IV  Antimicrobials from admission:  Anti-infectives (From admission, onward)    None           Data Reviewed:  I have personally reviewed the following...  CBC: Recent Labs  Lab 01/03/24 0419 01/04/24 0347 01/05/24 0439 01/07/24 0812 01/09/24 0428  WBC 11.2* 9.0 8.5 9.0 7.8  HGB 10.3* 9.2* 8.9* 10.5* 9.3*  HCT 32.0* 27.5* 27.2* 34.0* 28.7*  MCV 103.9* 101.1* 102.3* 105.6* 103.2*  PLT 275 219 207 223 208   Basic Metabolic Panel: Recent Labs  Lab 01/02/24 1923 01/02/24 1930 01/03/24 0419 01/04/24 0347 01/05/24 0439 01/07/24 0531  NA  --  138 139 139 139 137  K  --  3.2* 4.2 3.3* 3.7 3.9  CL  --  106 110 114* 113* 105  CO2  --  21* 22 21* 21* 22  GLUCOSE  --  112* 88 76 81 80  BUN  --  13 13 11 9  <5*  CREATININE  --  1.35* 1.31* 1.10* 1.19* 1.01*  CALCIUM   --  8.8* 8.0* 7.5* 7.6* 8.2*  MG 1.9  --   --  1.7 1.7 1.7  PHOS 3.6  --   --   --   --   --    GFR: Estimated Creatinine Clearance: 47.3 mL/min (A) (by C-G formula based on SCr of 1.01 mg/dL (H)). Liver Function Tests: Recent Labs  Lab 01/02/24 1930  AST 34  ALT 15  ALKPHOS 50  BILITOT 0.7  PROT 6.3*  ALBUMIN 3.1*   No results for input(s): LIPASE, AMYLASE in the last 168 hours. No results for input(s): AMMONIA in the last 168 hours. Coagulation Profile: No results for input(s): INR, PROTIME in the last 168 hours. Cardiac Enzymes: Recent Labs  Lab 01/02/24 1923   CKTOTAL 48   BNP (last 3 results) No results for input(s): PROBNP in the last 8760 hours. HbA1C: No results for input(s): HGBA1C in the last 72 hours. CBG: No results for input(s): GLUCAP in the last 168 hours. Lipid Profile: No results for input(s): CHOL, HDL, LDLCALC, TRIG, CHOLHDL, LDLDIRECT in the last 72 hours.  Thyroid  Function Tests: No results for input(s): TSH, T4TOTAL, FREET4, T3FREE, THYROIDAB in the last 72 hours. Anemia Panel: No results for input(s): VITAMINB12, FOLATE, FERRITIN, TIBC, IRON, RETICCTPCT in the last 72 hours. Most Recent Urinalysis On File:     Component Value Date/Time   COLORURINE YELLOW (A) 01/03/2024 0329   APPEARANCEUR HAZY (A) 01/03/2024 0329   LABSPEC 1.010 01/03/2024 0329   PHURINE 6.0 01/03/2024 0329   GLUCOSEU NEGATIVE 01/03/2024 0329   HGBUR NEGATIVE 01/03/2024 0329   BILIRUBINUR NEGATIVE 01/03/2024 0329   KETONESUR NEGATIVE 01/03/2024 0329   PROTEINUR NEGATIVE 01/03/2024 0329   NITRITE NEGATIVE 01/03/2024 0329   LEUKOCYTESUR TRACE (A) 01/03/2024 0329   Sepsis Labs: @LABRCNTIP (procalcitonin:4,lacticidven:4) Microbiology: Recent Results (from the past 240 hours)  C Difficile Quick Screen w PCR reflex     Status: None   Collection Time: 01/05/24 10:15 AM   Specimen: STOOL  Result Value Ref Range Status   C Diff antigen NEGATIVE NEGATIVE Final   C Diff toxin NEGATIVE NEGATIVE Final   C Diff interpretation No C. difficile detected.  Final    Comment: Performed at [facility], [address] [place], [place] [address]  Gastrointestinal Panel by PCR , Stool     Status: None   Collection Time: 01/05/24 10:15 AM   Specimen: Stool  Result Value Ref Range Status   Campylobacter species NOT DETECTED NOT DETECTED Final   Plesimonas shigelloides NOT DETECTED NOT DETECTED Final   Salmonella species NOT DETECTED NOT DETECTED Final   Yersinia enterocolitica NOT DETECTED NOT DETECTED Final    Vibrio species NOT DETECTED NOT DETECTED Final   Vibrio cholerae NOT DETECTED NOT DETECTED Final   Enteroaggregative E coli (EAEC) NOT DETECTED NOT DETECTED Final   Enteropathogenic E coli (EPEC) NOT DETECTED NOT DETECTED Final   Enterotoxigenic E coli (ETEC) NOT DETECTED NOT DETECTED Final   Shiga like toxin producing E coli (STEC) NOT DETECTED NOT DETECTED Final   Shigella/Enteroinvasive E coli (EIEC) NOT DETECTED NOT DETECTED Final   Cryptosporidium NOT DETECTED NOT DETECTED Final   Cyclospora cayetanensis NOT DETECTED NOT DETECTED Final   Entamoeba histolytica NOT DETECTED NOT DETECTED Final   Giardia lamblia NOT DETECTED NOT DETECTED Final   Adenovirus F40/41 NOT DETECTED NOT DETECTED Final   Astrovirus NOT DETECTED NOT DETECTED Final   Norovirus GI/GII NOT DETECTED NOT DETECTED Final   Rotavirus A NOT DETECTED NOT DETECTED Final   Sapovirus (I, II, IV, and V) NOT DETECTED NOT DETECTED Final    Comment: Performed at [facility], [address], [place], [place] [address]      Radiology Studies last 3 days: No results found.        Geraldin Habermehl, DO [facility] 01/09/2024, 3:26 PM    Dictation software may have been used to generate the above note. Typos may occur and escape review in typed/dictated notes. Please contact Dr Marsa directly for clarity if needed.  Staff may message me via secure chat in Epic  but this may not receive an immediate response,  please page me for urgent matters!  If 7PM-7AM, please contact night coverage [email or web address]

## 2024-01-09 NOTE — Progress Notes (Signed)
 Physical Therapy Treatment Patient Details Name: Jenna Carlson MRN: [id number] DOB: 22-Aug-1950 Today's Date: 01/09/2024   History of Present Illness Jenna Carlson is a 73 y.o. female with medical history significant for Hypertension, asthma, breast cancer s/p chemo, history of PUD and esophageal stenosis with last dilatation 07/2022 being admitted with intractable nausea and vomiting.  She was admitted for the same a few weeks prior.  She states that pills get stuck in her throat.  She denies abdominal pain.  Has baseline chronic diarrhea for which she sees a gastroenterologist and has an upcoming colonoscopy.    PT Comments  Pt supine in bed with HOB elevated upon entry, and agreeable to therapy today. PT focused today's session on increasing activity tolerance and working on stair negotiation. During session pt was able to perform bed mobility (supine to sit) with mod independence, performed a STS from EOB to RW with CGA (from an elevated bed height), was able to ambulate 154ft with RW and CGA (for increased safety) and ascended/descended 4 steps before requiring a seated rest break. During stair navigation, pt reported feeling a bit woozy and dizzy, but was able to descend following a brief standing break. Despite improvement in activity tolerance and the ability to navigate stairs with minimal assistance, pt's report of wooziness and dizziness impair her safety if she were to go home alone. Following session, pt does not increased fatigue which is indicative of a shorter activity tolerance compared to PLOF. Pt would benefit from skilled PT to continue to work towards PT goals and return to PLOF.     If plan is discharge home, recommend the following: A little help with walking and/or transfers;A little help with bathing/dressing/bathroom;Assist for transportation;Help with stairs or ramp for entrance   Can travel by private vehicle     Yes  Equipment Recommendations  None recommended by PT     Recommendations for Other Services       Precautions / Restrictions Precautions Precautions: Fall Recall of Precautions/Restrictions: Intact Restrictions Weight Bearing Restrictions Per Provider Order: No     Mobility  Bed Mobility Overal bed mobility: Modified Independent Bed Mobility: Supine to Sit     Supine to sit: Modified independent (Device/Increase time)     General bed mobility comments: Increased time secondary to fatigue    Transfers Overall transfer level: Needs assistance Equipment used: Rolling walker (2 wheels) Transfers: Sit to/from Stand Sit to Stand: Contact guard assist, From elevated surface           General transfer comment: 1x STS from bed elevated ~7 inches.    Ambulation/Gait Ambulation/Gait assistance: Contact guard assist (for safety) Gait Distance (Feet): 120 Feet Assistive device: Rolling walker (2 wheels) Gait Pattern/deviations: Step-through pattern, Decreased step length - right, Decreased step length - left, Decreased stride length, Narrow base of support       General Gait Details: Steady with BUE supported on RW   Stairs Stairs: Yes Stairs assistance: Contact guard assist Stair Management: Two rails, Step to pattern, Forwards Number of Stairs: 4 General stair comments: Pt able to safely ascend/descend stairs with BUE support       Balance Overall balance assessment: Needs assistance Sitting-balance support: Feet supported Sitting balance-Leahy Scale: Good Sitting balance - Comments: Steady sitting balance while EOB   Standing balance support: Bilateral upper extremity supported, During functional activity, Reliant on assistive device for balance Standing balance-Leahy Scale: Fair Standing balance comment: Steady ambulation with RW  Communication Communication Communication: No apparent difficulties  Cognition Arousal: Alert Behavior During Therapy: WFL for tasks assessed/performed   PT -  Cognitive impairments: No apparent impairments       Following commands: Intact      Cueing Cueing Techniques: Verbal cues     General Comments General comments (skin integrity, edema, etc.): Anxiety with ambulation by herself      Pertinent Vitals/Pain Pain Assessment Pain Assessment: No/denies pain     PT Goals (current goals can now be found in the care plan section) Acute Rehab PT Goals Patient Stated Goal: to get stronger PT Goal Formulation: With patient Time For Goal Achievement: 01/17/24 Potential to Achieve Goals: Good Progress towards PT goals: Progressing toward goals    Frequency    Min 2X/week       AM-PAC PT 6 Clicks Mobility   Outcome Measure  Help needed turning from your back to your side while in a flat bed without using bedrails?: None Help needed moving from lying on your back to sitting on the side of a flat bed without using bedrails?: None Help needed moving to and from a bed to a chair (including a wheelchair)?: A Little Help needed standing up from a chair using your arms (e.g., wheelchair or bedside chair)?: A Little Help needed to walk in hospital room?: A Little Help needed climbing 3-5 steps with a railing? : A Lot 6 Click Score: 19    End of Session Equipment Utilized During Treatment: Gait belt Activity Tolerance: Patient tolerated treatment well Patient left: in chair;with call bell/phone within reach;with chair alarm set Nurse Communication: Mobility status PT Visit Diagnosis: Other abnormalities of gait and mobility (R26.89);Muscle weakness (generalized) (M62.81);Difficulty in walking, not elsewhere classified (R26.2)     Time: 8467-8442 PT Time Calculation (min) (ACUTE ONLY): 25 min  Charges:                           Tatym Schermer, SPT 01/09/24, 4:18 PM

## 2024-01-10 DIAGNOSIS — R112 Nausea with vomiting, unspecified: Secondary | ICD-10-CM | POA: Diagnosis not present

## 2024-01-10 LAB — CBC
HCT: 27.8 % — ABNORMAL LOW (ref 36.0–46.0)
Hemoglobin: 9.1 g/dL — ABNORMAL LOW (ref 12.0–15.0)
MCH: 33 pg (ref 26.0–34.0)
MCHC: 32.7 g/dL (ref 30.0–36.0)
MCV: 100.7 fL — ABNORMAL HIGH (ref 80.0–100.0)
Platelets: 212 K/uL (ref 150–400)
RBC: 2.76 MIL/uL — ABNORMAL LOW (ref 3.87–5.11)
RDW: 14 % (ref 11.5–15.5)
WBC: 8 K/uL (ref 4.0–10.5)
nRBC: 0 % (ref 0.0–0.2)

## 2024-01-10 LAB — BASIC METABOLIC PANEL WITH GFR
Anion gap: 7 (ref 5–15)
BUN: 6 mg/dL — ABNORMAL LOW (ref 8–23)
CO2: 25 mmol/L (ref 22–32)
Calcium: 8.8 mg/dL — ABNORMAL LOW (ref 8.9–10.3)
Chloride: 107 mmol/L (ref 98–111)
Creatinine, Ser: 0.92 mg/dL (ref 0.44–1.00)
GFR, Estimated: >60 mL/min (ref >60)
Glucose, Bld: 88 mg/dL (ref 70–99)
Potassium: 3.4 mmol/L — ABNORMAL LOW (ref 3.5–5.1)
Sodium: 139 mmol/L (ref 135–145)

## 2024-01-10 MED ORDER — BOOST / RESOURCE BREEZE PO LIQD CUSTOM
1.0000 | Freq: Three times a day (TID) | ORAL | Status: DC
  Administered 2024-01-10 – 2024-01-11 (×2): 1 via ORAL

## 2024-01-10 NOTE — Plan of Care (Signed)
 Patient is tolerating diet and no nausea, great improvement. Patient is stating she is ready for rehab!    Problem: Clinical Measurements: Goal: Will remain free from infection Outcome: Adequate for Discharge Goal: Diagnostic test results will improve Outcome: Adequate for Discharge Goal: Respiratory complications will improve Outcome: Adequate for Discharge Goal: Cardiovascular complication will be avoided Outcome: Adequate for Discharge   Problem: Activity: Goal: Risk for activity intolerance will decrease Outcome: Adequate for Discharge   Problem: Nutrition: Goal: Adequate nutrition will be maintained Outcome: Adequate for Discharge   Problem: Coping: Goal: Level of anxiety will decrease Outcome: Adequate for Discharge   Problem: Elimination: Goal: Will not experience complications related to bowel motility Outcome: Adequate for Discharge Goal: Will not experience complications related to urinary retention Outcome: Adequate for Discharge   Problem: Pain Managment: Goal: General experience of comfort will improve and/or be controlled Outcome: Adequate for Discharge   Problem: Safety: Goal: Ability to remain free from injury will improve Outcome: Adequate for Discharge   Problem: Skin Integrity: Goal: Risk for impaired skin integrity will decrease Outcome: Adequate for Discharge

## 2024-01-10 NOTE — Progress Notes (Signed)
 PROGRESS NOTE    Jenna Carlson   FMW:[id number] DOB: 1951-01-01  DOA: 01/02/2024 Date of Service: 01/10/24 which is hospital day 6  PCP: Lenon Layman ORN, MD    Hospital course / significant events:   HPI: Jenna Carlson is a 73 y.o. female with medical history significant for Hypertension, asthma, breast cancer s/p chemo, history of PUD and esophageal stenosis with last dilatation 07/2022 being admitted with nausea and vomiting and difficulty swallowing.  She was admitted for the same a few weeks prior. She states that pills get stuck in her throat. She denies abdominal pain. Has baseline chronic diarrhea for which she sees a gastroenterologist and has an upcoming colonoscopy.   07/17: admitted to hospitalist. Plan EGD pending Plavix  washout.  07/21: EGD w/ esophageal dilation 07/22-07/24: pending STR placement      Consultants:  Gastroenterology   Procedures/Surgeries: 07/21: EGD w/ esophageal dilation       ASSESSMENT & PLAN:   Nausea and vomiting Dysphagia with history of esophageal stenosis s/p dilatation History of peptic ulcer disease S/p esophageal dilation on 7/21 cont soft diet cont PPI outpatient f/u with GI Dr. Onita  Restarted plavix     AKI, ruled out CKD 3a Monitor BMP periodically   Hypokalemia Replace as needed Monitor BMP   History of chemotherapy History of breast cancer No acute issues suspected   Asthma, chronic Not acutely exacerbated Albuterol  as needed   Essential hypertension cont coreg  and Lisinopril    Chronic diarrhea Followed by GI.   C diff and GI path neg Imodium  PRN outpatient f/u with Dr. Onita    overweight based on BMI: Body mass index is 25.79 kg/m.SABRA Significantly low or high BMI is associated with higher medical risk.  Underweight - under 18  overweight - 25 to 29 obese - 30 or more Class 1 obesity: BMI of 30.0 to 34 Class 2 obesity: BMI of 35.0 to 39 Class 3 obesity: BMI of 40.0 to 49 Super Morbid  Obesity: BMI 50-59 Super-super Morbid Obesity: BMI 60+ Healthy nutrition and physical activity advised as adjunct to other disease management and risk reduction treatments    DVT prophylaxis: lovenox  IV fluids: no continuous IV fluids  Nutrition: soft diet Central lines / other devices: noen  Code Status: DNR ACP documentation reviewed:  none on file in [facility]  [facility] needs: SNF STR placement Medical barriers to dispo: none.              Subjective / Brief ROS:  Patient reports no concerns today Denies CP/SOB.  Pain controlled.  Denies new weakness.  Tolerating diet.   Family Communication: none at this time    Objective Findings:  Vitals:   01/09/24 1639 01/09/24 1954 01/10/24 0542 01/10/24 0758  BP: 93/82 122/70 126/76 127/86  Pulse: 74 74 72 71  Resp: 16 16 12 15   Temp:  97.9 F (36.6 C)  97.8 F (36.6 C)  TempSrc:      SpO2: 100% 98% 98% 96%  Weight:      Height:        Intake/Output Summary (Last 24 hours) at 01/10/2024 1532 Last data filed at 01/10/2024 1446 Gross per 24 hour  Intake 360 ml  Output --  Net 360 ml   Filed Weights   01/02/24 1918  Weight: 70.3 kg    Examination:  Physical Exam Constitutional:      General: She is not in acute distress. Cardiovascular:     Rate and Rhythm: Normal rate and  regular rhythm.  Pulmonary:     Effort: Pulmonary effort is normal.     Breath sounds: Normal breath sounds.  Abdominal:     General: Abdomen is flat.     Palpations: Abdomen is soft.  Neurological:     Mental Status: She is alert.  Psychiatric:        Mood and Affect: Mood normal.        Behavior: Behavior normal.          Scheduled Medications:   ARIPiprazole   5 mg Oral Daily   carvedilol   6.25 mg Oral BID WC   clopidogrel   75 mg Oral Daily   enoxaparin  (LOVENOX ) injection  40 mg Subcutaneous Q24H   feeding supplement  1 Container Oral TID BM   fluticasone  furoate-vilanterol  1 puff Inhalation Daily   lisinopril   20 mg  Oral Daily   montelukast   10 mg Oral QHS   multivitamin  1 tablet Oral Daily   pantoprazole   40 mg Oral Daily   potassium chloride   40 mEq Oral Daily   QUEtiapine   25 mg Oral QHS   rosuvastatin   20 mg Oral Daily    Continuous Infusions:   PRN Medications:  acetaminophen  **OR** acetaminophen , albuterol , docusate sodium , HYDROcodone -acetaminophen , loperamide , ondansetron  **OR** ondansetron  (ZOFRAN ) IV  Antimicrobials from admission:  Anti-infectives (From admission, onward)    None           Data Reviewed:  I have personally reviewed the following...  CBC: Recent Labs  Lab 01/04/24 0347 01/05/24 0439 01/07/24 0812 01/09/24 0428 01/10/24 0436  WBC 9.0 8.5 9.0 7.8 8.0  HGB 9.2* 8.9* 10.5* 9.3* 9.1*  HCT 27.5* 27.2* 34.0* 28.7* 27.8*  MCV 101.1* 102.3* 105.6* 103.2* 100.7*  PLT 219 207 223 208 212   Basic Metabolic Panel: Recent Labs  Lab 01/04/24 0347 01/05/24 0439 01/07/24 0531 01/10/24 0436  NA 139 139 137 139  K 3.3* 3.7 3.9 3.4*  CL 114* 113* 105 107  CO2 21* 21* 22 25  GLUCOSE 76 81 80 88  BUN 11 9 <5* 6*  CREATININE 1.10* 1.19* 1.01* 0.92  CALCIUM  7.5* 7.6* 8.2* 8.8*  MG 1.7 1.7 1.7  --    GFR: Estimated Creatinine Clearance: 52 mL/min (by C-G formula based on SCr of 0.92 mg/dL). Liver Function Tests: No results for input(s): AST, ALT, ALKPHOS, BILITOT, PROT, ALBUMIN in the last 168 hours.  No results for input(s): LIPASE, AMYLASE in the last 168 hours. No results for input(s): AMMONIA in the last 168 hours. Coagulation Profile: No results for input(s): INR, PROTIME in the last 168 hours. Cardiac Enzymes: No results for input(s): CKTOTAL, CKMB, CKMBINDEX, TROPONINI in the last 168 hours.  BNP (last 3 results) No results for input(s): PROBNP in the last 8760 hours. HbA1C: No results for input(s): HGBA1C in the last 72 hours. CBG: No results for input(s): GLUCAP in the last 168 hours. Lipid Profile: No  results for input(s): CHOL, HDL, LDLCALC, TRIG, CHOLHDL, LDLDIRECT in the last 72 hours. Thyroid  Function Tests: No results for input(s): TSH, T4TOTAL, FREET4, T3FREE, THYROIDAB in the last 72 hours. Anemia Panel: No results for input(s): VITAMINB12, FOLATE, FERRITIN, TIBC, IRON, RETICCTPCT in the last 72 hours. Most Recent Urinalysis On File:     Component Value Date/Time   COLORURINE YELLOW (A) 01/03/2024 0329   APPEARANCEUR HAZY (A) 01/03/2024 0329   LABSPEC 1.010 01/03/2024 0329   PHURINE 6.0 01/03/2024 0329   GLUCOSEU NEGATIVE 01/03/2024 0329   HGBUR NEGATIVE 01/03/2024  0329   BILIRUBINUR NEGATIVE 01/03/2024 0329   KETONESUR NEGATIVE 01/03/2024 0329   PROTEINUR NEGATIVE 01/03/2024 0329   NITRITE NEGATIVE 01/03/2024 0329   LEUKOCYTESUR TRACE (A) 01/03/2024 0329   Sepsis Labs: @LABRCNTIP (procalcitonin:4,lacticidven:4) Microbiology: Recent Results (from the past 240 hours)  C Difficile Quick Screen w PCR reflex     Status: None   Collection Time: 01/05/24 10:15 AM   Specimen: STOOL  Result Value Ref Range Status   C Diff antigen NEGATIVE NEGATIVE Final   C Diff toxin NEGATIVE NEGATIVE Final   C Diff interpretation No C. difficile detected.  Final    Comment: Performed at [facility], [address] [place], [place] [address]  Gastrointestinal Panel by PCR , Stool     Status: None   Collection Time: 01/05/24 10:15 AM   Specimen: Stool  Result Value Ref Range Status   Campylobacter species NOT DETECTED NOT DETECTED Final   Plesimonas shigelloides NOT DETECTED NOT DETECTED Final   Salmonella species NOT DETECTED NOT DETECTED Final   Yersinia enterocolitica NOT DETECTED NOT DETECTED Final   Vibrio species NOT DETECTED NOT DETECTED Final   Vibrio cholerae NOT DETECTED NOT DETECTED Final   Enteroaggregative E coli (EAEC) NOT DETECTED NOT DETECTED Final   Enteropathogenic E coli (EPEC) NOT DETECTED NOT DETECTED Final    Enterotoxigenic E coli (ETEC) NOT DETECTED NOT DETECTED Final   Shiga like toxin producing E coli (STEC) NOT DETECTED NOT DETECTED Final   Shigella/Enteroinvasive E coli (EIEC) NOT DETECTED NOT DETECTED Final   Cryptosporidium NOT DETECTED NOT DETECTED Final   Cyclospora cayetanensis NOT DETECTED NOT DETECTED Final   Entamoeba histolytica NOT DETECTED NOT DETECTED Final   Giardia lamblia NOT DETECTED NOT DETECTED Final   Adenovirus F40/41 NOT DETECTED NOT DETECTED Final   Astrovirus NOT DETECTED NOT DETECTED Final   Norovirus GI/GII NOT DETECTED NOT DETECTED Final   Rotavirus A NOT DETECTED NOT DETECTED Final   Sapovirus (I, II, IV, and V) NOT DETECTED NOT DETECTED Final    Comment: Performed at [facility], [address], [place], [place] [address]      Radiology Studies last 3 days: No results found.        Elior Robinette, DO [facility] 01/10/2024, 3:32 PM    Dictation software may have been used to generate the above note. Typos may occur and escape review in typed/dictated notes. Please contact Dr Marsa directly for clarity if needed.  Staff may message me via secure chat in Epic  but this may not receive an immediate response,  please page me for urgent matters!  If 7PM-7AM, please contact night coverage [email or web address]

## 2024-01-11 ENCOUNTER — Emergency Department
Admission: EM | Admit: 2024-01-11 | Discharge: 2024-01-18 | Disposition: A | Discharge status: Home / Self Care | Attending: Emergency Medicine | Admitting: Emergency Medicine

## 2024-01-11 ENCOUNTER — Encounter: Payer: Self-pay | Admitting: Emergency Medicine

## 2024-01-11 ENCOUNTER — Other Ambulatory Visit: Payer: Self-pay

## 2024-01-11 DIAGNOSIS — R112 Nausea with vomiting, unspecified: Secondary | ICD-10-CM | POA: Diagnosis not present

## 2024-01-11 DIAGNOSIS — R1310 Dysphagia, unspecified: Secondary | ICD-10-CM | POA: Insufficient documentation

## 2024-01-11 DIAGNOSIS — I10 Essential (primary) hypertension: Secondary | ICD-10-CM | POA: Insufficient documentation

## 2024-01-11 DIAGNOSIS — R262 Difficulty in walking, not elsewhere classified: Secondary | ICD-10-CM | POA: Insufficient documentation

## 2024-01-11 DIAGNOSIS — R531 Weakness: Secondary | ICD-10-CM | POA: Insufficient documentation

## 2024-01-11 LAB — CBC
HCT: 35.3 % — ABNORMAL LOW (ref 36.0–46.0)
Hemoglobin: 11.1 g/dL — ABNORMAL LOW (ref 12.0–15.0)
MCH: 32.9 pg (ref 26.0–34.0)
MCHC: 31.4 g/dL (ref 30.0–36.0)
MCV: 104.7 fL — ABNORMAL HIGH (ref 80.0–100.0)
Platelets: 298 K/uL (ref 150–400)
RBC: 3.37 MIL/uL — ABNORMAL LOW (ref 3.87–5.11)
RDW: 14 % (ref 11.5–15.5)
WBC: 8.5 K/uL (ref 4.0–10.5)
nRBC: 0 % (ref 0.0–0.2)

## 2024-01-11 LAB — COMPREHENSIVE METABOLIC PANEL WITH GFR
ALT: 16 U/L (ref 0–44)
AST: 39 U/L (ref 15–41)
Albumin: 2.6 g/dL — ABNORMAL LOW (ref 3.5–5.0)
Alkaline Phosphatase: 44 U/L (ref 38–126)
Anion gap: 7 (ref 5–15)
BUN: 8 mg/dL (ref 8–23)
CO2: 22 mmol/L (ref 22–32)
Calcium: 9.8 mg/dL (ref 8.9–10.3)
Chloride: 106 mmol/L (ref 98–111)
Creatinine, Ser: 1.08 mg/dL — ABNORMAL HIGH (ref 0.44–1.00)
GFR, Estimated: 54 mL/min — ABNORMAL LOW (ref >60)
Glucose, Bld: 103 mg/dL — ABNORMAL HIGH (ref 70–99)
Potassium: 3.9 mmol/L (ref 3.5–5.1)
Sodium: 135 mmol/L (ref 135–145)
Total Bilirubin: 0.9 mg/dL (ref 0.0–1.2)
Total Protein: 6.1 g/dL — ABNORMAL LOW (ref 6.5–8.1)

## 2024-01-11 LAB — CBG MONITORING, ED: Glucose-Capillary: 91 mg/dL (ref 70–99)

## 2024-01-11 MED ORDER — SODIUM CHLORIDE 0.9 % IV BOLUS
1000.0000 mL | Freq: Once | INTRAVENOUS | Status: AC
  Administered 2024-01-11: 1000 mL via INTRAVENOUS

## 2024-01-11 NOTE — ED Provider Notes (Signed)
 [facility] Provider Note    Event Date/Time   First MD Initiated Contact with Patient 01/11/24 2048     (approximate)   History   Weakness   HPI  Jenna Carlson is a 73 year old female with history of hypertension recently admitted for nausea and vomiting presenting back to the emergency department for evaluation of weakness.  Patient was discharged from the hospital earlier today.  When she returned home, she was too weak to be able to get up the stairs into her home.  EMS was called.  There, she was noted to have low blood pressure at 90/40, it was recommended that she present back to the ER.  Patient notes that while she was here she was evaluated by physical therapy who did think that she would benefit from rehab, but thinks that her insurance denied this so instead she was discharged with plans for home health.  Patient and family at bedside both concerned as they do not feel patient can get into her home or take care of herself.   Reviewed recent discharge summary.  Patient admitted with nausea and vomiting and difficulty swallowing.  Had a EGD with esophageal dilation.  Had low normal blood pressures while admitted.      Physical Exam   Triage Vital Signs: ED Triage Vitals  Encounter Vitals Group     BP 01/11/24 1947 121/80     Girls Systolic BP Percentile --      Girls Diastolic BP Percentile --      Boys Systolic BP Percentile --      Boys Diastolic BP Percentile --      Pulse Rate 01/11/24 1947 90     Resp 01/11/24 1947 18     Temp 01/11/24 1947 98.2 F (36.8 C)     Temp src --      SpO2 01/11/24 1947 100 %     Weight 01/11/24 1821 140 lb (63.5 kg)     Height 01/11/24 1821 5' 5 (1.651 m)     Head Circumference --      Peak Flow --      Pain Score 01/11/24 1820 0     Pain Loc --      Pain Education --      Exclude from Growth Chart --     Most recent vital signs: Vitals:   01/11/24 2200 01/11/24 2230  BP: 107/80 121/87  Pulse:     Resp:    Temp:    SpO2:       General: Awake, interactive  CV:  Regular rate, good peripheral perfusion.  Resp:  Unlabored respirations.  Abd:  Nondistended.  Neuro:  Symmetric facial movement, fluid speech   ED Results / Procedures / Treatments   Labs (all labs ordered are listed, but only abnormal results are displayed) Labs Reviewed  COMPREHENSIVE METABOLIC PANEL WITH GFR - Abnormal; Notable for the following components:      Result Value   Glucose, Bld 103 (*)    Creatinine, Ser 1.08 (*)    Total Protein 6.1 (*)    Albumin 2.6 (*)    GFR, Estimated 54 (*)    All other components within normal limits  CBC - Abnormal; Notable for the following components:   RBC 3.37 (*)    Hemoglobin 11.1 (*)    HCT 35.3 (*)    MCV 104.7 (*)    All other components within normal limits  URINALYSIS, ROUTINE W REFLEX MICROSCOPIC  CBG MONITORING, ED     EKG EKG independently reviewed and interpreted by myself demonstrates:  EKG demonstrates normal sinus rhythm rate of 89, PR 154, QRS 80, QTc 438, no acute ST changes  RADIOLOGY Imaging independently reviewed and interpreted by myself demonstrates:   Formal Radiology Read:  No results found.  PROCEDURES:  Critical Care performed: No  Procedures   MEDICATIONS ORDERED IN ED: Medications  albuterol  (VENTOLIN  HFA) 108 (90 Base) MCG/ACT inhaler 1 puff (has no administration in time range)  ARIPiprazole  (ABILIFY ) tablet 5 mg (has no administration in time range)  azelastine  (ASTELIN ) 0.1 % nasal spray 1 spray (has no administration in time range)  clopidogrel  (PLAVIX ) tablet 75 mg (has no administration in time range)  fluticasone  furoate-vilanterol (BREO ELLIPTA ) 100-25 MCG/INH 1 puff (has no administration in time range)  montelukast  (SINGULAIR ) tablet 10 mg (has no administration in time range)  pantoprazole  (PROTONIX ) EC tablet 40 mg (has no administration in time range)  ondansetron  (ZOFRAN ) tablet 4 mg (has no  administration in time range)  Potassium Chloride  40 MEQ/15ML (20%) SOLN 20 mEq (has no administration in time range)  QUEtiapine  (SEROQUEL ) tablet 25 mg (has no administration in time range)  rosuvastatin  (CRESTOR ) tablet 20 mg (has no administration in time range)  sodium chloride  0.9 % bolus 1,000 mL (1,000 mLs Intravenous New Bag/Given 01/11/24 2217)     IMPRESSION / MDM / ASSESSMENT AND PLAN / ED COURSE  I reviewed the triage vital signs and the nursing notes.  Differential diagnosis includes, but is not limited to, weakness related to recent nausea and vomiting, AKI, anemia, electrolyte abnormality, UTI  Patient's presentation is most consistent with acute presentation with potential threat to life or bodily function.  73 year old female presenting with weakness.  Stable vitals on presentation here.  Labs with improved anemia, stable mild renal impairment.  Given recent discharge, I did review the case with Dr. Lawence with hospitalist team.  He agrees that patient likely needs social work consult for placement, but does not feel patient currently has medical indication for admission.  Will consult TOC and place boarder order set.       FINAL CLINICAL IMPRESSION(S) / ED DIAGNOSES   Final diagnoses:  Generalized weakness     Rx / DC Orders   ED Discharge Orders     None        Note:  This document was prepared using Dragon voice recognition software and may include unintentional dictation errors.   Levander Slate, MD 01/12/24 [phone]

## 2024-01-11 NOTE — ED Triage Notes (Signed)
 Pt to ED via [facility] from home. Pt was discharged 2hrs from ortho floor for low K+. Pt arrived home and family was not able to get pt in house and realized they cannot care for her. Pt reports increased weakness. Insurance denied stay at rehab.   EMS  90/40 80 HR

## 2024-01-11 NOTE — TOC Transition Note (Signed)
 Transition of Care [facility]) - Discharge Note   Patient Details  Name: Jenna Carlson MRN: [id number] Date of Birth: January 04, 1951  Transition of Care [facility]) CM/SW Contact:  Dalia GORMAN Fuse, RN Phone Number: 01/11/2024, 3:11 PM   Clinical Narrative:     Patient is medically clear to discharge to home with home health. Request to resume [facility] PT/OT called to [facility]. [facility] accepted. No other TOC needs.   Final next level of care: Home w Home Health Services Barriers to Discharge: Barriers Resolved   Patient Goals and CMS Choice            Discharge Placement                       Discharge Plan and Services Additional resources added to the After Visit Summary for                            [facility] Arranged: PT, OT HH Agency: Other - See comment ([facility]) Date HH Agency Contacted: 01/11/24 Time HH Agency Contacted: 1511 Representative spoke with at [facility] Agency: [facility]  Social Drivers of Health (SDOH) Interventions SDOH Screenings   Food Insecurity: No Food Insecurity (01/03/2024)  Housing: Low Risk  (01/03/2024)  Transportation Needs: No Transportation Needs (01/03/2024)  Utilities: Not At Risk (01/03/2024)  Financial Resource Strain: Low Risk  (12/18/2023)   Received from [facility]  Social Connections: Moderately Isolated (01/03/2024)  Tobacco Use: Low Risk  (01/07/2024)  Recent Concern: Tobacco Use - Medium Risk (12/18/2023)   Received from [facility]     Readmission Risk Interventions     No data to display

## 2024-01-11 NOTE — Progress Notes (Signed)
+  Mobility Specialist - Progress Note     01/11/24 1000  Mobility  Activity Ambulated independently in hallway  Level of Assistance Modified independent, requires aide device or extra time  Assistive Device Front wheel walker  Distance Ambulated (ft) 160 ft  Range of Motion/Exercises Active  Activity Response Tolerated well  Mobility Referral Yes  Mobility visit 1 Mobility  Mobility Specialist Start Time (ACUTE ONLY) 0930  Mobility Specialist Stop Time (ACUTE ONLY) 0947  Mobility Specialist Time Calculation (min) (ACUTE ONLY) 17 min   Pt resting in bed on RA upon entry. Pt STS and ambulates to hallway around NS ModI with RW. Pt returned to bed and left with needs in reach.   Guido Rumble Mobility Specialist 01/11/24, 11:02 AM

## 2024-01-11 NOTE — Discharge Summary (Signed)
 Physician Discharge Summary   Patient: Jenna Carlson MRN: [id number]  DOB: 29-Nov-1950   Admit:     Date of Admission: 01/02/2024 Admitted from: home   Discharge: Date of discharge: 01/11/24 Disposition: Home w/ home health Condition at discharge: good  CODE STATUS: DNR     Discharge Physician: Laneta Blunt, DO [facility]     PCP: Lenon Layman ORN, MD  Recommendations for Outpatient Follow-up:  Follow up with PCP Lenon Layman ORN, MD in 1-2 weeks    Discharge Instructions     Diet general   Complete by: As directed    Increase activity slowly   Complete by: As directed          Discharge Diagnoses: Principal Problem:   Intractable nausea and vomiting Active Problems:   AKI (acute kidney injury) (HCC)   History of esophageal stenosis s/p dilatation   History of peptic ulcer   Hypokalemia   Chronic diarrhea   Essential hypertension   Asthma, chronic   Personal history of chemotherapy   Dysphagia   Stricture and stenosis of esophagus       Hospital course / significant events:   HPI: Jenna Carlson is a 73 y.o. female with medical history significant for Hypertension, asthma, breast cancer s/p chemo, history of PUD and esophageal stenosis with last dilatation 07/2022 being admitted with nausea and vomiting and difficulty swallowing.  She was admitted for the same a few weeks prior. She states that pills get stuck in her throat. She denies abdominal pain. Has baseline chronic diarrhea for which she sees a gastroenterologist and has an upcoming colonoscopy.   07/17: admitted to hospitalist. Plan EGD pending Plavix  washout.  07/21: EGD w/ esophageal dilation 07/22-07/24: pending STR placement      Consultants:  Gastroenterology   Procedures/Surgeries: 07/21: EGD w/ esophageal dilation       ASSESSMENT & PLAN:   Nausea and vomiting Dysphagia with history of esophageal stenosis s/p dilatation History of peptic  ulcer disease S/p esophageal dilation on 7/21 cont soft diet advance as tolerate  cont PPI outpatient f/u with GI Dr. Onita  Restarted plavix     AKI, ruled out CKD 3a Monitor BMP periodically   Hypokalemia Replace as needed Monitor BMP   History of chemotherapy History of breast cancer No acute issues suspected   Asthma, chronic Not acutely exacerbated Albuterol  as needed   Essential hypertension cont coreg   Pt not taking Lisinopril  at home, BP has been on the low side back on this rx, will dc and defer to PCP re restart or discontonue    Chronic diarrhea Followed by GI.   C diff and GI path neg Imodium  PRN outpatient f/u with Dr. Onita    overweight based on BMI: Body mass index is 25.79 kg/m.SABRA Significantly low or high BMI is associated with higher medical risk.  Underweight - under 18  overweight - 25 to 29 obese - 30 or more Class 1 obesity: BMI of 30.0 to 34 Class 2 obesity: BMI of 35.0 to 39 Class 3 obesity: BMI of 40.0 to 49 Super Morbid Obesity: BMI 50-59 Super-super Morbid Obesity: BMI 60+ Healthy nutrition and physical activity advised as adjunct to other disease management and risk reduction treatments    DVT prophylaxis: lovenox  IV fluids: no continuous IV fluids  Nutrition: soft diet Central lines / other devices: noen  Code Status: DNR ACP documentation reviewed:  none on file in [facility]  [facility] needs: SNF STR placement Medical  barriers to dispo: none.             Discharge Instructions  Allergies as of 01/11/2024       Reactions   Iodinated Contrast Media Shortness Of Breath   Aspirin Tinitus, Other (See Comments)   Tinnitus   Codeine    Hydrochlorothiazide Other (See Comments)   Morphine And Codeine Nausea And Vomiting        Medication List     STOP taking these medications    lisinopril  20 MG tablet Commonly known as: ZESTRIL        TAKE these medications    acetaminophen  650 MG CR tablet Commonly known as:  TYLENOL  Take 650 mg by mouth every 8 (eight) hours as needed.   albuterol  108 (90 Base) MCG/ACT inhaler Commonly known as: VENTOLIN  HFA Inhale 1 puff into the lungs every 4 (four) hours as needed.   ARIPiprazole  5 MG tablet Commonly known as: ABILIFY  Take 5 mg by mouth daily. What changed: Another medication with the same name was removed. Continue taking this medication, and follow the directions you see here.   azelastine  0.1 % nasal spray Commonly known as: ASTELIN  Place into both nostrils 2 (two) times daily.   Breo Ellipta  100-25 MCG/INH Aepb Generic drug: fluticasone  furoate-vilanterol Inhale 1 puff into the lungs daily.   carvedilol  6.25 MG tablet Commonly known as: COREG  Take 6.25 mg by mouth 2 (two) times daily with a meal.   clopidogrel  75 MG tablet Commonly known as: PLAVIX  Take 75 mg by mouth daily.   EPINEPHrine 0.3 mg/0.3 mL Soaj injection Commonly known as: EPI-PEN Inject 0.3 mg into the muscle as needed.   feeding supplement Liqd Take 237 mLs by mouth 2 (two) times daily between meals.   hyoscyamine 0.125 MG SL tablet Commonly known as: LEVSIN SL Take 0.125 mg by mouth every 6 (six) hours as needed.   montelukast  10 MG tablet Commonly known as: SINGULAIR  Take 10 mg by mouth at bedtime.   ondansetron  4 MG tablet Commonly known as: ZOFRAN  Take 4 mg by mouth every 8 (eight) hours as needed.   pantoprazole  40 MG tablet Commonly known as: PROTONIX  Take 40 mg by mouth daily.   Potassium Chloride  40 MEQ/15ML (20%) Soln Use as directed 20 mEq in the mouth or throat daily.   PreserVision AREDS 2 Caps Take 1 capsule by mouth 2 (two) times daily.   QUEtiapine  25 MG tablet Commonly known as: SEROQUEL  Take 25 mg by mouth at bedtime.   rosuvastatin  20 MG tablet Commonly known as: CRESTOR  Take 20 mg by mouth daily.         Contact information for follow-up providers     Lenon Layman ORN, MD Follow up.   Specialty: Internal Medicine Why:  hospital follow up Contact information: [address] [facility] [place] [address] [phone]              Contact information for after-discharge care     Destination     [facility] .   Service: Skilled Nursing Contact information: [address] [place]  [address] [phone]                     Allergies  Allergen Reactions   Iodinated Contrast Media Shortness Of Breath   Aspirin Tinitus and Other (See Comments)    Tinnitus   Codeine    Hydrochlorothiazide Other (See Comments)   Morphine And Codeine  Nausea And Vomiting     Subjective: pt feeling well this morning, no pain, tolerating diet, walking laps around the nurses station / hallway. No CP/SOB   Discharge Exam: BP 129/77 (BP Location: Left Arm)   Pulse 73   Temp (!) 97.4 F (36.3 C)   Resp 18   Ht 5' 5 (1.651 m)   Wt 70.3 kg   SpO2 96%   BMI 25.79 kg/m  General: Pt is alert, awake, not in acute distress Cardiovascular: RRR, S1/S2 +, no rubs, no gallops Respiratory: CTA bilaterally, no wheezing, no rhonchi Abdominal: Soft, NT, ND, bowel sounds + Extremities: no edema, no cyanosis     The results of significant diagnostics from this hospitalization (including imaging, microbiology, ancillary and laboratory) are listed below for reference.     Microbiology: Recent Results (from the past 240 hours)  C Difficile Quick Screen w PCR reflex     Status: None   Collection Time: 01/05/24 10:15 AM   Specimen: STOOL  Result Value Ref Range Status   C Diff antigen NEGATIVE NEGATIVE Final   C Diff toxin NEGATIVE NEGATIVE Final   C Diff interpretation No C. difficile detected.  Final    Comment: Performed at [facility], [address] [place], [place] [address]  Gastrointestinal Panel by PCR , Stool     Status: None   Collection Time: 01/05/24 10:15 AM   Specimen: Stool  Result Value Ref Range Status    Campylobacter species NOT DETECTED NOT DETECTED Final   Plesimonas shigelloides NOT DETECTED NOT DETECTED Final   Salmonella species NOT DETECTED NOT DETECTED Final   Yersinia enterocolitica NOT DETECTED NOT DETECTED Final   Vibrio species NOT DETECTED NOT DETECTED Final   Vibrio cholerae NOT DETECTED NOT DETECTED Final   Enteroaggregative E coli (EAEC) NOT DETECTED NOT DETECTED Final   Enteropathogenic E coli (EPEC) NOT DETECTED NOT DETECTED Final   Enterotoxigenic E coli (ETEC) NOT DETECTED NOT DETECTED Final   Shiga like toxin producing E coli (STEC) NOT DETECTED NOT DETECTED Final   Shigella/Enteroinvasive E coli (EIEC) NOT DETECTED NOT DETECTED Final   Cryptosporidium NOT DETECTED NOT DETECTED Final   Cyclospora cayetanensis NOT DETECTED NOT DETECTED Final   Entamoeba histolytica NOT DETECTED NOT DETECTED Final   Giardia lamblia NOT DETECTED NOT DETECTED Final   Adenovirus F40/41 NOT DETECTED NOT DETECTED Final   Astrovirus NOT DETECTED NOT DETECTED Final   Norovirus GI/GII NOT DETECTED NOT DETECTED Final   Rotavirus A NOT DETECTED NOT DETECTED Final   Sapovirus (I, II, IV, and V) NOT DETECTED NOT DETECTED Final    Comment: Performed at [facility], [address] [place], [place] [address]     Labs: BNP (last 3 results) No results for input(s): BNP in the last 8760 hours. Basic Metabolic Panel: Recent Labs  Lab 01/05/24 0439 01/07/24 0531 01/10/24 0436  NA 139 137 139  K 3.7 3.9 3.4*  CL 113* 105 107  CO2 21* 22 25  GLUCOSE 81 80 88  BUN 9 <5* 6*  CREATININE 1.19* 1.01* 0.92  CALCIUM  7.6* 8.2* 8.8*  MG 1.7 1.7  --    Liver Function Tests: No results for input(s): AST, ALT, ALKPHOS, BILITOT, PROT, ALBUMIN in the last 168 hours. No results for input(s): LIPASE, AMYLASE in the last 168 hours. No results for input(s): AMMONIA in the last 168 hours. CBC: Recent Labs  Lab 01/05/24 0439 01/07/24 0812 01/09/24 0428 01/10/24 0436  WBC  8.5 9.0  7.8 8.0  HGB 8.9* 10.5* 9.3* 9.1*  HCT 27.2* 34.0* 28.7* 27.8*  MCV 102.3* 105.6* 103.2* 100.7*  PLT 207 223 208 212   Cardiac Enzymes: No results for input(s): CKTOTAL, CKMB, CKMBINDEX, TROPONINI in the last 168 hours. BNP: Invalid input(s): POCBNP CBG: No results for input(s): GLUCAP in the last 168 hours. D-Dimer No results for input(s): DDIMER in the last 72 hours. Hgb A1c No results for input(s): HGBA1C in the last 72 hours. Lipid Profile No results for input(s): CHOL, HDL, LDLCALC, TRIG, CHOLHDL, LDLDIRECT in the last 72 hours. Thyroid  function studies No results for input(s): TSH, T4TOTAL, T3FREE, THYROIDAB in the last 72 hours.  Invalid input(s): FREET3 Anemia work up No results for input(s): VITAMINB12, FOLATE, FERRITIN, TIBC, IRON, RETICCTPCT in the last 72 hours. Urinalysis    Component Value Date/Time   COLORURINE YELLOW (A) 01/03/2024 0329   APPEARANCEUR HAZY (A) 01/03/2024 0329   LABSPEC 1.010 01/03/2024 0329   PHURINE 6.0 01/03/2024 0329   GLUCOSEU NEGATIVE 01/03/2024 0329   HGBUR NEGATIVE 01/03/2024 0329   BILIRUBINUR NEGATIVE 01/03/2024 0329   KETONESUR NEGATIVE 01/03/2024 0329   PROTEINUR NEGATIVE 01/03/2024 0329   NITRITE NEGATIVE 01/03/2024 0329   LEUKOCYTESUR TRACE (A) 01/03/2024 0329   Sepsis Labs Recent Labs  Lab 01/05/24 0439 01/07/24 0812 01/09/24 0428 01/10/24 0436  WBC 8.5 9.0 7.8 8.0   Microbiology Recent Results (from the past 240 hours)  C Difficile Quick Screen w PCR reflex     Status: None   Collection Time: 01/05/24 10:15 AM   Specimen: STOOL  Result Value Ref Range Status   C Diff antigen NEGATIVE NEGATIVE Final   C Diff toxin NEGATIVE NEGATIVE Final   C Diff interpretation No C. difficile detected.  Final    Comment: Performed at [facility], [address] [place], [place] [address]  Gastrointestinal Panel by PCR , Stool     Status: None   Collection  Time: 01/05/24 10:15 AM   Specimen: Stool  Result Value Ref Range Status   Campylobacter species NOT DETECTED NOT DETECTED Final   Plesimonas shigelloides NOT DETECTED NOT DETECTED Final   Salmonella species NOT DETECTED NOT DETECTED Final   Yersinia enterocolitica NOT DETECTED NOT DETECTED Final   Vibrio species NOT DETECTED NOT DETECTED Final   Vibrio cholerae NOT DETECTED NOT DETECTED Final   Enteroaggregative E coli (EAEC) NOT DETECTED NOT DETECTED Final   Enteropathogenic E coli (EPEC) NOT DETECTED NOT DETECTED Final   Enterotoxigenic E coli (ETEC) NOT DETECTED NOT DETECTED Final   Shiga like toxin producing E coli (STEC) NOT DETECTED NOT DETECTED Final   Shigella/Enteroinvasive E coli (EIEC) NOT DETECTED NOT DETECTED Final   Cryptosporidium NOT DETECTED NOT DETECTED Final   Cyclospora cayetanensis NOT DETECTED NOT DETECTED Final   Entamoeba histolytica NOT DETECTED NOT DETECTED Final   Giardia lamblia NOT DETECTED NOT DETECTED Final   Adenovirus F40/41 NOT DETECTED NOT DETECTED Final   Astrovirus NOT DETECTED NOT DETECTED Final   Norovirus GI/GII NOT DETECTED NOT DETECTED Final   Rotavirus A NOT DETECTED NOT DETECTED Final   Sapovirus (I, II, IV, and V) NOT DETECTED NOT DETECTED Final    Comment: Performed at [facility], [address] [place], [place] [address]   Imaging CT ABDOMEN PELVIS WO CONTRAST Result Date: 01/03/2024 CLINICAL DATA:  Acute abdominal pain and weakness, initial encounter EXAM: CT ABDOMEN AND PELVIS WITHOUT CONTRAST TECHNIQUE: Multidetector CT imaging of the abdomen and pelvis was performed following  the standard protocol without IV contrast. RADIATION DOSE REDUCTION: This exam was performed according to the departmental dose-optimization program which includes automated exposure control, adjustment of the mA and/or kV according to patient size and/or use of iterative reconstruction technique. COMPARISON:  01/23/2017 FINDINGS: Lower chest: Lung bases  are free of acute infiltrate or sizable effusion. A small ground-glass nodule is noted best seen on image number 3 of series 4 measuring 6.5 mm in greatest dimension. Hepatobiliary: No focal liver abnormality is seen. No gallstones, gallbladder wall thickening, or biliary dilatation. Pancreas: Unremarkable. No pancreatic ductal dilatation or surrounding inflammatory changes. Spleen: Normal in size without focal abnormality. Adrenals/Urinary Tract: Adrenal glands are within normal limits. Kidneys are well visualized bilaterally. No renal calculi or obstructive changes are seen. The ureter is within normal limits. Bladder is well distended. Stomach/Bowel: Scattered diverticular change of the colon is noted without evidence of diverticulitis. The appendix is within normal limits. No inflammatory changes are seen. Small bowel and stomach are unremarkable with the exception of a sliding-type hiatal hernia. Vascular/Lymphatic: Aortic atherosclerosis. No enlarged abdominal or pelvic lymph nodes. Reproductive: Uterus and bilateral adnexa are unremarkable. Other: No abdominal wall hernia or abnormality. No abdominopelvic ascites. Musculoskeletal: No acute or significant osseous findings. IMPRESSION: Diverticulosis without diverticulitis. Ground-glass pulmonary nodule measuring 7 mm. Per Fleischner Society Guidelines, recommend a non-contrast Chest CT at 6-12 months to confirm persistence, then additional non-contrast Chest CTs every 2 years until 5 years. If nodule grows or develops solid component(s), consider resection. These guidelines do not apply to immunocompromised patients and patients with cancer. Follow up in patients with significant comorbidities as clinically warranted. For lung cancer screening, adhere to Lung-RADS guidelines. Reference: Radiology. 2017; 284(1):228-43. Electronically Signed   By: Oneil Devonshire M.D.   On: 01/03/2024 00:50      Time coordinating discharge: over 30  minutes  SIGNED:  Jnae Thomaston DO [facility]

## 2024-01-12 LAB — URINALYSIS, ROUTINE W REFLEX MICROSCOPIC
Bilirubin Urine: NEGATIVE
Glucose, UA: NEGATIVE mg/dL
Ketones, ur: NEGATIVE mg/dL
Nitrite: NEGATIVE
Protein, ur: 30 mg/dL — AB
Specific Gravity, Urine: 1.011 (ref 1.005–1.030)
WBC, UA: >50 WBC/hpf (ref 0–5)
pH: 5 (ref 5.0–8.0)

## 2024-01-12 MED ORDER — FLUTICASONE FUROATE-VILANTEROL 100-25 MCG/ACT IN AEPB
1.0000 | INHALATION_SPRAY | Freq: Every day | RESPIRATORY_TRACT | Status: DC
  Administered 2024-01-14 – 2024-01-18 (×5): 1 via RESPIRATORY_TRACT
  Filled 2024-01-12 (×2): qty 28

## 2024-01-12 MED ORDER — QUETIAPINE FUMARATE 25 MG PO TABS
25.0000 mg | ORAL_TABLET | Freq: Every day | ORAL | Status: DC
  Administered 2024-01-12 – 2024-01-17 (×7): 25 mg via ORAL
  Filled 2024-01-12 (×7): qty 1

## 2024-01-12 MED ORDER — ROSUVASTATIN CALCIUM 20 MG PO TABS
20.0000 mg | ORAL_TABLET | Freq: Every day | ORAL | Status: DC
  Administered 2024-01-12 – 2024-01-18 (×7): 20 mg via ORAL
  Filled 2024-01-12 (×7): qty 1

## 2024-01-12 MED ORDER — ALBUTEROL SULFATE HFA 108 (90 BASE) MCG/ACT IN AERS: 1.0000 | INHALATION_SPRAY | RESPIRATORY_TRACT | Status: DC | PRN

## 2024-01-12 MED ORDER — PANTOPRAZOLE SODIUM 40 MG PO TBEC
40.0000 mg | DELAYED_RELEASE_TABLET | Freq: Every day | ORAL | Status: DC
  Administered 2024-01-12 – 2024-01-18 (×7): 40 mg via ORAL
  Filled 2024-01-12 (×7): qty 1

## 2024-01-12 MED ORDER — ARIPIPRAZOLE 10 MG PO TABS
5.0000 mg | ORAL_TABLET | Freq: Every day | ORAL | Status: DC
  Administered 2024-01-12 – 2024-01-18 (×7): 5 mg via ORAL
  Filled 2024-01-12 (×7): qty 1

## 2024-01-12 MED ORDER — MONTELUKAST SODIUM 10 MG PO TABS
10.0000 mg | ORAL_TABLET | Freq: Every day | ORAL | Status: DC
  Administered 2024-01-12 – 2024-01-17 (×7): 10 mg via ORAL
  Filled 2024-01-12 (×7): qty 1

## 2024-01-12 MED ORDER — CEPHALEXIN 500 MG PO CAPS: 500.0000 mg | ORAL_CAPSULE | Freq: Once | ORAL | Status: DC

## 2024-01-12 MED ORDER — CLOPIDOGREL BISULFATE 75 MG PO TABS
75.0000 mg | ORAL_TABLET | Freq: Every day | ORAL | Status: DC
  Administered 2024-01-12 – 2024-01-18 (×7): 75 mg via ORAL
  Filled 2024-01-12 (×7): qty 1

## 2024-01-12 MED ORDER — ONDANSETRON HCL 4 MG PO TABS: 4.0000 mg | ORAL_TABLET | Freq: Three times a day (TID) | ORAL | Status: DC | PRN

## 2024-01-12 MED ORDER — AZELASTINE HCL 0.1 % NA SOLN
1.0000 | Freq: Two times a day (BID) | NASAL | Status: DC
  Administered 2024-01-14 – 2024-01-18 (×9): 1 via NASAL
  Filled 2024-01-12 (×2): qty 30

## 2024-01-12 MED ORDER — CEPHALEXIN 500 MG PO CAPS
500.0000 mg | ORAL_CAPSULE | Freq: Four times a day (QID) | ORAL | Status: DC
  Administered 2024-01-12 – 2024-01-18 (×25): 500 mg via ORAL
  Filled 2024-01-12 (×24): qty 1

## 2024-01-12 MED ORDER — POTASSIUM CHLORIDE 20 MEQ/15ML (10%) PO SOLN
20.0000 meq | Freq: Every day | ORAL | Status: DC
  Administered 2024-01-12 – 2024-01-18 (×7): 20 meq via ORAL
  Filled 2024-01-12 (×7): qty 15

## 2024-01-12 MED ORDER — ALBUTEROL SULFATE (2.5 MG/3ML) 0.083% IN NEBU
2.5000 mg | INHALATION_SOLUTION | RESPIRATORY_TRACT | Status: DC | PRN
  Filled 2024-01-12: qty 3

## 2024-01-12 NOTE — Evaluation (Signed)
 Physical Therapy Evaluation Patient Details Name: Jenna Carlson MRN: [id number] DOB: [birth year] Today's Date: 01/12/2024  History of Present Illness  Jenna Carlson is a 73yoF who [facility]24 ~3 hours post DC from here after acute episode of weakness after exiting the car and walking over to her entry steps in garage. Pt reports first reponders getting BP 60s/40s. Pt previously admitted 7/16-7/25 for intracible N/V and underwent esophageal dilation with Dr. Jinny. Pt reports difficulty with strength, AMB, and activity intolerance over past few motnhs. While admitted her pt was progressively able to improve walking and transfers to just below houseold AMB threshold, but recommendations remained for STR due to multiple evidence based screening measures that indicated safety concerns with direct return to home. Unfortunately insurance denied STR/SNF placement and arrangements were made for return to home.  Clinical Impression  Pt continues to have generalized weakness and walking intolerance, acute exacerbated overtop of a chronic problem. Furthermore pt has had multiple hospital admissions pertaining to weakness, dehydration, and symptomatic dehydration. Pt has a similar episode of weakness and inability to progress from car back into house after a PCP vist that resulted in an ED visit here on 12/18/23. Pt is limited in her ability to perform transfers and AMB at this point, particularly concerning for a patient living alone. PT recommendations remain similar to prior to DC yesterday, still very much appropriate for short term rehab, high frequency rehab services at SNF to assure a safe, eventual return to home alone. Will continue to follow.   Orthostatic VS for the past 24 hrs:  BP- Lying Pulse- Lying BP- Sitting Pulse- Sitting  01/12/24 1351 106/70 81 102/69 96          If plan is discharge home, recommend the following: A little help with walking and/or transfers;A little help with  bathing/dressing/bathroom;Assist for transportation;Help with stairs or ramp for entrance   Can travel by private vehicle   Yes    Equipment Recommendations None recommended by PT  Recommendations for Other Services       Functional Status Assessment Patient has had a recent decline in their functional status and demonstrates the ability to make significant improvements in function in a reasonable and predictable amount of time.     Precautions / Restrictions Precautions Precautions: Fall Recall of Precautions/Restrictions: Intact Restrictions Weight Bearing Restrictions Per Provider Order: No      Mobility  Bed Mobility Overal bed mobility: Needs Assistance Bed Mobility: Supine to Sit, Sit to Supine     Supine to sit: Supervision Sit to supine: Supervision   General bed mobility comments: labored but largely successful with gross motor aspects    Transfers Overall transfer level: Needs assistance Equipment used: Rolling walker (2 wheels) Transfers: Sit to/from Stand Sit to Stand: Contact guard assist, From elevated surface           General transfer comment: ED gurney elevation    Ambulation/Gait Ambulation/Gait assistance: Contact guard assist Gait Distance (Feet): 80 Feet Assistive device: Rolling walker (2 wheels)   Gait velocity: 0.64m/s whereas 0.39m/s 4 days previous to this.     General Gait Details: slow and weak appearing, but consistent pacing  Stairs            Wheelchair Mobility     Tilt Bed    Modified Rankin (Stroke Patients Only)       Balance  Pertinent Vitals/Pain Pain Assessment Pain Assessment: No/denies pain    Home Living Family/patient expects to be discharged to:: Skilled nursing facility Living Arrangements: Alone Available Help at Discharge: Friend(s);Available PRN/intermittently Type of Home: House Home Access: Stairs to enter Entrance  Stairs-Rails: Can reach both;Left;Right Entrance Stairs-Number of Steps: 5   Home Layout: Two level;Able to live on main level with bedroom/bathroom Home Equipment: Toilet riser;Shower Counsellor (2 wheels);Rollator (4 wheels);Cane - single point;Hand held shower head Additional Comments: has an aide come in x1/wk to help her shower, currently has [facility] PT/OT come x2/wk    Prior Function Prior Level of Function : Independent/Modified Independent             Mobility Comments: friends assist with driving for errands/groceries lately. ambulatory with [facility] especially the last couple of weeks; uses [facility] to get to her car for appts and then transport/WC for appts ADLs Comments: independent, has meds delivered     Extremity/Trunk Assessment                Communication        Cognition                                         Cueing       General Comments      Exercises     Assessment/Plan    PT Assessment Patient needs continued PT services  PT Problem List Decreased strength;Decreased activity tolerance;Decreased mobility       PT Treatment Interventions DME instruction;Gait training;Stair training;Functional mobility training;Therapeutic activities;Therapeutic exercise;Patient/family education    PT Goals (Current goals can be found in the Care Plan section)  Acute Rehab PT Goals Patient Stated Goal: improve mobility and strength for independent mobility in the home. PT Goal Formulation: With patient Time For Goal Achievement: 01/26/24 Potential to Achieve Goals: Good    Frequency Min 2X/week     Co-evaluation               AM-PAC PT 6 Clicks Mobility  Outcome Measure Help needed turning from your back to your side while in a flat bed without using bedrails?: A Little Help needed moving from lying on your back to sitting on the side of a flat bed without using bedrails?: A Little Help needed moving to and from a bed to a  chair (including a wheelchair)?: A Lot Help needed standing up from a chair using your arms (e.g., wheelchair or bedside chair)?: A Lot Help needed to walk in hospital room?: A Little Help needed climbing 3-5 steps with a railing? : A Lot 6 Click Score: 15    End of Session Equipment Utilized During Treatment: Gait belt Activity Tolerance: Patient limited by fatigue Patient left: with call bell/phone within reach;in bed Nurse Communication: Mobility status PT Visit Diagnosis: Other abnormalities of gait and mobility (R26.89);Muscle weakness (generalized) (M62.81);Difficulty in walking, not elsewhere classified (R26.2)    Time: 8690-8669 PT Time Calculation (min) (ACUTE ONLY): 21 min   Charges:   PT Evaluation $PT Eval Moderate Complexity: 1 Mod   PT General Charges $$ ACUTE PT VISIT: 1 Visit        2:19 PM, 01/12/24 Peggye JAYSON Linear, PT, DPT Physical Therapist - [facility]  [phone] (ASCOM)    Sally Reimers C 01/12/2024, 2:10 PM

## 2024-01-12 NOTE — ED Provider Notes (Signed)
 Emergency Medicine Observation Re-evaluation Note  Jenna Carlson is a 73 y.o. female, seen on rounds today.  Pt initially presented to the ED for complaints of Weakness  Currently, the patient is resting in bed. No reported issues from nursing team.   Physical Exam  BP 131/76 (BP Location: Left Arm)   Pulse 75   Temp 97.8 F (36.6 C) (Oral)   Resp 17   Ht 5' 5 (1.651 m)   Wt 63.5 kg   SpO2 100%   BMI 23.30 kg/m  Physical Exam General: Resting in bed  ED Course / MDM   CBC and CMP from yesterday without significant change.  Did have urinalysis this morning that is concerning for infection.  Will add on urine culture.  No evidence of sepsis. Ordered for oral antibiotics with Keflex .  Plan  Current plan is for dispo per social work. Seen by PT and OT, continued recommendation for short term rehab or high frequency rehab at [facility].     Levander Slate, MD 01/12/24 [phone]

## 2024-01-12 NOTE — TOC Initial Note (Signed)
 Transition of Care [facility]) - Initial/Assessment Note    Patient Details  Name: Jenna Carlson MRN: [id number] Date of Birth: [birth year]  Transition of Care [facility]) CM/SW Contact:    Edsel Jenna Fischer, LCSW Phone Number: 01/12/2024, 5:10 PM  Clinical Narrative:                  SW met with pt at ED.  Per pt report:   Pt lives alone.  Safety concern: falling, legs are weak.  PCP: Dr. Lenon and Pharmacy: [facility] in [place], [place].  [facility] history: 2019- [facility], 2025- [facility].  Transportation:  Pt stated that she has a car but hasn't drove since I was sick.  Social support: friend.  No financial concerns or issues paying for medications.  Incontinence: no, Medication Adherence: pt stated that she takes medication as prescribed.  Oxygen/ CPAP: no.  Dialysis: no  Pt was previously admitted into the ED low potassium, dehydration and weak.  Pt stated that she does not cook because she gets exhausted.  Pt stated that she does not always eat breakfast and food is brought to her for lunch/ dinner by friends.  Pt stated its either something to make a sandwich or go into microwave.  Pt stated that reason for this ED visit was blood pressure 90/60. Pt stated when she return home, as she was walking up her steps she had to sit down, I could not make it inside, light headed and didn't feel as though my legs could make it up the steps.  Pt stated that she has weak moments, I dont have the energy or strength.  Pt stated that she believes she would benefit from rehab at facility to help build up strength in legs.  Pt stated she prefers [address] or [address].  Pt does not want to go to [facility] or [facility].  SW to assist with completing process for rehab.  Pt was recently denied for rehab by insurance.  Pt stated that if denied again she would like to appeal.    Barriers to Discharge: Continued Medical Work up   Patient Goals and CMS Choice   CMS Medicare.gov Compare Post  Acute Care list provided to::  (Pt stated that she would like to rehab at [facility] or [facility])        Expected Discharge Plan and Services In-house Referral: Clinical Social Work     Living arrangements for the past 2 months: Single Family Home                                      Prior Living Arrangements/Services Living arrangements for the past 2 months: Single Family Home Lives with:: Self Patient language and need for interpreter reviewed:: Yes Do you feel safe going back to the place where you live?: Yes          Current home services: DME, Other (comment) (walker, walker with tray, cane, shower chair) Criminal Activity/Legal Involvement Pertinent to Current Situation/Hospitalization: No - Comment as needed  Activities of Daily Living      Permission Sought/Granted                  Emotional Assessment Appearance:: Appears stated age Attitude/Demeanor/Rapport: Lethargic Affect (typically observed): Appropriate Orientation: : Oriented to Situation, Oriented to  Time, Oriented to Place, Oriented to Self Alcohol / Substance Use: Not Applicable Psych Involvement: No (comment)  Admission diagnosis:  weakness Patient Active Problem List   Diagnosis Date Noted   Stricture and stenosis of esophagus 01/07/2024   Dysphagia 01/04/2024   Intractable nausea and vomiting 01/03/2024   History of esophageal stenosis s/p dilatation 01/03/2024   History of peptic ulcer 01/03/2024   Personal history of chemotherapy    Dyslipidemia 12/19/2023   Major depression 12/19/2023   Acute lower UTI 11/16/2023   AKI (acute kidney injury) (HCC) 11/16/2023   Essential hypertension 11/16/2023   GERD without esophagitis 11/16/2023   Depression 11/16/2023   Asthma, chronic 11/16/2023   Generalized weakness 11/15/2023   Elevated gastrin level 06/01/2017   Chronic diarrhea    Weakness 05/16/2017   Hypokalemia 05/16/2017   Multiple duodenal ulcers 05/16/2017   HTN  (hypertension) 05/16/2017   HLD (hyperlipidemia) 05/16/2017   PCP:  Lenon Layman ORN, MD Pharmacy:   [facility] - [place], [place] - [address] [place] [address] Phone: [phone] Fax: [phone]     Social Drivers of Health (SDOH) Social History: SDOH Screenings   Food Insecurity: No Food Insecurity (01/03/2024)  Housing: Low Risk  (01/03/2024)  Transportation Needs: No Transportation Needs (01/03/2024)  Utilities: Not At Risk (01/03/2024)  Financial Resource Strain: Low Risk  (12/18/2023)   Received from [facility]  Social Connections: Moderately Isolated (01/03/2024)  Tobacco Use: Low Risk  (01/11/2024)  Recent Concern: Tobacco Use - Medium Risk (12/18/2023)   Received from [facility]   SDOH Interventions:     Readmission Risk Interventions     No data to display

## 2024-01-12 NOTE — NC FL2 (Signed)
 [place]  MEDICAID FL2 LEVEL OF CARE FORM     IDENTIFICATION  Patient Name: Jenna Carlson Birthdate: 11-25-1950 Sex: female Admission Date (Current Location): 01/11/2024  [facility] and [place] Number:  Chiropodist and Address:  [facility], [address], [place], [place] [address]      Provider Number: [id number]  Attending Physician Name and Address:  No att. providers found  Relative Name and Phone Number:  Dorthea Lesch [phone]    Current Level of Care: Hospital Recommended Level of Care: Skilled Nursing Facility Prior Approval Number:    Date Approved/Denied:   PASRR Number: [id number]  Discharge Plan: SNF    Current Diagnoses: Patient Active Problem List   Diagnosis Date Noted   Stricture and stenosis of esophagus 01/07/2024   Dysphagia 01/04/2024   Intractable nausea and vomiting 01/03/2024   History of esophageal stenosis s/p dilatation 01/03/2024   History of peptic ulcer 01/03/2024   Personal history of chemotherapy    Dyslipidemia 12/19/2023   Major depression 12/19/2023   Acute lower UTI 11/16/2023   AKI (acute kidney injury) (HCC) 11/16/2023   Essential hypertension 11/16/2023   GERD without esophagitis 11/16/2023   Depression 11/16/2023   Asthma, chronic 11/16/2023   Generalized weakness 11/15/2023   Elevated gastrin level 06/01/2017   Chronic diarrhea    Weakness 05/16/2017   Hypokalemia 05/16/2017   Multiple duodenal ulcers 05/16/2017   HTN (hypertension) 05/16/2017   HLD (hyperlipidemia) 05/16/2017    Orientation RESPIRATION BLADDER Height & Weight     Place, Situation, Time, Self    Continent Weight: 140 lb (63.5 kg) Height:  5' 5 (165.1 cm)  BEHAVIORAL SYMPTOMS/MOOD NEUROLOGICAL BOWEL NUTRITION STATUS      Continent Diet (soft)  AMBULATORY STATUS COMMUNICATION OF NEEDS Skin   Limited Assist (pt uses wlker) Verbally Normal                       Personal Care Assistance Level of  Assistance  Bathing, Feeding, Dressing Bathing Assistance: Limited assistance (pt uses shower chair, not able to stand due to weakness) Feeding assistance: Independent (pt is able to feed herself however pt is not able to cook due to exhaustion and feeling  weakness in legs and low energy) Dressing Assistance: Independent (pt is able to dress self however it may take time due to feelings of exhaustion , weakness and strength)     Functional Limitations Info    Sight Info: Adequate Hearing Info: Adequate Speech Info: Adequate    SPECIAL CARE FACTORS FREQUENCY  PT (By licensed PT), OT (By licensed OT)     PT Frequency: min 2x week OT Frequency: min 2x week            Contractures Contractures Info: Not present    Additional Factors Info  Code Status, Allergies Code Status Info: DNR Allergies Info: Iodinated Contrast Media,Hydrochlorothiazide,Aspirin,Codeine,Morphine And Codeine           Current Medications (01/12/2024):  This is the current hospital active medication list Current Facility-Administered Medications  Medication Dose Route Frequency Provider Last Rate Last Admin   albuterol  (PROVENTIL ) (2.5 MG/3ML) 0.083% nebulizer solution 2.5 mg  2.5 mg Nebulization Q4H PRN Dail Rankin RAMAN, RPH       ARIPiprazole  (ABILIFY ) tablet 5 mg  5 mg Oral Daily Ray, Neha, MD   5 mg at 01/12/24 9075   azelastine  (ASTELIN ) 0.1 % nasal spray 1 spray  1 spray Each Nare BID Ray, Neha,  MD       cephALEXin  (KEFLEX ) capsule 500 mg  500 mg Oral Q6H Ray, Neha, MD       clopidogrel  (PLAVIX ) tablet 75 mg  75 mg Oral Daily Ray, Neha, MD   75 mg at 01/12/24 9074   fluticasone  furoate-vilanterol (BREO ELLIPTA ) 100-25 MCG/ACT 1 puff  1 puff Inhalation Daily Ray, Neha, MD       montelukast  (SINGULAIR ) tablet 10 mg  10 mg Oral QHS Ray, Nilsa, MD   10 mg at 01/12/24 0059   ondansetron  (ZOFRAN ) tablet 4 mg  4 mg Oral Q8H PRN Levander Nilsa, MD       pantoprazole  (PROTONIX ) EC tablet 40 mg  40 mg Oral Daily Ray,  Nilsa, MD   40 mg at 01/12/24 0924   potassium chloride  20 MEQ/15ML (10%) solution 20 mEq  20 mEq Oral Daily Levander Nilsa, MD   20 mEq at 01/12/24 9075   QUEtiapine  (SEROQUEL ) tablet 25 mg  25 mg Oral QHS Levander Nilsa, MD   25 mg at 01/12/24 0059   rosuvastatin  (CRESTOR ) tablet 20 mg  20 mg Oral Daily Ray, Nilsa, MD   20 mg at 01/12/24 9074   Current Outpatient Medications  Medication Sig Dispense Refill   acetaminophen  (TYLENOL ) 650 MG CR tablet Take 650 mg by mouth every 8 (eight) hours as needed.     albuterol  (VENTOLIN  HFA) 108 (90 Base) MCG/ACT inhaler Inhale 1 puff into the lungs every 4 (four) hours as needed.     ARIPiprazole  (ABILIFY ) 5 MG tablet Take 5 mg by mouth daily.     azelastine  (ASTELIN ) 0.1 % nasal spray Place into both nostrils 2 (two) times daily.     carvedilol  (COREG ) 6.25 MG tablet Take 6.25 mg by mouth 2 (two) times daily with a meal.     clopidogrel  (PLAVIX ) 75 MG tablet Take 75 mg by mouth daily.     EPINEPHrine 0.3 mg/0.3 mL IJ SOAJ injection Inject 0.3 mg into the muscle as needed.     fluticasone  furoate-vilanterol (BREO ELLIPTA ) 100-25 MCG/INH AEPB Inhale 1 puff into the lungs daily.     montelukast  (SINGULAIR ) 10 MG tablet Take 10 mg by mouth at bedtime.     Multiple Vitamins-Minerals (PRESERVISION AREDS 2) CAPS Take 1 capsule by mouth 2 (two) times daily.     ondansetron  (ZOFRAN ) 4 MG tablet Take 4 mg by mouth every 8 (eight) hours as needed.     pantoprazole  (PROTONIX ) 40 MG tablet Take 40 mg by mouth daily.     Potassium Chloride  40 MEQ/15ML (20%) SOLN Use as directed 20 mEq in the mouth or throat daily.     QUEtiapine  (SEROQUEL ) 25 MG tablet Take 25 mg by mouth at bedtime.     rosuvastatin  (CRESTOR ) 20 MG tablet Take 20 mg by mouth daily.     sertraline  (ZOLOFT ) 50 MG tablet Take 50 mg by mouth daily.     feeding supplement (ENSURE PLUS HIGH PROTEIN) LIQD Take 237 mLs by mouth 2 (two) times daily between meals.     hyoscyamine (LEVSIN SL) 0.125 MG SL tablet Take  0.125 mg by mouth every 6 (six) hours as needed. (Patient not taking: Reported on 01/03/2024)       Discharge Medications: Please see discharge summary for a list of discharge medications.  Relevant Imaging Results:  Relevant Lab Results:   Additional Information SS# [id number], DOB 04/18/1951  Edsel [name], LCSW

## 2024-01-12 NOTE — ED Notes (Signed)
 Pt assisted to toilet. Pt ambulatory with stand-by assist.

## 2024-01-12 NOTE — Evaluation (Signed)
 Occupational Therapy Evaluation Patient Details Name: Jenna Carlson MRN: [id number] DOB: 11-02-1950 Today's Date: 01/12/2024   History of Present Illness   Jenna Carlson is a 73yoF who comes [facility]24 ~3 hours post DC from here after acute episode of weakness after exiting the car and walking over to her entry steps in garage. Pt reports first reponders getting BP 60s/40s. Pt previously admitted 7/16-7/25 for intracible N/V and underwent esophageal dilation with Dr. Jinny. Pt reports difficulty with strength, AMB, and activity intolerance over past few motnhs. While admitted her pt was progressively able to improve walking and transfers to just below houseold AMB threshold, but recommendations remained for STR due to multiple evidence based screening measures that indicated safety concerns with direct return to home. Unfortunately insurance denied STR/SNF placement and arrangements were made for return to home.     Clinical Impressions Patient presenting with decreased Ind in self care,balance, functional mobility/transfers, endurance, and safety awareness. Patient reports being Ind and living at home alone with family assisting with IADL tasks as needed. Pt recently left hospital after discharge and unable to remain at home and needing to return back after less than 24 hours. Pt performs bed mobility with supervision and ambulates with RW with CGA. Pt able to have BM on commode and perform hygiene with min guard.Pt returning back to ED stretcher at end of session.  Patient will benefit from acute OT to increase overall independence in the areas of ADLs, functional mobility, and safety awareness in order to safely discharge.     If plan is discharge home, recommend the following:   A little help with walking and/or transfers;A little help with bathing/dressing/bathroom;Assistance with cooking/housework;Assist for transportation;Help with stairs or ramp for entrance     Functional  Status Assessment   Patient has had a recent decline in their functional status and demonstrates the ability to make significant improvements in function in a reasonable and predictable amount of time.     Equipment Recommendations   None recommended by OT      Precautions/Restrictions   Precautions Precautions: Fall Recall of Precautions/Restrictions: Intact Restrictions Weight Bearing Restrictions Per Provider Order: No     Mobility Bed Mobility Overal bed mobility: Needs Assistance Bed Mobility: Supine to Sit, Sit to Supine     Supine to sit: Supervision Sit to supine: Supervision        Transfers Overall transfer level: Needs assistance Equipment used: Rolling walker (2 wheels) Transfers: Sit to/from Stand Sit to Stand: Contact guard assist, From elevated surface                  Balance Overall balance assessment: Needs assistance Sitting-balance support: Feet supported Sitting balance-Leahy Scale: Good     Standing balance support: Bilateral upper extremity supported, During functional activity, Reliant on assistive device for balance Standing balance-Leahy Scale: Fair                             ADL either performed or assessed with clinical judgement   ADL Overall ADL's : Needs assistance/impaired     Grooming: Wash/dry hands;Standing                   Toilet Transfer: Contact guard assist;Ambulation;Comfort height toilet;Rolling walker (2 wheels)   Toileting- Clothing Manipulation and Hygiene: Contact guard assist;Sit to/from stand               Vision Patient Visual Report:  No change from baseline              Pertinent Vitals/Pain Pain Assessment Pain Assessment: No/denies pain     Extremity/Trunk Assessment Upper Extremity Assessment Upper Extremity Assessment: Generalized weakness   Lower Extremity Assessment Lower Extremity Assessment: Generalized weakness       Communication  Communication Communication: No apparent difficulties   Cognition Arousal: Alert Behavior During Therapy: WFL for tasks assessed/performed Cognition: No apparent impairments                               Following commands: Intact       Cueing  General Comments   Cueing Techniques: Verbal cues              Home Living Family/patient expects to be discharged to:: Skilled nursing facility Living Arrangements: Alone Available Help at Discharge: Friend(s);Available PRN/intermittently Type of Home: House Home Access: Stairs to enter [employer] of Steps: 5 Entrance Stairs-Rails: Can reach both;Left;Right Home Layout: Two level;Able to live on main level with bedroom/bathroom     Bathroom Shower/Tub: Producer, television/film/video: Handicapped height Bathroom Accessibility: No   Home Equipment: Toilet riser;Shower Counsellor (2 wheels);Rollator (4 wheels);Cane - single point;Hand held shower head   Additional Comments: has an aide come in x1/wk to help her shower, currently has [facility] PT/OT come x2/wk      Prior Functioning/Environment Prior Level of Function : Independent/Modified Independent             Mobility Comments: friends assist with driving for errands/groceries lately. ambulatory with RW especially the last couple of weeks; uses [facility] to get to her car for appts and then transport/WC for appts ADLs Comments: Pt reports being Ind at baseline but recently using RW.  Medications get delivered and family assists with appts and groceries.    OT Problem List: Decreased strength;Impaired balance (sitting and/or standing);Decreased safety awareness;Decreased activity tolerance;Decreased knowledge of use of DME or AE   OT Treatment/Interventions: Self-care/ADL training;Therapeutic activities;Balance training;Therapeutic exercise;Energy conservation;Patient/family education      OT Goals(Current goals can be found in the care plan  section)   Acute Rehab OT Goals Patient Stated Goal: to get stronger and go to rehab OT Goal Formulation: With patient Time For Goal Achievement: 01/26/24 Potential to Achieve Goals: Fair ADL Goals Pt Will Perform Grooming: with modified independence;standing Pt Will Perform Lower Body Dressing: with modified independence;sit to/from stand Pt Will Transfer to Toilet: with modified independence;ambulating Pt Will Perform Toileting - Clothing Manipulation and hygiene: with modified independence;sit to/from stand   OT Frequency:  Min 2X/week       AM-PAC OT 6 Clicks Daily Activity     Outcome Measure Help from another person eating meals?: None Help from another person taking care of personal grooming?: A Little Help from another person toileting, which includes using toliet, bedpan, or urinal?: A Little Help from another person bathing (including washing, rinsing, drying)?: A Little Help from another person to put on and taking off regular upper body clothing?: None Help from another person to put on and taking off regular lower body clothing?: A Little 6 Click Score: 20   End of Session Equipment Utilized During Treatment: Rolling walker (2 wheels)  Activity Tolerance: Patient tolerated treatment well Patient left: in bed;with call bell/phone within reach;with bed alarm set  OT Visit Diagnosis: Unsteadiness on feet (R26.81)  Time: 8556-8495 OT Time Calculation (min): 21 min Charges:  OT General Charges $OT Visit: 1 Visit OT Evaluation $OT Eval Low Complexity: 1 Low OT Treatments $Self Care/Home Management : 8-22 mins  Izetta Claude, MS, OTR/L , CBIS ascom [phone]  01/12/24, 3:20 PM

## 2024-01-13 LAB — URINE CULTURE

## 2024-01-13 NOTE — ED Notes (Signed)
 Pt called out to use the BR. Pt was walking to the toilet with walker. Pt was told to pull call light when done. Pt had a BM. Pt back in bed at this time.

## 2024-01-13 NOTE — ED Provider Notes (Signed)
 Emergency Medicine Observation Re-evaluation Note   BP 122/66   Pulse 75   Temp 98 F (36.7 C) (Oral)   Resp 17   Ht 5' 5 (1.651 m)   Wt 63.5 kg   SpO2 100%   BMI 23.30 kg/m    ED Course / MDM   No reported events during my shift at the time of this note.   Pt is awaiting dispo from consultants   Ginnie Shams MD    Shams Ginnie, MD 01/13/24 [phone]

## 2024-01-13 NOTE — ED Notes (Signed)
 Patient ambulatory to toilet w/ walker.

## 2024-01-13 NOTE — ED Notes (Signed)
 Ambulated pt to the bathroom at this time. Gait remains steady with walker.

## 2024-01-13 NOTE — ED Notes (Signed)
 Introduced self to pt, assisted pt to the toilet and back to bed, and got pt a bedside table to use. Pt resting with no needs at this time.

## 2024-01-13 NOTE — ED Notes (Signed)
 Pt called out to use the restroom. Pt voided and was assisted back to bed. No other needs at this time.

## 2024-01-13 NOTE — ED Notes (Signed)
 Pt ambulated to the bathroom at this time with assistance from walker. Gait steady. Pt able to pull pants up and down without assistance from this RN. Stand-by assist provided.

## 2024-01-14 NOTE — ED Notes (Signed)
 Assumed care of pt. Report received from previous RN. Pt alert and oriented. Pt RR even and unlabored. Pt denies pain at this time. Pt calm and cooperative. Pt denies any needs at this time.

## 2024-01-14 NOTE — ED Notes (Signed)
 Fall risk bundle in place due to pt being a high fall risk.

## 2024-01-14 NOTE — ED Notes (Signed)
 Pt ambulatory to restroom with walker at this time. Pt w/ steady and even gait. Pt back in bed and denies no other needs at this time.

## 2024-01-14 NOTE — TOC CM/SW Note (Signed)
..  Transition of Care [facility]) - Inpatient Brief Assessment   Patient Details  Name: Jenna Carlson MRN: [id number] Date of Birth: 01/15/1951  Transition of Care [facility]) CM/SW Contact:    Edsel [name], LCSW Phone Number: 01/14/2024, 7:55 PM   Clinical Narrative:  SW submitted FL2 to SNFs for rehab placement.  Waiting on acceptance  Transition of Care Asessment:

## 2024-01-15 NOTE — ED Provider Notes (Signed)
-----------------------------------------   8:22 AM on 01/15/2024 -----------------------------------------   Blood pressure (!) 137/94, pulse 94, temperature 98.1 F (36.7 C), temperature source Oral, resp. rate 16, height 1.651 m (5' 5), weight 63.5 kg, SpO2 100%.  The patient is calm and cooperative at this time.  There have been no acute events since the last update.  Awaiting disposition plan from [facility] team.   Gordan Huxley, MD 01/15/24 [phone]

## 2024-01-15 NOTE — Progress Notes (Signed)
 Occupational Therapy Treatment Patient Details Name: Jenna Carlson MRN: [id number] DOB: 18-Apr-1951 Today's Date: 01/15/2024   History of present illness Nicol Aikens is a 73yoF who [facility] ~3 hours post DC from here after acute episode of weakness after exiting the car and walking over to her entry steps in garage. Pt reports first reponders getting BP 60s/40s. Pt previously admitted 7/16-7/25 for intracible N/V and underwent esophageal dilation with Dr. Jinny. Pt reports difficulty with strength, AMB, and activity intolerance over past few motnhs. While admitted her pt was progressively able to improve walking and transfers to just below houseold AMB threshold, but recommendations remained for STR due to multiple evidence based screening measures that indicated safety concerns with direct return to home. Unfortunately insurance denied STR/SNF placement and arrangements were made for return to home.   OT comments  Pt seen for OT tx. Pt agreeable, denies complaints aside from weakness. Pt required CGA for ADL transfers with RW and for in-room ambulation to/from the [facility] over the toilet. SBA for clothing mgt in standing and mod indep with pericare using lateral lean. Afterwards, pt returned to bed and endorsed 8/10 RPE. Pt educated in RPE scale to support activity as well as instructed in rest breaks and activity pacing to maximize safety. Pt verbalized understanding. Continues to benefit from skilled OT services.       If plan is discharge home, recommend the following:  A little help with walking and/or transfers;A little help with bathing/dressing/bathroom;Assistance with cooking/housework;Assist for transportation;Help with stairs or ramp for entrance   Equipment Recommendations  None recommended by OT    Recommendations for Other Services      Precautions / Restrictions Precautions Precautions: Fall Recall of Precautions/Restrictions: Intact Restrictions Weight Bearing  Restrictions Per Provider Order: No       Mobility Bed Mobility Overal bed mobility: Needs Assistance Bed Mobility: Supine to Sit, Sit to Supine     Supine to sit: Supervision Sit to supine: Supervision        Transfers Overall transfer level: Needs assistance Equipment used: Rolling walker (2 wheels) Transfers: Sit to/from Stand Sit to Stand: Contact guard assist, From elevated surface           General transfer comment: ED gurney elevation     Balance Overall balance assessment: Needs assistance Sitting-balance support: Feet supported Sitting balance-Leahy Scale: Good     Standing balance support: No upper extremity supported, During functional activity Standing balance-Leahy Scale: Fair Standing balance comment: able to complete clothing mgt with B hands in standing without LOB                           ADL either performed or assessed with clinical judgement   ADL Overall ADL's : Needs assistance/impaired     Grooming: Wash/dry hands;Standing;Supervision/safety                   Toilet Transfer: Contact guard assist;Ambulation;Comfort height toilet;Rolling walker (2 wheels)   Toileting- Clothing Manipulation and Hygiene: Contact guard assist;Sit to/from stand              [employer]     Cognition Arousal: Alert Behavior During Therapy: WFL for tasks assessed/performed Cognition: No apparent impairments  Following commands: Intact        Cueing   Cueing Techniques: Verbal cues  Exercises Other Exercises Other Exercises: Pt educated in RPE scale to support activity as well as instructed in rest breaks and activity pacing to maximize safety. Pt endorsing 8/10 RPE after ambulating in room distances with RW to/from bathroom.    Shoulder Instructions       General Comments       Pertinent Vitals/ Pain       Pain Assessment Pain Assessment: No/denies pain  Home Living                                          Prior Functioning/Environment              Frequency  Min 2X/week        Progress Toward Goals  OT Goals(current goals can now be found in the care plan section)     Acute Rehab OT Goals Patient Stated Goal: get stronger at rehab then go home OT Goal Formulation: With patient Time For Goal Achievement: 01/26/24 Potential to Achieve Goals: Good  Plan      Co-evaluation                 AM-PAC OT 6 Clicks Daily Activity     Outcome Measure   Help from another person eating meals?: None Help from another person taking care of personal grooming?: None Help from another person toileting, which includes using toliet, bedpan, or urinal?: A Little Help from another person bathing (including washing, rinsing, drying)?: A Little Help from another person to put on and taking off regular upper body clothing?: None Help from another person to put on and taking off regular lower body clothing?: A Little 6 Click Score: 21    End of Session Equipment Utilized During Treatment: Rolling walker (2 wheels)  OT Visit Diagnosis: Unsteadiness on feet (R26.81)   Activity Tolerance Patient tolerated treatment well   Patient Left in bed;with call bell/phone within reach   Nurse Communication          Time: 8586-8565 OT Time Calculation (min): 21 min  Charges: OT General Charges $OT Visit: 1 Visit OT Treatments $Self Care/Home Management : 8-22 mins  Warren SAUNDERS., MPH, MS, OTR/L ascom [phone] 01/15/24, 2:43 PM

## 2024-01-15 NOTE — TOC CM/SW Note (Signed)
..  Transition of Care [facility]) - Inpatient Brief Assessment   Patient Details  Name: Jenna Carlson MRN: [id number] Date of Birth: 01-Feb-1951  Transition of Care [facility]) CM/SW Contact:    Jenna [name], LCSW Phone Number: 01/15/2024, 10:24 AM   Clinical Narrative:  Pt was approved for [facility] for placement.  Staff to submit to insurance for authorization   Transition of Care Asessment:

## 2024-01-15 NOTE — Progress Notes (Signed)
 VSS, Low BP appears resolved. Pt received on stretcher in ED, agreeable to PT session. CGA with cues for safety for bed mobility and transfers, short distance gait with RW for balance and CGA. Pt fatigues quickly due to low endurance and LE weakness L>R. Pt is independent at baseline and will benefit from STR prior to returning home.   01/15/24 1600  PT Visit Information  Assistance Needed +1  History of Present Illness Jenna Carlson is a 73yoF who comes to [facility] on 01/11/24 ~3 hours post DC from here after acute episode of weakness after exiting the car and walking over to her entry steps in garage. Pt reports first reponders getting BP 60s/40s. Pt previously admitted 7/16-7/25 for intracible N/V and underwent esophageal dilation with Dr. Jinny. Pt reports difficulty with strength, AMB, and activity intolerance over past few motnhs. While admitted her pt was progressively able to improve walking and transfers to just below houseold AMB threshold, but recommendations remained for STR due to multiple evidence based screening measures that indicated safety concerns with direct return to home. Unfortunately insurance denied STR/SNF placement and arrangements were made for return to home.  Subjective Data  Subjective I need to go to rehab  Patient Stated Goal improve mobility and strength for independent mobility in the home.  Precautions  Precautions Fall  Recall of Precautions/Restrictions Intact  Restrictions  Weight Bearing Restrictions Per Provider Order No  Pain Assessment  Pain Assessment No/denies pain  Cognition  Arousal Alert  Behavior During Therapy WFL for tasks assessed/performed  PT - Cognitive impairments No apparent impairments  Following Commands  Following commands Intact  Cueing  Cueing Techniques Verbal cues  Communication  Communication No apparent difficulties  Bed Mobility  Overal bed mobility Needs Assistance  Bed Mobility Supine to Sit;Sit to Supine  Supine to sit  Supervision  General bed mobility comments labored but largely successful with gross motor aspects  Transfers  Overall transfer level Needs assistance  Equipment used Rolling walker (2 wheels)  Transfers Sit to/from Stand  Sit to Stand Contact guard assist;From elevated surface  General transfer comment Pt struggles from lower surfaces  Ambulation/Gait  Ambulation/Gait assistance Contact guard assist  Gait Distance (Feet) 85 Feet  Assistive device Rolling walker (2 wheels)  Gait Pattern/deviations Step-through pattern;Decreased step length - right;Decreased step length - left;Decreased stride length;Narrow base of support  General Gait Details slow and weak appearing, but consistent pacing  Gait velocity decr  Balance  Overall balance assessment Needs assistance  Sitting-balance support Feet supported  Sitting balance-Leahy Scale Good  Sitting balance - Comments Steady sitting balance while EOB  Standing balance support Bilateral upper extremity supported;During functional activity;Reliant on assistive device for balance  Standing balance-Leahy Scale Fair  Standing balance comment Short distance gait training with reliance on RW for balance.  Exercises  Exercises General Lower Extremity  General Exercises - Lower Extremity  Ankle Circles/Pumps AROM;Both;15 reps;Seated  Long Arc Quad AROM;Both;15 reps;Seated  Hip Flexion/Marching AROM;Both;15 reps;Seated  Other Exercises  Other Exercises Pt educated on role of PT and benefits of STR at d/c  Other Exercises Cues given for safe transfers and mobility  PT - End of Session  Equipment Utilized During Treatment Gait belt  Activity Tolerance Patient tolerated treatment well  Patient left in chair;with call bell/phone within reach  Nurse Communication Mobility status   PT - Assessment/Plan  PT Visit Diagnosis Other abnormalities of gait and mobility (R26.89);Muscle weakness (generalized) (M62.81);Difficulty in walking, not elsewhere  classified (R26.2)  PT Frequency (ACUTE ONLY) Min 2X/week  Follow Up Recommendations Skilled nursing-short term rehab (<3 hours/day)  Can patient physically be transported by private vehicle Yes  Patient can return home with the following A little help with walking and/or transfers;A little help with bathing/dressing/bathroom;Assist for transportation;Help with stairs or ramp for entrance  PT equipment Other (comment) (TBD at next level of care)  AM-PAC PT 6 Clicks Mobility Outcome Measure (Version 2)  Help needed turning from your back to your side while in a flat bed without using bedrails? 3  Help needed moving from lying on your back to sitting on the side of a flat bed without using bedrails? 3  Help needed moving to and from a bed to a chair (including a wheelchair)? 2  Help needed standing up from a chair using your arms (e.g., wheelchair or bedside chair)? 2  Help needed to walk in hospital room? 3  Help needed climbing 3-5 steps with a railing?  2  6 Click Score 15  Consider Recommendation of Discharge To: CIR/SNF/LTACH  Progressive Mobility  What is the highest level of mobility based on the mobility assessment? Level 4 (Ambulates with assistance) - Balance while stepping forward/back - Complete  Activity Ambulated with assistance  PT Goal Progression  Progress towards PT goals Progressing toward goals  PT Time Calculation  PT Start Time (ACUTE ONLY) 1552  PT Stop Time (ACUTE ONLY) 1620  PT Time Calculation (min) (ACUTE ONLY) 28 min  PT General Charges  $$ ACUTE PT VISIT 1 Visit  PT Treatments  $Gait Training 8-22 mins  $Therapeutic Exercise 8-22 mins  Darice Bohr, PTA 01/15/2024

## 2024-01-16 NOTE — ED Notes (Signed)
 Pt assisted to bedside toliet by EDT at this time.

## 2024-01-16 NOTE — ED Provider Notes (Signed)
-----------------------------------------   5:55 AM on 01/16/2024 -----------------------------------------   Blood pressure 128/74, pulse 87, temperature 97.8 F (36.6 C), temperature source Oral, resp. rate 18, height 1.651 m (5' 5), weight 63.5 kg, SpO2 98%.  The patient is calm and cooperative at this time.  There have been no acute events since the last update.  Awaiting disposition plan from [facility] team.   Gordan Huxley, MD 01/16/24 [phone]

## 2024-01-16 NOTE — ED Notes (Signed)
 Pt sat up on side of bed for dinner.

## 2024-01-16 NOTE — TOC Progression Note (Addendum)
 Transition of Care [facility]) - Progression Note    Patient Details  Name: Jenna Carlson MRN: [id number] Date of Birth: [birth year]  [facility]) CM/SW Contact  Lauraine JAYSON Carpen, LCSW Phone Number: 01/16/2024, 11:03 AM  Clinical Narrative:   [facility] will not have a bed until Friday. Left message for [facility] admissions coordinator to see how soon they would have a bed if auth approved.  11:52 am: [facility] can take patient today if auth approved. Patient is aware and agreeable. CSW started serbia.  3:03 pm: Auth still pending.    Barriers to Discharge: Continued Medical Work up               Expected Discharge Plan and Services In-house Referral: Clinical Social Work     Living arrangements for the past 2 months: Single Family Home                                       Social Drivers of Health (SDOH) Interventions SDOH Screenings   Food Insecurity: No Food Insecurity (01/03/2024)  Housing: Low Risk  (01/03/2024)  Transportation Needs: No Transportation Needs (01/03/2024)  Utilities: Not At Risk (01/03/2024)  Financial Resource Strain: Low Risk  (12/18/2023)   Received from [facility]  Social Connections: Moderately Isolated (01/03/2024)  Tobacco Use: Low Risk  (01/11/2024)  Recent Concern: Tobacco Use - Medium Risk (12/18/2023)   Received from [facility]    Readmission Risk Interventions     No data to display

## 2024-01-17 NOTE — ED Provider Notes (Signed)
 12:31 PM: Social work to chat with peer to peer since insurance was offering it for SNF authorization request, discussed with Dr. Laurier who states that since patient is able to ambulate in and has shown improvement in transfers and ADL mobility.  Discussed with social work and insurance will give her the appeal information.    Waymond Lorelle Cummins, MD 01/17/24 [phone]

## 2024-01-17 NOTE — TOC Progression Note (Signed)
 Transition of Care [facility]) - Progression Note    Patient Details  Name: TKEYA STENCIL MRN: [id number] Date of Birth: 09/28/1950  Transition of Care [facility]) CM/SW Contact  Lauraine JAYSON Carpen, LCSW Phone Number: 01/17/2024, 12:19 PM  Clinical Narrative:   Insurance is offering a Audiological scientist review for [employer]. Sent details to MD. Pricilla is today at 4:30.    Barriers to Discharge: Continued Medical Work up               Expected Discharge Plan and Services In-house Referral: Clinical Social Work     Living arrangements for the past 2 months: Single Family Home                                       Social Drivers of Health (SDOH) Interventions SDOH Screenings   Food Insecurity: No Food Insecurity (01/03/2024)  Housing: Low Risk  (01/03/2024)  Transportation Needs: No Transportation Needs (01/03/2024)  Utilities: Not At Risk (01/03/2024)  Financial Resource Strain: Low Risk  (12/18/2023)   Received from [facility]  Social Connections: Moderately Isolated (01/03/2024)  Tobacco Use: Low Risk  (01/11/2024)  Recent Concern: Tobacco Use - Medium Risk (12/18/2023)   Received from [facility]    Readmission Risk Interventions     No data to display

## 2024-01-17 NOTE — ED Provider Notes (Signed)
   [facility] Observation Note   ----------------------------------------- 10:05 PM on 01/17/2024 -----------------------------------------  Jenna Carlson is a 73 y.o. female currently boarding in the Emergency Department.  No acute events since last update.  Recent Vitals   Most recent vital signs: Vitals:   01/17/24 0923 01/17/24 2107  BP: (!) 146/78 (!) 142/83  Pulse: 96 86  Resp: 17 18  Temp: 97.7 F (36.5 C) 98 F (36.7 C)  SpO2: 95% 95%    ED Results / Procedures / Treatments   Labs (all labs ordered are listed, but only abnormal results are displayed) Labs Reviewed  URINE CULTURE - Abnormal; Notable for the following components:      Result Value   Culture MULTIPLE SPECIES PRESENT, SUGGEST RECOLLECTION (*)    All other components within normal limits  COMPREHENSIVE METABOLIC PANEL WITH GFR - Abnormal; Notable for the following components:   Glucose, Bld 103 (*)    Creatinine, Ser 1.08 (*)    Total Protein 6.1 (*)    Albumin 2.6 (*)    GFR, Estimated 54 (*)    All other components within normal limits  CBC - Abnormal; Notable for the following components:   RBC 3.37 (*)    Hemoglobin 11.1 (*)    HCT 35.3 (*)    MCV 104.7 (*)    All other components within normal limits  URINALYSIS, ROUTINE W REFLEX MICROSCOPIC - Abnormal; Notable for the following components:   Color, Urine YELLOW (*)    APPearance CLOUDY (*)    Hgb urine dipstick SMALL (*)    Protein, ur 30 (*)    Leukocytes,Ua LARGE (*)    Bacteria, UA RARE (*)    Non Squamous Epithelial PRESENT (*)    All other components within normal limits  CBG MONITORING, ED    MEDICATIONS ORDERED IN ED: Medications  ARIPiprazole  (ABILIFY ) tablet 5 mg (5 mg Oral Given 01/17/24 0927)  azelastine  (ASTELIN ) 0.1 % nasal spray 1 spray (1 spray Each Nare Given 01/17/24 2105)  clopidogrel  (PLAVIX ) tablet 75 mg (75 mg Oral Given 01/17/24 0927)  fluticasone  furoate-vilanterol (BREO ELLIPTA )  100-25 MCG/ACT 1 puff (1 puff Inhalation Given 01/17/24 0930)  montelukast  (SINGULAIR ) tablet 10 mg (10 mg Oral Given 01/17/24 2105)  pantoprazole  (PROTONIX ) EC tablet 40 mg (40 mg Oral Given 01/17/24 0927)  ondansetron  (ZOFRAN ) tablet 4 mg (has no administration in time range)  potassium chloride  20 MEQ/15ML (10%) solution 20 mEq (20 mEq Oral Given 01/17/24 0927)  QUEtiapine  (SEROQUEL ) tablet 25 mg (25 mg Oral Given 01/17/24 2104)  rosuvastatin  (CRESTOR ) tablet 20 mg (20 mg Oral Given 01/17/24 0927)  albuterol  (PROVENTIL ) (2.5 MG/3ML) 0.083% nebulizer solution 2.5 mg (has no administration in time range)  cephALEXin  (KEFLEX ) capsule 500 mg (500 mg Oral Given 01/17/24 1802)  sodium chloride  0.9 % bolus 1,000 mL (0 mLs Intravenous Stopped 01/12/24 0101)     ED Plan   Currently awaiting placement into an appropriate living facility.  Social work is working with the patient to help achieve this.      Dorothyann Drivers, MD 01/17/24 2205

## 2024-01-17 NOTE — ED Notes (Addendum)
 LCSW came by Pt room and gave Pt information about a re-appeal. Pt stated she needed help b/c the # she called that was given to her by LCSW told her she needed to re-appeal online. This RN has no info or how to re-appeal. Clance Carpen LCSW notified.

## 2024-01-17 NOTE — TOC CM/SW Note (Signed)
..  Transition of Care [facility]) - Inpatient Brief Assessment   Patient Details  Name: Jenna Carlson MRN: [id number] Date of Birth: [birth year]  Transition of Care [facility]) CM/SW Contact:    Edsel [name], LCSW Phone Number: 01/17/2024, 2:13 PM   Clinical Narrative:  SW received message that pt was denied rehab placement.  SW spoke with pt regarding appeal.  SW provided pt appeal number [phone]).  SW reviewed and completed Appointment of Representative form with pt and both signatures acquired.  Pt stated she will call to appeal.  SW faxed form to [facility] [phone]  Transition of Care Asessment:

## 2024-01-17 NOTE — Progress Notes (Signed)
 Occupational Therapy Treatment Patient Details Name: Jenna Carlson MRN: [id number] DOB: June 15, 1951 Today's Date: 01/17/2024   History of present illness Jenna Carlson is a 73yoF who comes to [facility] on 01/11/24 ~3 hours post DC from here after acute episode of weakness after exiting the car and walking over to her entry steps in garage. Pt reports first reponders getting BP 60s/40s. Pt previously admitted 7/16-7/25 for intracible N/V and underwent esophageal dilation with Dr. Jinny. Pt reports difficulty with strength, AMB, and activity intolerance over past few motnhs. While admitted her pt was progressively able to improve walking and transfers to just below houseold AMB threshold, but recommendations remained for STR due to multiple evidence based screening measures that indicated safety concerns with direct return to home. Unfortunately insurance denied STR/SNF placement and arrangements were made for return to home.   OT comments  Pt seen for OT tx. Pt demonstrates improvement in transfers and ADL mobility requiring Supv-SBA with RW as compared to previous session. Pt completed toileting in room with SBA, sat for grooming EOB as part of a seated rest break. Pt ambulated into the hall ~25' with RW and chair follow before endorsing need for seated rest break. Pt endorsing 8/10 RPE. Pt then able to ambulate ~15' with RW with brief standing rest break and then requests return to sitting, citing 10/10 RPE. Pt required additional instruction provided in ECS and activity pacing. Goal for mobility was to stop around 6-7/10, however, with dual tasking pt demo's difficulty managing RPE. HR  up to 120's with exertion. Pt continues to benefit to maximize safe eventual return home, minimize falls, over exertion, and readmissions.       If plan is discharge home, recommend the following:  A little help with walking and/or transfers;A little help with bathing/dressing/bathroom;Assistance with  cooking/housework;Assist for transportation;Help with stairs or ramp for entrance   Equipment Recommendations  None recommended by OT    Recommendations for Other Services      Precautions / Restrictions Precautions Precautions: Fall Recall of Precautions/Restrictions: Intact Restrictions Weight Bearing Restrictions Per Provider Order: No       Mobility Bed Mobility Overal bed mobility: Needs Assistance Bed Mobility: Supine to Sit     Supine to sit: Supervision          Transfers Overall transfer level: Needs assistance Equipment used: Rolling walker (2 wheels) Transfers: Sit to/from Stand Sit to Stand: Supervision                 Balance Overall balance assessment: Needs assistance Sitting-balance support: Feet supported Sitting balance-Leahy Scale: Good     Standing balance support: Bilateral upper extremity supported, During functional activity, Reliant on assistive device for balance Standing balance-Leahy Scale: Fair                             ADL either performed or assessed with clinical judgement   ADL Overall ADL's : Needs assistance/impaired     Grooming: Sitting;Modified independent                   Toilet Transfer: Ambulation;Comfort height toilet;Rolling walker (2 wheels);Supervision/safety Toilet Transfer Details (indicate cue type and reason): BSC over toilet Toileting- Clothing Manipulation and Hygiene: Supervision/safety;Sit to/from stand       Functional mobility during ADLs: Supervision/safety;Contact guard assist;Rolling walker (2 wheels);Cueing for safety General ADL Comments: SBA, VC for activity pacing/rest breaks to minimize over-exertion    Extremity/Trunk Assessment  Vision       Perception     Praxis     Communication     Cognition Arousal: Alert Behavior During Therapy: WFL for tasks assessed/performed Cognition: No apparent impairments                                         Cueing      Exercises Other Exercises Other Exercises: Pt ambulating in hall with seated and standing rest breaks as needed, additional instruction provided in ECS and activity pacing with dual tasking pt demo's difficulty wiht    Shoulder Instructions       General Comments HR up to 120's with exertion, SpO2 >97% on RA    Pertinent Vitals/ Pain       Pain Assessment Pain Assessment: No/denies pain  Home Living                                          Prior Functioning/Environment              Frequency  Min 2X/week        Progress Toward Goals  OT Goals(current goals can now be found in the care plan section)  Progress towards OT goals: Progressing toward goals  Acute Rehab OT Goals Patient Stated Goal: get stronger at rehab then go home OT Goal Formulation: With patient Time For Goal Achievement: 01/26/24 Potential to Achieve Goals: Good  Plan      Co-evaluation                 AM-PAC OT 6 Clicks Daily Activity     Outcome Measure   Help from another person eating meals?: None Help from another person taking care of personal grooming?: None Help from another person toileting, which includes using toliet, bedpan, or urinal?: A Little Help from another person bathing (including washing, rinsing, drying)?: A Little Help from another person to put on and taking off regular upper body clothing?: None Help from another person to put on and taking off regular lower body clothing?: A Little 6 Click Score: 21    End of Session Equipment Utilized During Treatment: Rolling walker (2 wheels)  OT Visit Diagnosis: Unsteadiness on feet (R26.81)   Activity Tolerance Patient tolerated treatment well   Patient Left in chair;with call bell/phone within reach   Nurse Communication          Time: 9045-8984 OT Time Calculation (min): 21 min  Charges: OT General Charges $OT Visit: 1 Visit OT  Treatments $Therapeutic Activity: 8-22 mins  Warren SAUNDERS., MPH, MS, OTR/L ascom [phone] 01/17/24, 10:47 AM

## 2024-01-17 NOTE — ED Notes (Signed)
 Pt given update from LCSW, Pt grateful.

## 2024-01-17 NOTE — ED Notes (Signed)
 Pt up to toilet and back to bed after voiding. Pt has call bell within reach and no other needs at this time.

## 2024-01-17 NOTE — ED Provider Notes (Signed)
-----------------------------------------   7:00 AM on 01/17/2024 -----------------------------------------   Blood pressure 120/72, pulse 86, temperature 97.6 F (36.4 C), resp. rate 16, height 5' 5 (1.651 m), weight 63.5 kg, SpO2 95%.  The patient is calm and cooperative at this time.  There have been no acute events since the last update.  Awaiting disposition plan from case management/social work.    Nevyn Bossman, Josette SAILOR, DO 01/17/24 0700

## 2024-01-17 NOTE — ED Notes (Signed)
 Fall precautions in place for Pt. This RN placed fall band, fall grip socks, bed alarm and fall sign.

## 2024-01-17 NOTE — TOC CM/SW Note (Signed)
..  Transition of Care [facility]) - Inpatient Brief Assessment   Patient Details  Name: Jenna Carlson MRN: [id number] Date of Birth: May 29, 1951  [facility] Care [facility]) CM/SW Contact:    Edsel [name], LCSW Phone Number: 01/17/2024, 3:06 PM   Clinical Narrative: SW contacted [employer] for appeal.  Information given.  48 to 78 hours for status update.    Transition of Care Asessment:

## 2024-01-17 NOTE — ED Notes (Signed)
 Pt consumed 100% of breakfast tray, trash discarded appropriately.

## 2024-01-17 NOTE — ED Notes (Signed)
 TOC

## 2024-01-18 MED ORDER — CEPHALEXIN 500 MG PO CAPS: 500.0000 mg | ORAL_CAPSULE | Freq: Two times a day (BID) | ORAL | 0 refills | Status: AC

## 2024-01-18 NOTE — ED Provider Notes (Signed)
 Emergency Medicine Observation Re-evaluation Note  Jenna Carlson is a 73 y.o. female, seen on rounds today.  Pt initially presented to the ED for complaints of Weakness  Currently, the patient is is no acute distress. Denies any concerns at this time.  Physical Exam  Blood pressure (!) 142/83, pulse 86, temperature 98 F (36.7 C), temperature source Oral, resp. rate 18, height 5' 5 (1.651 m), weight 63.5 kg, SpO2 95%.  Physical Exam: General: No apparent distress Pulm: Normal WOB Neuro: Moving all extremities Psych: Resting comfortably     ED Course / MDM     I have reviewed the labs performed to date as well as medications administered while in observation.  Recent changes in the last 24 hours include: No acute events overnight.  Plan   Current plan: Patient awaiting placement to appropriate living facility.  Social work is following.    Suzanne Kirsch, MD 01/18/24 [phone]

## 2024-01-18 NOTE — ED Notes (Signed)
Pt assisted back into bed.

## 2024-01-18 NOTE — ED Notes (Signed)
 RN attempted to call report to [facility]. No answer at this time.

## 2024-01-18 NOTE — TOC CM/SW Note (Signed)
..  Transition of Care [facility]) - Inpatient Brief Assessment   Patient Details  Name: Jenna Carlson MRN: [id number] Date of Birth: 1950/10/31  Transition of Care [facility]) CM/SW Contact:    Edsel [name], LCSW Phone Number: 01/18/2024, 10:47 AM   Clinical Narrative: SW received email from [facility] regarding pt appeal requesting additional info. SW faxed additional medical records   Transition of Care Asessment:

## 2024-01-18 NOTE — Progress Notes (Signed)
 Occupational Therapy Treatment Patient Details Name: Jenna Carlson MRN: [id number] DOB: [birth year] Today's Date: 01/18/2024   History of present illness Jenna Carlson is a 73yoF who comes to [facility] on 01/11/24 ~3 hours post DC from here after acute episode of weakness after exiting the car and walking over to her entry steps in garage. Pt reports first reponders getting BP 60s/40s. Pt previously admitted 7/16-7/25 for intracible N/V and underwent esophageal dilation with Dr. Jinny. Pt reports difficulty with strength, AMB, and activity intolerance over past few motnhs. While admitted her pt was progressively able to improve walking and transfers to just below houseold AMB threshold, but recommendations remained for STR due to multiple evidence based screening measures that indicated safety concerns with direct return to home. Unfortunately insurance denied STR/SNF placement and arrangements were made for return to home.   OT comments  Pt seen for OT tx this date. Pt presents with decreased functional activity tolerance, generalized weakness and impaired balance. Pt completed the 30 second Chair Stand assessment from the [employer] STEADI - Older Adult Fall Prevention program, scoring 4 STS with BUE which is below the average for her age indicating a risk for falls. Barthel Activities of Daily Living Index completed, pt scoring a 55 indicating pt is partially dependent for basic activities of daily living performance.   Pt is able to complete functional transfers with CGA-supervision, and is only able to ambulate to the door and back using RW before requiring 4 mins of seated rest to recover before ambulating ~30 ft with 2 standing rest breaks. Pt demonstrates toileting transfers and clothing mgmt + hygiene with CGA for safety. Pt endorses 7/10 RPE end of session. Discharge recommendation appropriate, OT will continue to follow. Pt would benefit from STR to decrease caregiver burden, reduce falls and reduce hospital  re-admissions.         If plan is discharge home, recommend the following:  A little help with walking and/or transfers;A little help with bathing/dressing/bathroom;Assistance with cooking/housework;Assist for transportation;Help with stairs or ramp for entrance   Equipment Recommendations  None recommended by OT       Precautions / Restrictions Precautions Precautions: Fall Recall of Precautions/Restrictions: Intact Restrictions Weight Bearing Restrictions Per Provider Order: No       Mobility Bed Mobility Overal bed mobility: Needs Assistance             General bed mobility comments: NT, pt recieved and left in recliner    Transfers Overall transfer level: Needs assistance Equipment used: Rolling walker (2 wheels) Transfers: Sit to/from Stand Sit to Stand: Supervision           General transfer comment: movements effortful, pt requires frequent recovery breaks due to decreased activity tolerance     Balance Overall balance assessment: Needs assistance Sitting-balance support: Feet supported Sitting balance-Leahy Scale: Good     Standing balance support: Bilateral upper extremity supported, During functional activity, Reliant on assistive device for balance Standing balance-Leahy Scale: Fair Standing balance comment: fatigues quickly                           ADL either performed or assessed with clinical judgement   ADL Overall ADL's : Needs assistance/impaired     Grooming: Standing;Wash/dry hands;Contact guard assist                   Toilet Transfer: Ambulation;Comfort height toilet;Rolling walker (2 wheels);Contact guard assist Toilet Transfer Details (indicate cue type  and reason): BSC over toilet Toileting- Clothing Manipulation and Hygiene: Supervision/safety;Sit to/from stand       Functional mobility during ADLs: Contact guard assist;Rolling walker (2 wheels);Cueing for sequencing;Cueing for safety General ADL  Comments: mobility from recliner using RW to door in room ~15 ft before pt requires extended recovery break ~4 mins with HR increasing to 110. After 4 mins rest, pt able to mobilize ~30 ft with 2 standing recovery breaks.     Communication Communication Communication: No apparent difficulties   Cognition Arousal: Alert Behavior During Therapy: WFL for tasks assessed/performed Cognition: No apparent impairments                               Following commands: Intact        Cueing   Cueing Techniques: Verbal cues  Exercises Exercises: Other exercises Other Exercises Other Exercises: Pt completes 30 sec stand test, scoring well under the average for her age range (pt able to complete 4 STS with use of hands), avg score is 10 indicating risk of falls Other Exercises: Barthel Index for ADLs given, pt scoring a 55 which indicates pt is partially dependent. Lower scores in this assessment  indicate increasing disability and a greater need for skilled care.            Pertinent Vitals/ Pain       Pain Assessment Pain Assessment: No/denies pain         Frequency  Min 2X/week        Progress Toward Goals  OT Goals(current goals can now be found in the care plan section)  Progress towards OT goals: Progressing toward goals  Acute Rehab OT Goals OT Goal Formulation: With patient Time For Goal Achievement: 01/26/24 Potential to Achieve Goals: Good ADL Goals Pt Will Perform Grooming: with modified independence;standing Pt Will Perform Lower Body Dressing: with modified independence;sit to/from stand Pt Will Transfer to Toilet: with modified independence;ambulating Pt Will Perform Toileting - Clothing Manipulation and hygiene: with modified independence;sit to/from stand  Plan         AM-PAC OT 6 Clicks Daily Activity     Outcome Measure   Help from another person eating meals?: None Help from another person taking care of personal grooming?: None Help  from another person toileting, which includes using toliet, bedpan, or urinal?: A Little Help from another person bathing (including washing, rinsing, drying)?: A Little Help from another person to put on and taking off regular upper body clothing?: None Help from another person to put on and taking off regular lower body clothing?: A Little 6 Click Score: 21    End of Session Equipment Utilized During Treatment: Gait belt;Rolling walker (2 wheels)  OT Visit Diagnosis: Unsteadiness on feet (R26.81)   Activity Tolerance Patient tolerated treatment well   Patient Left in chair;with call bell/phone within reach   Nurse Communication Mobility status        Time: 9085-9059 OT Time Calculation (min): 26 min  Charges: OT General Charges $OT Visit: 1 Visit OT Treatments $Self Care/Home Management : 8-22 mins $Therapeutic Activity: 8-22 mins  Wynn Kernes L. Joaquina Nissen, OTR/L  01/18/24, 12:29 PM

## 2024-01-18 NOTE — Progress Notes (Signed)
 Mobility Specialist - Progress Note     01/18/24 1600  Mobility  Activity Ambulated with assistance;Stood at bedside  Level of Assistance Contact guard assist, steadying assist  Assistive Device Front wheel walker  Distance Ambulated (ft) 35 ft  Range of Motion/Exercises Active  Activity Response Tolerated well  Mobility Referral Yes  Mobility visit 1 Mobility   Pt resting in bed on RA upon entry. Pt STS and ambulates to hallway around NS CGA with RW. Pt endorses being unable get up stairs to her home and trouble lifting legs very high. Pt maintained mildly steady amb with x1 buckle. Pt returned to bed and left with needs in reach. Family/friends present at bedside.   Guido Rumble Mobility Specialist 01/18/24, 4:12 PM

## 2024-01-18 NOTE — Discharge Instructions (Addendum)
 [facility]charge to [facility] on 01/18/2024 She needs to finish treatment for UTI.

## 2024-01-18 NOTE — ED Provider Notes (Addendum)
 Pt is getting placed to [facility]. Sitting up in bed in no distress. Family at bedside. Treatment written for rest of UTI course.    Ernest Ronal BRAVO, MD 01/18/24 1559    Ernest Ronal BRAVO, MD 01/18/24 [phone]

## 2024-01-18 NOTE — ED Notes (Signed)
Pt given breakfast meal at this time.

## 2024-01-18 NOTE — TOC CM/SW Note (Signed)
..  Transition of Care [facility]) - Inpatient Brief Assessment   Patient Details  Name: Jenna Carlson MRN: [id number] Date of Birth: Apr 19, 1951  Transition of Care [facility]) CM/SW Contact:    Edsel [name], LCSW Phone Number: 01/18/2024, 4:57 PM   Clinical Narrative:  SW received call from [facility] that pt was approved.  SW received email with authorization.  SW secured email information to [facility].  SW spoke with [facility] staff about pt being admitting today.  Will need pt summary as well.  Pt family will transport. [facility] stated that pt can be admitted today  Transition of Care Asessment:

## 2024-01-18 NOTE — ED Notes (Signed)
 Pt assisted walking to the in-room toilet and back with minimal help.

## 2024-01-18 NOTE — ED Notes (Signed)
 Report called to [facility] with [employer]

## 2024-02-04 ENCOUNTER — Ambulatory Visit: Payer: Self-pay | Admitting: Anesthesiology

## 2024-02-04 ENCOUNTER — Other Ambulatory Visit: Payer: Self-pay

## 2024-02-04 ENCOUNTER — Encounter: Payer: Self-pay | Admitting: Gastroenterology

## 2024-02-04 ENCOUNTER — Ambulatory Visit
Admission: RE | Admit: 2024-02-04 | Discharge: 2024-02-04 | Disposition: A | Discharge status: Home / Self Care | Attending: Gastroenterology | Admitting: Gastroenterology

## 2024-02-04 ENCOUNTER — Encounter
Admission: RE | Disposition: A | Discharge status: Home / Self Care | Payer: Self-pay | Source: Home / Self Care | Attending: Gastroenterology

## 2024-02-04 DIAGNOSIS — J45909 Unspecified asthma, uncomplicated: Secondary | ICD-10-CM | POA: Diagnosis not present

## 2024-02-04 DIAGNOSIS — I129 Hypertensive chronic kidney disease with stage 1 through stage 4 chronic kidney disease, or unspecified chronic kidney disease: Secondary | ICD-10-CM | POA: Diagnosis not present

## 2024-02-04 DIAGNOSIS — N183 Chronic kidney disease, stage 3 unspecified: Secondary | ICD-10-CM | POA: Diagnosis not present

## 2024-02-04 DIAGNOSIS — Z9221 Personal history of antineoplastic chemotherapy: Secondary | ICD-10-CM | POA: Insufficient documentation

## 2024-02-04 DIAGNOSIS — R1310 Dysphagia, unspecified: Secondary | ICD-10-CM | POA: Insufficient documentation

## 2024-02-04 DIAGNOSIS — E785 Hyperlipidemia, unspecified: Secondary | ICD-10-CM | POA: Diagnosis not present

## 2024-02-04 DIAGNOSIS — Z853 Personal history of malignant neoplasm of breast: Secondary | ICD-10-CM | POA: Diagnosis not present

## 2024-02-04 DIAGNOSIS — Z7902 Long term (current) use of antithrombotics/antiplatelets: Secondary | ICD-10-CM | POA: Insufficient documentation

## 2024-02-04 DIAGNOSIS — K222 Esophageal obstruction: Secondary | ICD-10-CM | POA: Diagnosis not present

## 2024-02-04 DIAGNOSIS — K449 Diaphragmatic hernia without obstruction or gangrene: Secondary | ICD-10-CM | POA: Diagnosis not present

## 2024-02-04 HISTORY — PX: ESOPHAGOGASTRODUODENOSCOPY: SHX5428

## 2024-02-04 SURGERY — EGD (ESOPHAGOGASTRODUODENOSCOPY)
Anesthesia: General

## 2024-02-04 MED ORDER — SODIUM CHLORIDE 0.9 % IV SOLN
INTRAVENOUS | Status: DC
  Administered 2024-02-04: 500 mL via INTRAVENOUS

## 2024-02-04 MED ORDER — PROPOFOL 10 MG/ML IV BOLUS
INTRAVENOUS | Status: AC
  Filled 2024-02-04: qty 20

## 2024-02-04 MED ORDER — LIDOCAINE HCL (PF) 2 % IJ SOLN
INTRAMUSCULAR | Status: DC | PRN
  Administered 2024-02-04: 100 mg via INTRADERMAL

## 2024-02-04 MED ORDER — PROPOFOL 500 MG/50ML IV EMUL
INTRAVENOUS | Status: DC | PRN
  Administered 2024-02-04: 200 ug/kg/min via INTRAVENOUS
  Administered 2024-02-04: 100 mg via INTRAVENOUS

## 2024-02-04 NOTE — Anesthesia Preprocedure Evaluation (Signed)
 Anesthesia Evaluation  Patient identified by MRN, date of birth, ID band Patient awake    Reviewed: Allergy & Precautions, H&P , NPO status , Patient's Chart, lab work & pertinent test results  History of Anesthesia Complications Negative for: history of anesthetic complications  Airway Mallampati: III  TM Distance: >3 FB Neck ROM: full    Dental  (+) Poor Dentition, Dental Advidsory Given   Pulmonary neg shortness of breath, asthma , neg COPD, neg recent URI   Pulmonary exam normal        Cardiovascular hypertension, (-) angina (-) Past MI and (-) Cardiac Stents Normal cardiovascular exam(-) dysrhythmias (-) Valvular Problems/Murmurs  ECG 01/02/24:  Sinus tachycardia with Premature atrial complexes T wave abnormality, consider inferolateral ischemia   Neuro/Psych neg Seizures PSYCHIATRIC DISORDERS  Depression    TIA (on Plavix )   GI/Hepatic Neg liver ROS, PUD,GERD  ,,Dysphagia with history of esophageal stenosis s/p dilatation   Endo/Other  negative endocrine ROS    Renal/GU Renal disease (stage III CKD)  negative genitourinary   Musculoskeletal   Abdominal   Peds  Hematology  (+) Blood dyscrasia, anemia Breast CA   Anesthesia Other Findings Past Medical History: No date: Allergic rhinitis No date: Asthma 1990's: Breast cancer (HCC)     Comment:  right breast No date: Cancer (HCC)     Comment:  BREAST No date: Depression No date: Edema No date: GERD (gastroesophageal reflux disease) No date: Hyperlipidemia No date: Hypertension 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, postmenopausal No date: Personal history of chemotherapy  Past Surgical History: 07/10/2022: COLONOSCOPY; N/A     Comment:  Procedure: COLONOSCOPY;  Surgeon: Onita Elspeth Sharper,              DO;  Location: [facility] ENDOSCOPY;  Service:               Gastroenterology;  Laterality: N/A; 01/15/2023: COLONOSCOPY; N/A     Comment:  Procedure: COLONOSCOPY;  Surgeon:  Onita Elspeth Sharper,              DO;  Location: [facility] ENDOSCOPY;  Service:               Gastroenterology;  Laterality: N/A; 02/16/2017: COLONOSCOPY WITH PROPOFOL ; N/A     Comment:  Procedure: COLONOSCOPY WITH PROPOFOL ;  Surgeon: Viktoria Lamar DASEN, MD;  Location: [facility] ENDOSCOPY;  Service:               Endoscopy;  Laterality: N/A; 07/10/2022: ESOPHAGOGASTRODUODENOSCOPY; N/A     Comment:  Procedure: ESOPHAGOGASTRODUODENOSCOPY (EGD);  Surgeon:               Onita Elspeth Sharper, DO;  Location: [facility] ENDOSCOPY;                Service: Gastroenterology;  Laterality: N/A; 07/24/2022: ESOPHAGOGASTRODUODENOSCOPY; N/A     Comment:  Procedure: ESOPHAGOGASTRODUODENOSCOPY (EGD);  Surgeon:               Onita Elspeth Sharper, DO;  Location: [facility] ENDOSCOPY;                Service: Gastroenterology;  Laterality: N/A; 02/16/2017: ESOPHAGOGASTRODUODENOSCOPY (EGD) WITH PROPOFOL ; N/A     Comment:  Procedure: ESOPHAGOGASTRODUODENOSCOPY (EGD) WITH               PROPOFOL ;  Surgeon: Viktoria Lamar DASEN, MD;  Location:               [facility] ENDOSCOPY;  Service: Endoscopy;  Laterality: N/A; 01/15/2023: HEMOSTASIS CLIP PLACEMENT     Comment:  Procedure: HEMOSTASIS CLIP PLACEMENT;  Surgeon: Onita Elspeth Sharper, DO;  Location: [facility] ENDOSCOPY;  Service:               Gastroenterology;; No date: MASTECTOMY 01/15/2023: POLYPECTOMY     Comment:  Procedure: POLYPECTOMY;  Surgeon: Onita Elspeth Sharper,              DO;  Location: [facility] ENDOSCOPY;  Service:               Gastroenterology;; 01/15/2023: SUBMUCOSAL LIFTING INJECTION     Comment:  Procedure: SUBMUCOSAL LIFTING INJECTION;  Surgeon:               Onita Elspeth Sharper, DO;  Location: [facility] ENDOSCOPY;                Service: Gastroenterology;; No date: WRIST SURGERY; Left  BMI    Body Mass Index: 25.79 kg/m      Reproductive/Obstetrics negative OB ROS                              Anesthesia  Physical Anesthesia Plan  ASA: 3  Anesthesia Plan: General   Post-op Pain Management: Minimal or no pain anticipated   Induction: Intravenous  PONV Risk Score and Plan: 3 and Propofol  infusion and TIVA  Airway Management Planned: Natural Airway and Nasal Cannula  Additional Equipment:   Intra-op Plan:   Post-operative Plan:   Informed Consent: I have reviewed the patients History and Physical, chart, labs and discussed the procedure including the risks, benefits and alternatives for the proposed anesthesia with the patient or authorized representative who has indicated his/her understanding and acceptance.   Patient has DNR.   Dental Advisory Given  Plan Discussed with: CRNA and Surgeon  Anesthesia Plan Comments:          Anesthesia Quick Evaluation

## 2024-02-04 NOTE — Transfer of Care (Signed)
 Immediate Anesthesia Transfer of Care Note  Patient: Jenna Carlson  Procedure(s) Performed: EGD (ESOPHAGOGASTRODUODENOSCOPY)  Patient Location: Endoscopy Unit  Anesthesia Type:General  Level of Consciousness: awake  Airway & Oxygen Therapy: Patient Spontanous Breathing  Post-op Assessment: Report given to RN and Post -op Vital signs reviewed and stable  Post vital signs: Reviewed and stable  Last Vitals:  Vitals Value Taken Time  BP 133/65 02/04/24 13:20  Temp    Pulse 78 02/04/24 13:20  Resp 21 02/04/24 13:20  SpO2 93 % 02/04/24 13:20  Vitals shown include unfiled device data.  Last Pain:  Vitals:   02/04/24 1146  TempSrc: Tympanic  PainSc: 0-No pain      Patients Stated Pain Goal: 4 (02/04/24 1146)  Complications: There were no known notable events for this encounter.

## 2024-02-04 NOTE — Op Note (Signed)
 [facility] Gastroenterology Patient Name: Jenna Carlson Procedure Date: 02/04/2024 12:12 PM MRN: [id number] Account #: [id number] Date of Birth: 1950-09-08 Admit Type: Outpatient Age: 73 Room: [facility] ENDO ROOM 2 Gender: Female Note Status: Finalized Instrument Name: Barnie GI Scope [id number] Procedure:             Upper GI endoscopy Indications:           Dysphagia, For therapy of esophageal stricture Providers:             Elspeth Ozell Onita ROSALEA, DO Referring MD:          Layman ORN. Lenon MD, MD (Referring MD) Medicines:             Monitored Anesthesia Care Complications:         No immediate complications. Estimated blood loss:                         Minimal. Procedure:             Pre-Anesthesia Assessment:                        - Prior to the procedure, a History and Physical was                         performed, and patient medications and allergies were                         reviewed. The patient is competent. The risks and                         benefits of the procedure and the sedation options and                         risks were discussed with the patient. All questions                         were answered and informed consent was obtained.                         Patient identification and proposed procedure were                         verified by the physician, the nurse, the anesthetist                         and the technician in the endoscopy suite. Mental                         Status Examination: alert and oriented. Airway                         Examination: normal oropharyngeal airway and neck                         mobility. Respiratory Examination: clear to                         auscultation. CV Examination: RRR, no murmurs, no S3  or S4. Prophylactic Antibiotics: The patient does not                         require prophylactic antibiotics. Prior                         Anticoagulants: The patient has  taken Plavix                          (clopidogrel ), last dose was 5 days prior to                         procedure. ASA Grade Assessment: III - A patient with                         severe systemic disease. After reviewing the risks and                         benefits, the patient was deemed in satisfactory                         condition to undergo the procedure. The anesthesia                         plan was to use monitored anesthesia care (MAC).                         Immediately prior to administration of medications,                         the patient was re-assessed for adequacy to receive                         sedatives. The heart rate, respiratory rate, oxygen                         saturations, blood pressure, adequacy of pulmonary                         ventilation, and response to care were monitored                         throughout the procedure. The physical status of the                         patient was re-assessed after the procedure.                        After obtaining informed consent, the endoscope was                         passed under direct vision. Throughout the procedure,                         the patient's blood pressure, pulse, and oxygen                         saturations were monitored continuously. The Endoscope  was introduced through the mouth, and advanced to the                         second part of duodenum. The upper GI endoscopy was                         accomplished without difficulty. The patient tolerated                         the procedure well. Findings:      The duodenal bulb, first portion of the duodenum and second portion of       the duodenum were normal. Estimated blood loss: none.      A medium-sized hiatal hernia was present. Estimated blood loss: none.      The exam of the stomach was otherwise normal.      One benign-appearing, intrinsic severe (stenosis; an endoscope cannot       pass)  stenosis was found 35 cm from the incisors. This stenosis measured       9 mm (inner diameter) x less than one cm (in length). The stenosis was       traversed after dilation. A TTS dilator was passed through the scope.       Dilation with an 01-25-09 mm balloon and a 03-30-11 mm balloon dilator was       performed to 8 mm, 9 mm, 10 mm and 11 mm. The dilation site was examined       following endoscope reinsertion and showed mild mucosal disruption.       Superficial disruption only occurred after 11mm tts balloon dilation.      The exam of the esophagus was otherwise normal. Impression:            - Normal duodenal bulb, first portion of the duodenum                         and second portion of the duodenum.                        - Medium-sized hiatal hernia.                        - Benign-appearing esophageal stenosis. Dilated.                        - No specimens collected. Recommendation:        - Patient has a contact number available for                         emergencies. The signs and symptoms of potential                         delayed complications were discussed with the patient.                         Return to normal activities tomorrow. Written                         discharge instructions were provided to the patient.                        -  Discharge patient to home.                        - Soft diet.                        - Continue present medications.                        - No ibuprofen, naproxen, or other non-steroidal                         anti-inflammatory drugs.                        - Use Protonix  (pantoprazole ) 40 mg PO BID for 8 weeks.                        - Resume Plavix  (clopidogrel ) at prior dose in 2 days.                         Refer to managing physician for further adjustment of                         therapy.                        - Repeat upper endoscopy in 3 weeks for retreatment.                        - Return to GI office at  appointment to be scheduled.                        - The findings and recommendations were discussed with                         the patient.                        - The findings and recommendations were discussed with                         the designated responsible adult. Procedure Code(s):     --- Professional ---                        [phone], Esophagogastroduodenoscopy, flexible,                         transoral; with transendoscopic balloon dilation of                         esophagus (less than 30 mm diameter) Diagnosis Code(s):     --- Professional ---                        K44.9, Diaphragmatic hernia without obstruction or                         gangrene  K22.2, Esophageal obstruction                        R13.10, Dysphagia, unspecified CPT copyright 2022 American [employer] Association. All rights reserved. The codes documented in this report are preliminary and upon coder review may  be revised to meet current compliance requirements. Attending Participation:      I personally performed the entire procedure. Elspeth Jungling, DO Elspeth Ozell Jungling DO, DO 02/04/2024 1:20:07 PM This report has been signed electronically. Number of Addenda: 0 Note Initiated On: 02/04/2024 12:12 PM Estimated Blood Loss:  Estimated blood loss was minimal.      [facility]

## 2024-02-04 NOTE — H&P (Signed)
 Pre-Procedure H&P   Patient ID: Jenna Carlson is a 73 y.o. female.  Gastroenterology Provider: Elspeth Ozell Jungling, DO  Referring Provider: Romero Antigua, PA PCP: Lenon Layman ORN, MD  Date: 02/04/2024  HPI Ms. Jenna Carlson is a 73 y.o. female who presents today for Esophagogastroduodenoscopy for Dysphagia, esophageal stenosis.  Patient with a history of esophageal stenosis status post dilation in 2024.  She was also appreciated have gastritis and esophagitis at that time. 2024 dilated to 11 mm and then further dilated thereafter Underwent EGD in July 20 25 to 15 mm TTS balloon with hiatal hernia present. This has improved her swallowing as well.  Reports upper esophageal sticking with pills and chicken.  When it sticks this will cause discomfort.  No odynophagia  Plavix  held for the procedure (5 days)  Hemoglobin 11.1 MCV 105 platelets 298,000   Past Medical History:  Diagnosis Date   Allergic rhinitis    Asthma    Breast cancer (HCC) 1990's   right breast   Cancer (HCC)    BREAST   Depression    Edema    GERD (gastroesophageal reflux disease)    Hyperlipidemia    Hypertension    Osteoporosis, postmenopausal    Personal history of chemotherapy     Past Surgical History:  Procedure Laterality Date   COLONOSCOPY N/A 07/10/2022   Procedure: COLONOSCOPY;  Surgeon: Jungling Elspeth Ozell, DO;  Location: [facility] ENDOSCOPY;  Service: Gastroenterology;  Laterality: N/A;   COLONOSCOPY N/A 01/15/2023   Procedure: COLONOSCOPY;  Surgeon: Jungling Elspeth Ozell, DO;  Location: [facility] ENDOSCOPY;  Service: Gastroenterology;  Laterality: N/A;   COLONOSCOPY WITH PROPOFOL  N/A 02/16/2017   Procedure: COLONOSCOPY WITH PROPOFOL ;  Surgeon: Viktoria Lamar DASEN, MD;  Location: [facility] ENDOSCOPY;  Service: Endoscopy;  Laterality: N/A;   ESOPHAGEAL DILATION  01/07/2024   Procedure: DILATION, ESOPHAGUS;  Surgeon: Jinny Carmine, MD;  Location: [facility] ENDOSCOPY;  Service: Endoscopy;;    ESOPHAGOGASTRODUODENOSCOPY N/A 07/10/2022   Procedure: ESOPHAGOGASTRODUODENOSCOPY (EGD);  Surgeon: Jungling Elspeth Ozell, DO;  Location: [facility] ENDOSCOPY;  Service: Gastroenterology;  Laterality: N/A;   ESOPHAGOGASTRODUODENOSCOPY N/A 07/24/2022   Procedure: ESOPHAGOGASTRODUODENOSCOPY (EGD);  Surgeon: Jungling Elspeth Ozell, DO;  Location: [facility] ENDOSCOPY;  Service: Gastroenterology;  Laterality: N/A;   ESOPHAGOGASTRODUODENOSCOPY N/A 01/07/2024   Procedure: EGD (ESOPHAGOGASTRODUODENOSCOPY);  Surgeon: Jinny Carmine, MD;  Location: [facility] ENDOSCOPY;  Service: Endoscopy;  Laterality: N/A;   ESOPHAGOGASTRODUODENOSCOPY (EGD) WITH PROPOFOL  N/A 02/16/2017   Procedure: ESOPHAGOGASTRODUODENOSCOPY (EGD) WITH PROPOFOL ;  Surgeon: Viktoria Lamar DASEN, MD;  Location: [facility] ENDOSCOPY;  Service: Endoscopy;  Laterality: N/A;   HEMOSTASIS CLIP PLACEMENT  01/15/2023   Procedure: HEMOSTASIS CLIP PLACEMENT;  Surgeon: Jungling Elspeth Ozell, DO;  Location: [facility] ENDOSCOPY;  Service: Gastroenterology;;   MASTECTOMY Right    POLYPECTOMY  01/15/2023   Procedure: POLYPECTOMY;  Surgeon: Jungling Elspeth Ozell, DO;  Location: [facility] ENDOSCOPY;  Service: Gastroenterology;;   ROBLEY LIFTING INJECTION  01/15/2023   Procedure: SUBMUCOSAL LIFTING INJECTION;  Surgeon: Jungling Elspeth Ozell, DO;  Location: [facility] ENDOSCOPY;  Service: Gastroenterology;;   WRIST SURGERY Left     Family History No h/o GI disease or malignancy  Review of Systems  Constitutional:  Negative for activity change, appetite change, chills, diaphoresis, fatigue, fever and unexpected weight change.  HENT:  Positive for trouble swallowing. Negative for voice change.   Respiratory:  Negative for shortness of breath and wheezing.   Cardiovascular:  Negative for chest pain, palpitations and leg swelling.  Gastrointestinal:  Negative for abdominal distention, abdominal pain, anal  bleeding, blood in stool, constipation, diarrhea, nausea, rectal pain and vomiting.   Musculoskeletal:  Negative for arthralgias and myalgias.  Skin:  Negative for color change and pallor.  Neurological:  Negative for dizziness, syncope and weakness.  Psychiatric/Behavioral:  Negative for confusion.   All other systems reviewed and are negative.    Medications No current facility-administered medications on file prior to encounter.   Current Outpatient Medications on File Prior to Encounter  Medication Sig Dispense Refill   acetaminophen  (TYLENOL ) 650 MG CR tablet Take 650 mg by mouth every 8 (eight) hours as needed.     ARIPiprazole  (ABILIFY ) 5 MG tablet Take 5 mg by mouth daily.     azelastine  (ASTELIN ) 0.1 % nasal spray Place into both nostrils 2 (two) times daily.     carvedilol  (COREG ) 6.25 MG tablet Take 6.25 mg by mouth 2 (two) times daily with a meal.     feeding supplement (ENSURE PLUS HIGH PROTEIN) LIQD Take 237 mLs by mouth 2 (two) times daily between meals.     fluticasone  furoate-vilanterol (BREO ELLIPTA ) 100-25 MCG/INH AEPB Inhale 1 puff into the lungs daily.     montelukast  (SINGULAIR ) 10 MG tablet Take 10 mg by mouth at bedtime.     pantoprazole  (PROTONIX ) 40 MG tablet Take 40 mg by mouth daily.     Potassium Chloride  40 MEQ/15ML (20%) SOLN Use as directed 20 mEq in the mouth or throat daily.     QUEtiapine  (SEROQUEL ) 25 MG tablet Take 25 mg by mouth at bedtime.     rosuvastatin  (CRESTOR ) 20 MG tablet Take 20 mg by mouth daily.     albuterol  (VENTOLIN  HFA) 108 (90 Base) MCG/ACT inhaler Inhale 1 puff into the lungs every 4 (four) hours as needed.     clopidogrel  (PLAVIX ) 75 MG tablet Take 75 mg by mouth daily.     EPINEPHrine 0.3 mg/0.3 mL IJ SOAJ injection Inject 0.3 mg into the muscle as needed.     hyoscyamine (LEVSIN SL) 0.125 MG SL tablet Take 0.125 mg by mouth every 6 (six) hours as needed. (Patient not taking: Reported on 01/03/2024)     Multiple Vitamins-Minerals (PRESERVISION AREDS 2) CAPS Take 1 capsule by mouth 2 (two) times daily.      ondansetron  (ZOFRAN ) 4 MG tablet Take 4 mg by mouth every 8 (eight) hours as needed.     sertraline  (ZOLOFT ) 50 MG tablet Take 50 mg by mouth daily. (Patient not taking: Reported on 02/04/2024)      Pertinent medications related to GI and procedure were reviewed by me with the patient prior to the procedure   Current Facility-Administered Medications:    0.9 %  sodium chloride  infusion, , Intravenous, Continuous, Onita Elspeth Sharper, DO, Last Rate: 20 mL/hr at 02/04/24 1156, 500 mL at 02/04/24 1156  sodium chloride  500 mL (02/04/24 1156)       Allergies  Allergen Reactions   Iodinated Contrast Media Shortness Of Breath   Hydrochlorothiazide Hives   Aspirin Other (See Comments) and Tinitus    Tinnitus   Codeine Other (See Comments)    Headache   Morphine And Codeine Nausea And Vomiting   Allergies were reviewed by me prior to the procedure  Objective   Body mass index is 24.41 kg/m. Vitals:   02/04/24 1146  BP: (!) 161/96  Pulse: 76  Resp: 15  Temp: (!) 97.2 F (36.2 C)  TempSrc: Tympanic  SpO2: 97%  Weight: 66.5 kg  Height: 5' 5 (1.651 m)  Physical Exam Vitals and nursing note reviewed.  Constitutional:      General: She is not in acute distress.    Appearance: Normal appearance. She is not ill-appearing, toxic-appearing or diaphoretic.  HENT:     Head: Normocephalic and atraumatic.     Nose: Nose normal.     Mouth/Throat:     Mouth: Mucous membranes are moist.     Pharynx: Oropharynx is clear.  Eyes:     General: No scleral icterus.    Extraocular Movements: Extraocular movements intact.  Cardiovascular:     Rate and Rhythm: Normal rate and regular rhythm.     Heart sounds: Normal heart sounds. No murmur heard.    No friction rub. No gallop.  Pulmonary:     Effort: Pulmonary effort is normal. No respiratory distress.     Breath sounds: Normal breath sounds. No wheezing, rhonchi or rales.  Abdominal:     General: Bowel sounds are normal. There is  no distension.     Palpations: Abdomen is soft.     Tenderness: There is no abdominal tenderness. There is no guarding or rebound.  Musculoskeletal:     Cervical back: Neck supple.     Right lower leg: No edema.     Left lower leg: No edema.  Skin:    General: Skin is warm and dry.     Coloration: Skin is not jaundiced or pale.  Neurological:     General: No focal deficit present.     Mental Status: She is alert and oriented to person, place, and time. Mental status is at baseline.  Psychiatric:        Mood and Affect: Mood normal.        Behavior: Behavior normal.        Thought Content: Thought content normal.        Judgment: Judgment normal.      Assessment:  Ms. Jenna Carlson is a 73 y.o. female  who presents today for Esophagogastroduodenoscopy for Dysphagia, esophageal stenosis .  Plan:  Esophagogastroduodenoscopy with possible intervention today  Esophagogastroduodenoscopy with possible biopsy, control of bleeding, polypectomy, and interventions as necessary has been discussed with the patient/patient representative. Informed consent was obtained from the patient/patient representative after explaining the indication, nature, and risks of the procedure including but not limited to death, bleeding, perforation, missed neoplasm/lesions, cardiorespiratory compromise, and reaction to medications. Opportunity for questions was given and appropriate answers were provided. Patient/patient representative has verbalized understanding is amenable to undergoing the procedure.   Elspeth Ozell Jungling, DO  [facility] Gastroenterology  Portions of the record may have been created with voice recognition software. Occasional wrong-word or 'sound-a-like' substitutions may have occurred due to the inherent limitations of voice recognition software.  Read the chart carefully and recognize, using context, where substitutions may have occurred.

## 2024-02-04 NOTE — Anesthesia Postprocedure Evaluation (Signed)
 Anesthesia Post Note  Patient: Jenna Carlson  Procedure(s) Performed: EGD (ESOPHAGOGASTRODUODENOSCOPY)  Patient location during evaluation: PACU Anesthesia Type: General Level of consciousness: awake and alert Pain management: pain level controlled Vital Signs Assessment: post-procedure vital signs reviewed and stable Respiratory status: spontaneous breathing, nonlabored ventilation and respiratory function stable Cardiovascular status: blood pressure returned to baseline and stable Postop Assessment: no apparent nausea or vomiting Anesthetic complications: no   There were no known notable events for this encounter.   Last Vitals:  Vitals:   02/04/24 1320 02/04/24 1340  BP:  (!) 135/98  Pulse:    Resp:    Temp: 36.7 C   SpO2:      Last Pain:  Vitals:   02/04/24 1340  TempSrc:   PainSc: 0-No pain                 Camellia Merilee Louder

## 2024-02-04 NOTE — Interval H&P Note (Signed)
 History and Physical Interval Note: Preprocedure H&P from 02/04/24  was reviewed and there was no interval change after seeing and examining the patient.  Written consent was obtained from the patient after discussion of risks, benefits, and alternatives. Patient has consented to proceed with Esophagogastroduodenoscopy with possible intervention   02/04/2024 12:42 PM  Alexxa F Bettendorf  has presented today for surgery, with the diagnosis of R13.19 (ICD-10-CM) - Esophageal dysphagia.  The various methods of treatment have been discussed with the patient and family. After consideration of risks, benefits and other options for treatment, the patient has consented to  Procedure(s) with comments: EGD (ESOPHAGOGASTRODUODENOSCOPY) (N/A) - Plavix  as a surgical intervention.  The patient's history has been reviewed, patient examined, no change in status, stable for surgery.  I have reviewed the patient's chart and labs.  Questions were answered to the patient's satisfaction.     Elspeth Ozell Jungling

## 2024-02-20 ENCOUNTER — Encounter: Payer: Self-pay | Admitting: Gastroenterology

## 2024-02-28 ENCOUNTER — Other Ambulatory Visit: Payer: Self-pay

## 2024-02-28 ENCOUNTER — Encounter: Payer: Self-pay | Admitting: Gastroenterology

## 2024-02-28 ENCOUNTER — Ambulatory Visit
Admission: RE | Admit: 2024-02-28 | Discharge: 2024-02-28 | Disposition: A | Discharge status: Home / Self Care | Attending: Gastroenterology | Admitting: Gastroenterology

## 2024-02-28 ENCOUNTER — Ambulatory Visit: Admitting: Anesthesiology

## 2024-02-28 ENCOUNTER — Encounter
Admission: RE | Disposition: A | Discharge status: Home / Self Care | Payer: Self-pay | Source: Home / Self Care | Attending: Gastroenterology

## 2024-02-28 DIAGNOSIS — Z8711 Personal history of peptic ulcer disease: Secondary | ICD-10-CM | POA: Insufficient documentation

## 2024-02-28 DIAGNOSIS — I10 Essential (primary) hypertension: Secondary | ICD-10-CM | POA: Diagnosis not present

## 2024-02-28 DIAGNOSIS — J45909 Unspecified asthma, uncomplicated: Secondary | ICD-10-CM | POA: Insufficient documentation

## 2024-02-28 DIAGNOSIS — K449 Diaphragmatic hernia without obstruction or gangrene: Secondary | ICD-10-CM | POA: Diagnosis not present

## 2024-02-28 DIAGNOSIS — K222 Esophageal obstruction: Secondary | ICD-10-CM | POA: Insufficient documentation

## 2024-02-28 DIAGNOSIS — K219 Gastro-esophageal reflux disease without esophagitis: Secondary | ICD-10-CM | POA: Insufficient documentation

## 2024-02-28 DIAGNOSIS — Z7902 Long term (current) use of antithrombotics/antiplatelets: Secondary | ICD-10-CM | POA: Diagnosis not present

## 2024-02-28 HISTORY — PX: ESOPHAGOGASTRODUODENOSCOPY: SHX5428

## 2024-02-28 SURGERY — EGD (ESOPHAGOGASTRODUODENOSCOPY)
Anesthesia: General

## 2024-02-28 MED ORDER — EPHEDRINE SULFATE-NACL 50-0.9 MG/10ML-% IV SOSY
PREFILLED_SYRINGE | INTRAVENOUS | Status: DC | PRN
  Administered 2024-02-28: 10 mg via INTRAVENOUS

## 2024-02-28 MED ORDER — SODIUM CHLORIDE 0.9 % IV SOLN: INTRAVENOUS | Status: DC

## 2024-02-28 MED ORDER — EPHEDRINE 5 MG/ML INJ
INTRAVENOUS | Status: AC
  Filled 2024-02-28: qty 5

## 2024-02-28 MED ORDER — PROPOFOL 10 MG/ML IV BOLUS
INTRAVENOUS | Status: DC | PRN
  Administered 2024-02-28: 100 mg via INTRAVENOUS
  Administered 2024-02-28: 50 mg via INTRAVENOUS

## 2024-02-28 MED ORDER — LIDOCAINE HCL (CARDIAC) PF 100 MG/5ML IV SOSY
PREFILLED_SYRINGE | INTRAVENOUS | Status: DC | PRN
  Administered 2024-02-28: 100 mg via INTRAVENOUS

## 2024-02-28 MED ORDER — PROPOFOL 1000 MG/100ML IV EMUL
INTRAVENOUS | Status: AC
  Filled 2024-02-28: qty 100

## 2024-02-28 MED ORDER — LIDOCAINE HCL (PF) 2 % IJ SOLN
INTRAMUSCULAR | Status: AC
  Filled 2024-02-28: qty 5

## 2024-02-28 NOTE — Anesthesia Postprocedure Evaluation (Signed)
 Anesthesia Post Note  Patient: Jenna Carlson  Procedure(s) Performed: EGD (ESOPHAGOGASTRODUODENOSCOPY)  Patient location during evaluation: Endoscopy Anesthesia Type: General Level of consciousness: awake and alert Pain management: pain level controlled Vital Signs Assessment: post-procedure vital signs reviewed and stable Respiratory status: spontaneous breathing, nonlabored ventilation and respiratory function stable Cardiovascular status: blood pressure returned to baseline and stable Postop Assessment: no apparent nausea or vomiting Anesthetic complications: no   No notable events documented.   Last Vitals:  Vitals:   02/28/24 0914 02/28/24 0922  BP: 106/87 119/84  Pulse: 72 72  Resp: 18 19  Temp:    SpO2: 96% 96%    Last Pain:  Vitals:   02/28/24 0922  TempSrc:   PainSc: 0-No pain                 Fairy POUR Arely Tinner

## 2024-02-28 NOTE — Op Note (Signed)
 [facility] Gastroenterology Patient Name: Jenna Carlson Procedure Date: 02/28/2024 8:24 AM MRN: [id number] Account #: [id number] Date of Birth: May 18, 1951 Admit Type: Outpatient Age: 73 Room: [facility] ENDO ROOM 1 Gender: Female Note Status: Finalized Instrument Name: Barnie GI Scope [id number] Procedure:             Upper GI endoscopy Indications:           esophageal stricture Providers:             Elspeth Ozell Onita ROSALEA, DO Referring MD:          Layman ORN. Lenon MD, MD (Referring MD) Medicines:             Monitored Anesthesia Care Complications:         No immediate complications. Estimated blood loss:                         Minimal. Procedure:             Pre-Anesthesia Assessment:                        - Prior to the procedure, a History and Physical was                         performed, and patient medications and allergies were                         reviewed. The patient is competent. The risks and                         benefits of the procedure and the sedation options and                         risks were discussed with the patient. All questions                         were answered and informed consent was obtained.                         Patient identification and proposed procedure were                         verified by the physician, the nurse, the anesthetist                         and the technician in the endoscopy suite. Mental                         Status Examination: alert and oriented. Airway                         Examination: normal oropharyngeal airway and neck                         mobility. Respiratory Examination: clear to                         auscultation. CV Examination: RRR, no murmurs, no S3  or S4. Prophylactic Antibiotics: The patient does not                         require prophylactic antibiotics. Prior                         Anticoagulants: The patient has taken Plavix                           (clopidogrel ), last dose was 6 days prior to                         procedure. ASA Grade Assessment: III - A patient with                         severe systemic disease. After reviewing the risks and                         benefits, the patient was deemed in satisfactory                         condition to undergo the procedure. The anesthesia                         plan was to use monitored anesthesia care (MAC).                         Immediately prior to administration of medications,                         the patient was re-assessed for adequacy to receive                         sedatives. The heart rate, respiratory rate, oxygen                         saturations, blood pressure, adequacy of pulmonary                         ventilation, and response to care were monitored                         throughout the procedure. The physical status of the                         patient was re-assessed after the procedure.                        After obtaining informed consent, the endoscope was                         passed under direct vision. Throughout the procedure,                         the patient's blood pressure, pulse, and oxygen                         saturations were monitored continuously. The Endoscope  was introduced through the mouth, and advanced to the                         second part of duodenum. The upper GI endoscopy was                         accomplished without difficulty. The patient tolerated                         the procedure well. Findings:      The duodenal bulb, first portion of the duodenum and second portion of       the duodenum were normal. Estimated blood loss: none.      A 5 cm hiatal hernia was present. Estimated blood loss: none.      The exam of the stomach was otherwise normal.      Esophagogastric landmarks were identified: the gastroesophageal junction       was found at 30 cm from the incisors.       The Z-line was regular. Estimated blood loss: none.      One benign-appearing, intrinsic severe (stenosis; an endoscope cannot       pass) stenosis was found 30 cm from the incisors. This stenosis measured       1 cm (inner diameter) x less than one cm (in length). The stenosis was       traversed after dilation. A TTS dilator was passed through the scope.       Dilation with a 03-30-11 mm balloon dilator was performed to 11 mm and       12 mm. The dilation site was examined following endoscope reinsertion       and showed mild mucosal disruption. Estimated blood loss was minimal.      The examined esophagus was moderately tortuous. Estimated blood loss:       none.      The exam of the esophagus was otherwise normal. Impression:            - Normal duodenal bulb, first portion of the duodenum                         and second portion of the duodenum.                        - 5 cm hiatal hernia.                        - Esophagogastric landmarks identified.                        - Z-line regular.                        - Benign-appearing esophageal stenosis. Dilated.                        - Tortuous esophagus.                        - No specimens collected. Recommendation:        - Patient has a contact number available for  emergencies. The signs and symptoms of potential                         delayed complications were discussed with the patient.                         Return to normal activities tomorrow. Written                         discharge instructions were provided to the patient.                        - Discharge patient to home.                        - Soft diet today.                        - Continue present medications.                        - No ibuprofen, naproxen, or other non-steroidal                         anti-inflammatory drugs for 5 days.                        - Resume Plavix  (clopidogrel ) at prior dose tomorrow.                          Refer to managing physician for further adjustment of                         therapy.                        - Repeat upper endoscopy PRN for retreatment.                        - Return to GI office as previously scheduled.                        - The findings and recommendations were discussed with                         the patient. Procedure Code(s):     --- Professional ---                        [phone], Esophagogastroduodenoscopy, flexible,                         transoral; with transendoscopic balloon dilation of                         esophagus (less than 30 mm diameter) Diagnosis Code(s):     --- Professional ---                        K44.9, Diaphragmatic hernia without obstruction or  gangrene                        K22.2, Esophageal obstruction                        Q39.9, Congenital malformation of esophagus,                         unspecified CPT copyright 2022 American Medical Association. All rights reserved. The codes documented in this report are preliminary and upon coder review may  be revised to meet current compliance requirements. Attending Participation:      I personally performed the entire procedure. Elspeth Jungling, DO Elspeth Ozell Jungling DO, DO 02/28/2024 8:54:18 AM This report has been signed electronically. Number of Addenda: 0 Note Initiated On: 02/28/2024 8:24 AM Estimated Blood Loss:  Estimated blood loss was minimal.      [facility]

## 2024-02-28 NOTE — Interval H&P Note (Signed)
 History and Physical Interval Note: Preprocedure H&P from 02/28/24  was reviewed and there was no interval change after seeing and examining the patient.  Written consent was obtained from the patient after discussion of risks, benefits, and alternatives. Patient has consented to proceed with Esophagogastroduodenoscopy with possible intervention   02/28/2024 8:30 AM  Jenna Carlson  has presented today for surgery, with the diagnosis of Esophageal stricture (K22.2).  The various methods of treatment have been discussed with the patient and family. After consideration of risks, benefits and other options for treatment, the patient has consented to  Procedure(s): EGD (ESOPHAGOGASTRODUODENOSCOPY) (N/A) as a surgical intervention.  The patient's history has been reviewed, patient examined, no change in status, stable for surgery.  I have reviewed the patient's chart and labs.  Questions were answered to the patient's satisfaction.     Elspeth Ozell Jungling

## 2024-02-28 NOTE — H&P (Signed)
 Pre-Procedure H&P   Patient ID: Jenna Carlson is a 73 y.o. female.  Gastroenterology Provider: Elspeth Ozell Jungling, DO  PCP: Lenon Layman ORN, MD  Date: 02/28/2024  HPI Ms. Jenna Carlson is a 73 y.o. female who presents today for Esophagogastroduodenoscopy for Esophageal stricture .  Last underwent EGD with dilation in August.  She was dilated to 11 mm with TTS balloon with superficial disruption.  Overall her symptoms have been improving.  Last dose of Plavix  September 4th  Past Medical History:  Diagnosis Date   Allergic rhinitis    Asthma    Breast cancer (HCC) 1990's   right breast   Cancer (HCC)    BREAST   Depression    Edema    GERD (gastroesophageal reflux disease)    Hyperlipidemia    Hypertension    Osteoporosis, postmenopausal    Personal history of chemotherapy     Past Surgical History:  Procedure Laterality Date   COLONOSCOPY N/A 07/10/2022   Procedure: COLONOSCOPY;  Surgeon: Jungling Elspeth Ozell, DO;  Location: [facility] ENDOSCOPY;  Service: Gastroenterology;  Laterality: N/A;   COLONOSCOPY N/A 01/15/2023   Procedure: COLONOSCOPY;  Surgeon: Jungling Elspeth Ozell, DO;  Location: [facility] ENDOSCOPY;  Service: Gastroenterology;  Laterality: N/A;   COLONOSCOPY WITH PROPOFOL  N/A 02/16/2017   Procedure: COLONOSCOPY WITH PROPOFOL ;  Surgeon: Viktoria Lamar DASEN, MD;  Location: [facility] ENDOSCOPY;  Service: Endoscopy;  Laterality: N/A;   ESOPHAGEAL DILATION  01/07/2024   Procedure: DILATION, ESOPHAGUS;  Surgeon: Jinny Carmine, MD;  Location: [facility] ENDOSCOPY;  Service: Endoscopy;;   ESOPHAGOGASTRODUODENOSCOPY N/A 07/10/2022   Procedure: ESOPHAGOGASTRODUODENOSCOPY (EGD);  Surgeon: Jungling Elspeth Ozell, DO;  Location: [facility] ENDOSCOPY;  Service: Gastroenterology;  Laterality: N/A;   ESOPHAGOGASTRODUODENOSCOPY N/A 07/24/2022   Procedure: ESOPHAGOGASTRODUODENOSCOPY (EGD);  Surgeon: Jungling Elspeth Ozell, DO;  Location: [facility] ENDOSCOPY;  Service: Gastroenterology;   Laterality: N/A;   ESOPHAGOGASTRODUODENOSCOPY N/A 01/07/2024   Procedure: EGD (ESOPHAGOGASTRODUODENOSCOPY);  Surgeon: Jinny Carmine, MD;  Location: [facility] ENDOSCOPY;  Service: Endoscopy;  Laterality: N/A;   ESOPHAGOGASTRODUODENOSCOPY N/A 02/04/2024   Procedure: EGD (ESOPHAGOGASTRODUODENOSCOPY);  Surgeon: Jungling Elspeth Ozell, DO;  Location: [facility] ENDOSCOPY;  Service: Gastroenterology;  Laterality: N/A;  Plavix    ESOPHAGOGASTRODUODENOSCOPY (EGD) WITH PROPOFOL  N/A 02/16/2017   Procedure: ESOPHAGOGASTRODUODENOSCOPY (EGD) WITH PROPOFOL ;  Surgeon: Viktoria Lamar DASEN, MD;  Location: [facility] ENDOSCOPY;  Service: Endoscopy;  Laterality: N/A;   HEMOSTASIS CLIP PLACEMENT  01/15/2023   Procedure: HEMOSTASIS CLIP PLACEMENT;  Surgeon: Jungling Elspeth Ozell, DO;  Location: [facility] ENDOSCOPY;  Service: Gastroenterology;;   MASTECTOMY Right    POLYPECTOMY  01/15/2023   Procedure: POLYPECTOMY;  Surgeon: Jungling Elspeth Ozell, DO;  Location: [facility] ENDOSCOPY;  Service: Gastroenterology;;   ROBLEY LIFTING INJECTION  01/15/2023   Procedure: SUBMUCOSAL LIFTING INJECTION;  Surgeon: Jungling Elspeth Ozell, DO;  Location: [facility] ENDOSCOPY;  Service: Gastroenterology;;   WRIST SURGERY Left     Family History No h/o GI disease or malignancy  Review of Systems  Constitutional:  Negative for activity change, appetite change, chills, diaphoresis, fatigue, fever and unexpected weight change.  HENT:  Positive for trouble swallowing. Negative for voice change.   Respiratory:  Negative for shortness of breath and wheezing.   Cardiovascular:  Negative for chest pain, palpitations and leg swelling.  Gastrointestinal:  Negative for abdominal distention, abdominal pain, anal bleeding, blood in stool, constipation, diarrhea, nausea, rectal pain and vomiting.  Musculoskeletal:  Negative for arthralgias and myalgias.  Skin:  Negative for color change and pallor.  Neurological:  Negative for dizziness, syncope  and weakness.   Psychiatric/Behavioral:  Negative for confusion.   All other systems reviewed and are negative.    Medications No current facility-administered medications on file prior to encounter.   Current Outpatient Medications on File Prior to Encounter  Medication Sig Dispense Refill   albuterol  (VENTOLIN  HFA) 108 (90 Base) MCG/ACT inhaler Inhale 1 puff into the lungs every 4 (four) hours as needed.     azelastine  (ASTELIN ) 0.1 % nasal spray Place into both nostrils 2 (two) times daily.     carvedilol  (COREG ) 6.25 MG tablet Take 6.25 mg by mouth 2 (two) times daily with a meal.     feeding supplement (ENSURE PLUS HIGH PROTEIN) LIQD Take 237 mLs by mouth 2 (two) times daily between meals.     fluticasone  furoate-vilanterol (BREO ELLIPTA ) 100-25 MCG/INH AEPB Inhale 1 puff into the lungs daily.     montelukast  (SINGULAIR ) 10 MG tablet Take 10 mg by mouth at bedtime.     pantoprazole  (PROTONIX ) 40 MG tablet Take 40 mg by mouth daily.     QUEtiapine  (SEROQUEL ) 25 MG tablet Take 25 mg by mouth at bedtime.     rosuvastatin  (CRESTOR ) 20 MG tablet Take 20 mg by mouth daily.     acetaminophen  (TYLENOL ) 650 MG CR tablet Take 650 mg by mouth every 8 (eight) hours as needed.     ARIPiprazole  (ABILIFY ) 5 MG tablet Take 5 mg by mouth daily.     clopidogrel  (PLAVIX ) 75 MG tablet Take 75 mg by mouth daily.     EPINEPHrine 0.3 mg/0.3 mL IJ SOAJ injection Inject 0.3 mg into the muscle as needed.     hyoscyamine (LEVSIN SL) 0.125 MG SL tablet Take 0.125 mg by mouth every 6 (six) hours as needed. (Patient not taking: Reported on 01/03/2024)     Multiple Vitamins-Minerals (PRESERVISION AREDS 2) CAPS Take 1 capsule by mouth 2 (two) times daily.     ondansetron  (ZOFRAN ) 4 MG tablet Take 4 mg by mouth every 8 (eight) hours as needed.     Potassium Chloride  40 MEQ/15ML (20%) SOLN Use as directed 20 mEq in the mouth or throat daily.     sertraline  (ZOLOFT ) 50 MG tablet Take 50 mg by mouth daily. (Patient not taking: Reported  on 02/04/2024)      Pertinent medications related to GI and procedure were reviewed by me with the patient prior to the procedure   Current Facility-Administered Medications:    0.9 %  sodium chloride  infusion, , Intravenous, Continuous, Onita Elspeth Sharper, DO, Last Rate: 20 mL/hr at 02/28/24 0829, New Bag at 02/28/24 0829  sodium chloride  20 mL/hr at 02/28/24 9170       Allergies  Allergen Reactions   Iodinated Contrast Media Shortness Of Breath   Hydrochlorothiazide Hives   Aspirin Other (See Comments) and Tinitus    Tinnitus   Codeine Other (See Comments)    Headache   Morphine And Codeine Nausea And Vomiting   Allergies were reviewed by me prior to the procedure  Objective   There is no height or weight on file to calculate BMI. Vitals:   02/28/24 0827  BP: (!) 131/50  Pulse: 82  Resp: 14  Temp: (!) 96 F (35.6 C)  TempSrc: Temporal  SpO2: 98%     Physical Exam Vitals and nursing note reviewed.  Constitutional:      General: She is not in acute distress.    Appearance: Normal appearance. She is not ill-appearing, toxic-appearing or diaphoretic.  HENT:  Head: Normocephalic and atraumatic.     Nose: Nose normal.     Mouth/Throat:     Mouth: Mucous membranes are moist.     Pharynx: Oropharynx is clear.  Eyes:     General: No scleral icterus.    Extraocular Movements: Extraocular movements intact.  Cardiovascular:     Rate and Rhythm: Normal rate and regular rhythm.     Heart sounds: Normal heart sounds. No murmur heard.    No friction rub. No gallop.  Pulmonary:     Effort: Pulmonary effort is normal. No respiratory distress.     Breath sounds: Normal breath sounds. No wheezing, rhonchi or rales.  Abdominal:     General: Bowel sounds are normal. There is no distension.     Palpations: Abdomen is soft.     Tenderness: There is no abdominal tenderness. There is no guarding or rebound.  Musculoskeletal:     Cervical back: Neck supple.     Right  lower leg: No edema.     Left lower leg: No edema.  Skin:    General: Skin is warm and dry.     Coloration: Skin is not jaundiced or pale.  Neurological:     General: No focal deficit present.     Mental Status: She is alert and oriented to person, place, and time. Mental status is at baseline.  Psychiatric:        Mood and Affect: Mood normal.        Behavior: Behavior normal.        Thought Content: Thought content normal.        Judgment: Judgment normal.      Assessment:  Jenna Carlson is a 73 y.o. female  who presents today for Esophagogastroduodenoscopy for Esophageal stricture .  Plan:  Esophagogastroduodenoscopy with possible intervention today  Esophagogastroduodenoscopy with possible biopsy, control of bleeding, polypectomy, and interventions as necessary has been discussed with the patient/patient representative. Informed consent was obtained from the patient/patient representative after explaining the indication, nature, and risks of the procedure including but not limited to death, bleeding, perforation, missed neoplasm/lesions, cardiorespiratory compromise, and reaction to medications. Opportunity for questions was given and appropriate answers were provided. Patient/patient representative has verbalized understanding is amenable to undergoing the procedure.   Elspeth Ozell Jungling, DO  [facility] Gastroenterology  Portions of the record may have been created with voice recognition software. Occasional wrong-word or 'sound-a-like' substitutions may have occurred due to the inherent limitations of voice recognition software.  Read the chart carefully and recognize, using context, where substitutions may have occurred.

## 2024-02-28 NOTE — Anesthesia Preprocedure Evaluation (Signed)
 Anesthesia Evaluation  Patient identified by MRN, date of birth, ID band Patient awake    Reviewed: Allergy & Precautions, NPO status , Patient's Chart, lab work & pertinent test results  Airway Mallampati: III  TM Distance: <3 FB Neck ROM: full    Dental  (+) Chipped   Pulmonary asthma    Pulmonary exam normal        Cardiovascular Exercise Tolerance: Good hypertension, (-) angina Normal cardiovascular exam     Neuro/Psych negative neurological ROS  negative psych ROS   GI/Hepatic Neg liver ROS, PUD,GERD  Controlled,,  Endo/Other  negative endocrine ROS    Renal/GU Renal disease  negative genitourinary   Musculoskeletal   Abdominal   Peds  Hematology negative hematology ROS (+)   Anesthesia Other Findings Patient reports that they do not think that any food or pills are stuck in their throat at this time.  Past Medical History: No date: Allergic rhinitis No date: Asthma 1990's: Breast cancer (HCC)     Comment:  right breast No date: Cancer (HCC)     Comment:  BREAST No date: Depression No date: Edema No date: GERD (gastroesophageal reflux disease) No date: Hyperlipidemia No date: Hypertension 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, postmenopausal No date: Personal history of chemotherapy  Past Surgical History: 07/10/2022: COLONOSCOPY; N/A     Comment:  Procedure: COLONOSCOPY;  Surgeon: Onita Elspeth Sharper,              DO;  Location: [facility] ENDOSCOPY;  Service:               Gastroenterology;  Laterality: N/A; 01/15/2023: COLONOSCOPY; N/A     Comment:  Procedure: COLONOSCOPY;  Surgeon: Onita Elspeth Sharper,              DO;  Location: [facility] ENDOSCOPY;  Service:               Gastroenterology;  Laterality: N/A; 02/16/2017: COLONOSCOPY WITH PROPOFOL ; N/A     Comment:  Procedure: COLONOSCOPY WITH PROPOFOL ;  Surgeon: Viktoria Lamar DASEN, MD;  Location: [facility] ENDOSCOPY;  Service:               Endoscopy;   Laterality: N/A; 01/07/2024: ESOPHAGEAL DILATION     Comment:  Procedure: DILATION, ESOPHAGUS;  Surgeon: Jinny Carmine,               MD;  Location: [facility] ENDOSCOPY;  Service: Endoscopy;; 07/10/2022: ESOPHAGOGASTRODUODENOSCOPY; N/A     Comment:  Procedure: ESOPHAGOGASTRODUODENOSCOPY (EGD);  Surgeon:               Onita Elspeth Sharper, DO;  Location: [facility] ENDOSCOPY;                Service: Gastroenterology;  Laterality: N/A; 07/24/2022: ESOPHAGOGASTRODUODENOSCOPY; N/A     Comment:  Procedure: ESOPHAGOGASTRODUODENOSCOPY (EGD);  Surgeon:               Onita Elspeth Sharper, DO;  Location: [facility] ENDOSCOPY;                Service: Gastroenterology;  Laterality: N/A; 01/07/2024: ESOPHAGOGASTRODUODENOSCOPY; N/A     Comment:  Procedure: EGD (ESOPHAGOGASTRODUODENOSCOPY);  Surgeon:               Jinny Carmine, MD;  Location: [facility] ENDOSCOPY;  Service:               Endoscopy;  Laterality: N/A; 02/04/2024: ESOPHAGOGASTRODUODENOSCOPY; N/A     Comment:  Procedure: EGD (ESOPHAGOGASTRODUODENOSCOPY);  Surgeon:               Onita Elspeth Sharper, DO;  Location: [facility] ENDOSCOPY;                Service: Gastroenterology;  Laterality: N/A;  Plavix  02/16/2017: ESOPHAGOGASTRODUODENOSCOPY (EGD) WITH PROPOFOL ; N/A     Comment:  Procedure: ESOPHAGOGASTRODUODENOSCOPY (EGD) WITH               PROPOFOL ;  Surgeon: Viktoria Lamar DASEN, MD;  Location:               [facility] ENDOSCOPY;  Service: Endoscopy;  Laterality: N/A; 01/15/2023: HEMOSTASIS CLIP PLACEMENT     Comment:  Procedure: HEMOSTASIS CLIP PLACEMENT;  Surgeon: Onita Elspeth Sharper, DO;  Location: [facility] ENDOSCOPY;  Service:               Gastroenterology;; No date: MASTECTOMY; Right 01/15/2023: POLYPECTOMY     Comment:  Procedure: POLYPECTOMY;  Surgeon: Onita Elspeth Sharper,              DO;  Location: [facility] ENDOSCOPY;  Service:               Gastroenterology;; 01/15/2023: ROBLEY LIFTING INJECTION     Comment:  Procedure: SUBMUCOSAL LIFTING  INJECTION;  Surgeon:               Onita Elspeth Sharper, DO;  Location: [facility] ENDOSCOPY;                Service: Gastroenterology;; No date: WRIST SURGERY; Left     Reproductive/Obstetrics negative OB ROS                              Anesthesia Physical Anesthesia Plan  ASA: 3  Anesthesia Plan: General   Post-op Pain Management:    Induction: Intravenous  PONV Risk Score and Plan: Propofol  infusion and TIVA  Airway Management Planned: Natural Airway and Nasal Cannula  Additional Equipment:   Intra-op Plan:   Post-operative Plan:   Informed Consent: I have reviewed the patients History and Physical, chart, labs and discussed the procedure including the risks, benefits and alternatives for the proposed anesthesia with the patient or authorized representative who has indicated his/her understanding and acceptance.     Dental Advisory Given  Plan Discussed with: Anesthesiologist, CRNA and Surgeon  Anesthesia Plan Comments: (Patient consented for risks of anesthesia including but not limited to:  - adverse reactions to medications - risk of airway placement if required - damage to eyes, teeth, lips or other oral mucosa - nerve damage due to positioning  - sore throat or hoarseness - Damage to heart, brain, nerves, lungs, other parts of body or loss of life  Patient voiced understanding and assent.)        Anesthesia Quick Evaluation

## 2024-02-28 NOTE — Transfer of Care (Signed)
 Immediate Anesthesia Transfer of Care Note  Patient: Jenna Carlson  Procedure(s) Performed: EGD (ESOPHAGOGASTRODUODENOSCOPY)  Patient Location: Endoscopy Unit  Anesthesia Type:MAC  Level of Consciousness: awake and alert   Airway & Oxygen Therapy: Patient Spontanous Breathing and Patient connected to nasal cannula oxygen  Post-op Assessment: Report given to RN and Post -op Vital signs reviewed and stable  Post vital signs: Reviewed and stable  Last Vitals:  Vitals Value Taken Time  BP 120/71 02/28/24 0901  Temp 35.7 C 02/28/24 08:54  Pulse 72 02/28/24 09:01  Resp 20 02/28/24 09:01  SpO2 94 % 02/28/24 09:01  Vitals shown include unfiled device data.  Last Pain:  Vitals:   02/28/24 0854  TempSrc: Temporal  PainSc: 0-No pain         Complications: No notable events documented.

## 2024-03-05 NOTE — Progress Notes (Signed)
 MEDICARE WELLNESS VISIT   PROVIDERS RENDERING CARE [facility], Dr Lenon   FUNCTIONAL ASSESSMENT  (1) Hearing: Demonstrates normal hearing in conversation.  (2) Risk of Falls: No reports of falls or abnormal balance. Gait is observed to be good upon observation.  (3) Home Safety; Home is safe and secure (4) Activities of Daily Living; Household chores and grooming are managed without problems. Personal finances are managed without problems.    DEPRESSION SCREENING There does not seem to be loss of interest in activities nor excess crying or changes in sleep or appetite.    COGNITIVE SCREENING Orientation is appropriate as are responses to questions and general conversation. No reports of forgetfulness or losing things.      PREVENTION PLAN Cardiovascular; Yearly cholesterol level with elevation history Diabetes; Yearly  Mammogram; Yearly via gyn  Bone Density; last 2014 mild osteopenia, gyn ordering on vitamin d Colon Cancer; Last 2014 with polyps, 8-18 incomplete as unable to pass scope past ascending colon and repeated 1-24 with large polyps and 7-24 needing repeat 7-25 Glaucoma; Yearly  Pneumonia; 2016 pneumovax and prevnar in 2017 Shingles: Zostavax 2011 and shingrix 10-19 covid vaccine; Has had Tdap; 2012 Influenza; Yearly Smoking Cessation; na     OTHER PERSONALIZED HEALTH ADVISE Goes to a trainer and water aerobics and low salt diet with edema   END OF LIFE CARE WANTS Full code, no long term support     PHQ 2/9 last 3 flowsheet values     09/03/2023 12/18/2023 03/05/2024  PHQ-9 Depression Screening   Little interest or pleasure in doing things 0 1 0  Feeling down, depressed, or hopeless 0 1 0      Depression Severity and Treatment Recommendations:  0-4= None  5-9= Mild / Treatment: Support, educate to call if worse; return in one month  10-14= Moderate / Treatment: Support, watchful waiting; Antidepressant or Psychotherapy  15-19= Moderately severe /  Treatment: Antidepressant OR Psychotherapy  >= 20 = Major depression, severe / Antidepressant AND Psychotherapy  Please note approximately 15 minutes was spent and depression screening by me and nursing staff.     We discussed an exercise program and its benefits as well as a healthy diet including healthy fats and vegetables. These are very important parts of healthy living and cardiovascular health. 15 minutes was devoted to this.     Jenna Carlson is a 73 y.o. female here for her annual exam with health maintenance    Chief Complaint  Annual exam   HISTORY OF PRESENT ILLNESS  Asthma Rescue inhaler use is not escalating and no increase in flairs has been noted.    HTN (hypertension), benign Taking medications without noted side effects or dizziness.    HLD (hyperlipidemia) Healthy fat diet is being followed and no myalgia's are noted.    Review Of Systems Constitutional; No weight loss, fever, chills, weakness  HEENT: No visual loss, blurred vision, hearing loss, ear pain, runny nose or sore throat SKIN; No rash or itching CARDIOVASCULAR; No chest pain, pressure. No palpitations or edema RESPIRATORY; No shortness of breath, cough or sputum GASTROINTESTINAL; No nausea, vomiting, diarrhea or dysphagia GENITOURINARY; No dysuria, new incontinence, suprapubic pain NEUROLOGICAL; No headache, dizziness, syncopy, tingling MUSCULOSKELETAL; No muscle or joint pain or injuries HEMATOLOGICAL; No anemia, bleeding or abnormal bruising noted PSYCHIATRIC; No manic symptoms or severely blue mood ENDOCRINE; No sweating, temperature intolerance or polyuria, polydipsia   Patient Active Problem List  Diagnosis  . Edema  . HLD (hyperlipidemia)  . Asthma (  HHS-HCC)  . HTN (hypertension), benign  . Major depression in remission ()  . Health care maintenance  . Osteopenia  . Weakness generalized  . Expressive aphasia    Past Medical History:  Diagnosis Date  . Allergic  rhinitis   . Asthma without status asthmaticus (HHS-HCC)   . Breast cancer (CMS/HHS-HCC)   . GERD (gastroesophageal reflux disease)   . Hyperlipidemia   . Hypertension   . Osteoporosis, post-menopausal     Past Surgical History:  Procedure Laterality Date  . COLONOSCOPY  02/16/2017   Adenomatous Polyp: CBF 01/2022  . EGD  02/16/2017   Gastritis: No repeat per RTE  . CATARACT EXTRACTION Left 08/19/2021  . Colon @ [facility]  07/10/2022   Tubular adenomas/Hyperplastic polyps/PHx CP/Repeat 60months/SMR  . EGD @ [facility]  07/10/2022   Gastritis/Esophagitis/Esophageal stenosis.Dilated/Repeat scheduled for 07/24/2022/SMR  . EGD @ [facility]  07/24/2022   Abnormal esophageal motility, consistent with  presbyesophagus/Esophageal stenosis.Dilated/Repeat PRN/SMR  . EGD  07/24/2022   EsophagealStenosis/NoRpt/TKT  . Colon @ [facility]  01/15/2023   Tubular adenomas/Hyperplastic polyps/PHx CP/Repeat 17yr/SMR  . EGD@[facility]   02/04/2024   HH/EsophagealStenosis/Rpt3weeks/SMR  . COLONOSCOPY  03/26/2013, 10/26/2008, 04/23/2004   Adenomatous Polyp: CBF 03/2018  . Left distal radius ORIF    . MASTECTOMY      Family History  Problem Relation Name Age of Onset  . Osteoporosis (Thinning of bones) Mother    . Myocardial Infarction (Heart attack) Father    . Allergies Other    . Asthma Other    . High blood pressure (Hypertension) Other    . Osteoporosis (Thinning of bones) Other      Allergies  Allergen Reactions  . Iodinated Contrast Media Shortness Of Breath  . Metrizamide Shortness Of Breath  . Aspirin Other (See Comments) and Tinnitus    Tinnitus  Tinnitus    . Codeine Headache  . Egg Other (See Comments)    On testing  . Egg Derived Other (See Comments)    On testing    . Hydrochlorothiazide Hives, Unknown and Other (See Comments)  . Morphine Vomiting  . Opioids - Morphine Analogues Nausea And Vomiting     Social History   Socioeconomic History  . Marital status: Single  Tobacco Use  . Smoking  status: Former  . Smokeless tobacco: Never  Vaping Use  . Vaping status: Never Used  Substance and Sexual Activity  . Alcohol use: Yes    Alcohol/week: 1.0 standard drink of alcohol    Types: 1 Glasses of wine per week    Comment: occasionally  . Drug use: Never  . Sexual activity: Not Currently   Social Drivers of Health   Financial Resource Strain: Low Risk  (03/05/2024)   Overall Financial Resource Strain (CARDIA)   . Difficulty of Paying Living Expenses: Not hard at all  Food Insecurity: No Food Insecurity (03/05/2024)   Hunger Vital Sign   . Worried About Programme researcher, broadcasting/film/video in the Last Year: Never true   . Ran Out of Food in the Last Year: Never true  Transportation Needs: No Transportation Needs (03/05/2024)   PRAPARE - Transportation   . Lack of Transportation (Medical): No   . Lack of Transportation (Non-Medical): No  Social Connections: Moderately Isolated (01/03/2024)   Received from [facility]   Social Connection and Isolation Panel   . In a typical week, how many times do you talk on the phone with family, friends, or neighbors?: More than three times a week   .  How often do you get together with friends or relatives?: Three times a week   . How often do you attend church or religious services?: Never   . Do you belong to any clubs or organizations such as church groups, unions, fraternal or athletic groups, or school groups?: Yes   . How often do you attend meetings of the clubs or organizations you belong to?: More than 4 times per year   . Are you married, widowed, divorced, separated, never married, or living with a partner?: Never married  Housing Stability: Low Risk  (03/05/2024)   Housing Stability Vital Sign   . Unable to Pay for Housing in the Last Year: No   . Number of Times Moved in the Last Year: 0   . Homeless in the Last Year: No      Current Outpatient Medications:  .  acetaminophen  (TYLENOL ) 650 MG ER tablet, Take 650 mg by mouth every 8 (eight)  hours as needed for Pain, Disp: , Rfl:  .  ARIPiprazole  (ABILIFY ) 5 MG tablet, Take 1 tablet (5 mg total) by mouth once daily for 120 days, Disp: 30 tablet, Rfl: 3 .  azelastine  (ASTEPRO ) 137 mcg nasal spray, 137 mcg, Disp: , Rfl:  .  BREO ELLIPTA  200-25 mcg/dose DsDv, Inhale 1 inhalation into the lungs once daily   , Disp: , Rfl:  .  cetirizine (ZYRTEC) 10 MG tablet, Take 10 mg by mouth once daily, Disp: , Rfl:  .  clopidogreL  (PLAVIX ) 75 mg tablet, Take 1 tablet (75 mg total) by mouth once daily, Disp: 90 tablet, Rfl: 0 .  epinephrine (EPIPEN) 0.3 mg/0.3 mL pen injector, 0.3 mg/0.3 mL, Disp: , Rfl:  .  montelukast  (SINGULAIR ) 10 mg tablet, 10 mg, Disp: , Rfl:  .  pantoprazole  (PROTONIX ) 40 MG DR tablet, Take 1 tablet (40 mg total) by mouth 2 (two) times daily before meals for 90 days, Disp: 60 tablet, Rfl: 2 .  potassium chloride  40 mEq/15 mL oral solution, Take 7.5 mLs (20 mEq total) by mouth once daily for 360 days, Disp: 675 mL, Rfl: 1 .  PROAIR  HFA 90 mcg/actuation inhaler, Inhale 1 inhalation into the lungs every 4 (four) hours as needed   , Disp: , Rfl:  .  rosuvastatin  (CRESTOR ) 20 MG tablet, Take 1 tablet (20 mg total) by mouth once daily, Disp: 90 tablet, Rfl: 3 .  sertraline  (ZOLOFT ) 50 MG tablet, Take 1 tablet (50 mg total) by mouth once daily, Disp: 90 tablet, Rfl: 3 .  vit A/vit C/vit E/zinc/copper (PRESERVISION AREDS ORAL), Take 1 capsule by mouth 2 (two) times daily, Disp: , Rfl:   Vitals:   03/05/24 1346  BP: 131/80  Pulse: 82    Body mass index is 23.46 kg/m. No acute distress, pleasant  HEENT: Normocephalic and Atraumatic, Oropharynx is clear, Tympanic membranes clear, conjunctiva have normal color NECK: No bruits, thyromegalia or adenopathy is noted CHEST; No distress, normal to inspection, clear to auscultation CARDIOVASCULAR; Regular rate and rhythm, no murmurs rubs or gallops appreciated. Peripheral pulses were palpated and present.  ABDOMEN; Soft and flat and  nontender with bowel sounds appreciated in the normal range EXTREMITIES; No clubbing cyanosis or edema NEUROLOGICAL; Alert and responsive with good insight. Motor function and sensation are intact. Reflexes are present SKIN; No suspicious lesions are noted.    Appointment on 03/04/2024  Component Date Value Ref Range Status  . Glucose 03/04/2024 97  70 - 110 mg/dL Final  . Sodium 90/83/7974 139  136 - 145 mmol/L Final  . Potassium 03/04/2024 3.8  3.6 - 5.1 mmol/L Final  . Chloride 03/04/2024 108  97 - 109 mmol/L Final  . Carbon Dioxide (CO2) 03/04/2024 25.2  22.0 - 32.0 mmol/L Final  . Urea Nitrogen (BUN) 03/04/2024 9  7 - 25 mg/dL Final  . Creatinine 90/83/7974 1.0  0.6 - 1.1 mg/dL Final  . Glomerular Filtration Rate (eGFR) 03/04/2024 59 (L)  >60 mL/min/1.73sq m Final  . Calcium  03/04/2024 8.8  8.7 - 10.3 mg/dL Final  . AST  90/83/7974 17  8 - 39 U/L Final  . ALT  03/04/2024 9  5 - 38 U/L Final  . Alk Phos (alkaline Phosphatase) 03/04/2024 45  34 - 104 U/L Final  . Albumin 03/04/2024 3.3 (L)  3.5 - 4.8 g/dL Final  . Bilirubin, Total 03/04/2024 0.6  0.3 - 1.2 mg/dL Final  . Protein, Total 03/04/2024 5.9 (L)  6.1 - 7.9 g/dL Final  . A/G Ratio 90/83/7974 1.3  1.0 - 5.0 gm/dL Final  . Cholesterol, Total 03/04/2024 129  100 - 200 mg/dL Final  . Triglyceride 90/83/7974 98  35 - 199 mg/dL Final  . HDL (High Density Lipoprotein) Cho* 03/04/2024 61.7  35.0 - 85.0 mg/dL Final  . LDL Calculated 03/04/2024 48  0 - 130 mg/dL Final  . VLDL Cholesterol 03/04/2024 20  mg/dL Final  . Cholesterol/HDL Ratio 03/04/2024 2.1   Final  Office Visit on 12/18/2023  Component Date Value Ref Range Status  . Glucose 12/18/2023 92  70 - 110 mg/dL Final  . Sodium 92/98/7974 138  136 - 145 mmol/L Final  . Potassium 12/18/2023 3.2 (L)  3.6 - 5.1 mmol/L Final  . Chloride 12/18/2023 101  97 - 109 mmol/L Final  . Carbon Dioxide (CO2) 12/18/2023 24.2  22.0 - 32.0 mmol/L Final  . Urea Nitrogen (BUN) 12/18/2023 9  7  - 25 mg/dL Final  . Creatinine 92/98/7974 1.2 (H)  0.6 - 1.1 mg/dL Final  . Glomerular Filtration Rate (eGFR) 12/18/2023 48 (L)  >60 mL/min/1.73sq m Final  . Calcium  12/18/2023 8.6 (L)  8.7 - 10.3 mg/dL Final  . AST  92/98/7974 20  8 - 39 U/L Final  . ALT  12/18/2023 10  5 - 38 U/L Final  . Alk Phos (alkaline Phosphatase) 12/18/2023 41  34 - 104 U/L Final  . Albumin 12/18/2023 3.3 (L)  3.5 - 4.8 g/dL Final  . Bilirubin, Total 12/18/2023 0.6  0.3 - 1.2 mg/dL Final  . Protein, Total 12/18/2023 5.8 (L)  6.1 - 7.9 g/dL Final  . A/G Ratio 92/98/7974 1.3  1.0 - 5.0 gm/dL Final  Appointment on 94/78/7974  Component Date Value Ref Range Status  . Color 11/07/2023 Yellow  Colorless, Straw, Light Yellow, Yellow, Dark Yellow Final  . Clarity 11/07/2023 Turbid (!)  Clear Final  . Specific Gravity 11/07/2023 1.026  1.005 - 1.030 Final  . pH, Urine 11/07/2023 6.0  5.0 - 8.0 Final  . Protein, Urinalysis 11/07/2023 2+ (!)  Negative mg/dL Final  . Glucose, Urinalysis 11/07/2023 Negative  Negative mg/dL Final  . Ketones, Urinalysis 11/07/2023 Trace (!)  Negative mg/dL Final  . Blood, Urinalysis 11/07/2023 2+ (!)  Negative Final  . Nitrite, Urinalysis 11/07/2023 Negative  Negative Final  . Leukocyte Esterase, Urinalysis 11/07/2023 3+ (!)  Negative Final  . Bilirubin, Urinalysis 11/07/2023 1+ (!)  Negative Final  . Urobilinogen, Urinalysis 11/07/2023 3.0 (H)  0.2 - 1.0 mg/dL Final  . Mucous, Urine 11/07/2023 PRESENT ROLLEN)  None Seen Final  . WBC, UA 11/07/2023 >182 (H)  <=5 /hpf Final  . Red Blood Cells, Urinalysis 11/07/2023 101 (H)  <=3 /hpf Final  . Bacteria, Urinalysis 11/07/2023 0-5  0 - 5 /hpf Final  . Squamous Epithelial Cells, Urinaly* 11/07/2023 17  /hpf Final  . Urine Culture, Routine - [facility] 11/07/2023 Final report (!)   Final  . Result 1 - [facility] 11/07/2023 Citrobacter freundii (!)   Final  . Result 2 - [facility] 11/07/2023 Klebsiella pneumoniae (!)   Final  . Antimicrobial Susceptibility -  Lab* 11/07/2023 Comment   Final  Office Visit on 11/05/2023  Component Date Value Ref Range Status  . WBC (White Blood Cell Count) 11/05/2023 11.0 (H)  4.1 - 10.2 10^3/uL Final  . RBC (Red Blood Cell Count) 11/05/2023 3.90 (L)  4.04 - 5.48 10^6/uL Final  . Hemoglobin 11/05/2023 13.1  12.0 - 15.0 gm/dL Final  . Hematocrit 94/80/7974 39.9  35.0 - 47.0 % Final  . MCV (Mean Corpuscular Volume) 11/05/2023 102.3 (H)  80.0 - 100.0 fl Final  . MCH (Mean Corpuscular Hemoglobin) 11/05/2023 33.6 (H)  27.0 - 31.2 pg Final  . MCHC (Mean Corpuscular Hemoglobin * 11/05/2023 32.8  32.0 - 36.0 gm/dL Final  . Platelet Count 11/05/2023 360  150 - 450 10^3/uL Final  . RDW-CV (Red Cell Distribution Widt* 11/05/2023 12.5  11.6 - 14.8 % Final  . MPV (Mean Platelet Volume) 11/05/2023 9.5  9.4 - 12.4 fl Final  . Neutrophils 11/05/2023 7.36  1.50 - 7.80 10^3/uL Final  . Lymphocytes 11/05/2023 2.07  1.00 - 3.60 10^3/uL Final  . Monocytes 11/05/2023 0.87  0.00 - 1.50 10^3/uL Final  . Eosinophils 11/05/2023 0.49  0.00 - 0.55 10^3/uL Final  . Basophils 11/05/2023 0.11 (H)  0.00 - 0.09 10^3/uL Final  . Neutrophil % 11/05/2023 67.2  32.0 - 70.0 % Final  . Lymphocyte % 11/05/2023 18.9  10.0 - 50.0 % Final  . Monocyte % 11/05/2023 7.9  4.0 - 13.0 % Final  . Eosinophil % 11/05/2023 4.5  1.0 - 5.0 % Final  . Basophil% 11/05/2023 1.0  0.0 - 2.0 % Final  . Immature Granulocyte % 11/05/2023 0.5  <=0.7 % Final  . Immature Granulocyte Count 11/05/2023 0.05  <=0.06 10^3/L Final  . Glucose 11/05/2023 99  70 - 110 mg/dL Final  . Sodium 94/80/7974 139  136 - 145 mmol/L Final  . Potassium 11/05/2023 4.0  3.6 - 5.1 mmol/L Final  . Chloride 11/05/2023 106  97 - 109 mmol/L Final  . Carbon Dioxide (CO2) 11/05/2023 24.3  22.0 - 32.0 mmol/L Final  . Urea Nitrogen (BUN) 11/05/2023 9  7 - 25 mg/dL Final  . Creatinine 94/80/7974 0.8  0.6 - 1.1 mg/dL Final  . Glomerular Filtration Rate (eGFR) 11/05/2023 77  >60 mL/min/1.73sq m Final  .  Calcium  11/05/2023 8.9  8.7 - 10.3 mg/dL Final  . AST  94/80/7974 19  8 - 39 U/L Final  . ALT  11/05/2023 12  5 - 38 U/L Final  . Alk Phos (alkaline Phosphatase) 11/05/2023 43  34 - 104 U/L Final  . Albumin 11/05/2023 3.7  3.5 - 4.8 g/dL Final  . Bilirubin, Total 11/05/2023 0.5  0.3 - 1.2 mg/dL Final  . Protein, Total 11/05/2023 5.9 (L)  6.1 - 7.9 g/dL Final  . A/G Ratio 94/80/7974 1.7  1.0 - 5.0 gm/dL Final  . TSH - [facility] 11/05/2023 2.280  0.450 - 4.500 uIU/mL Final  . T4, Total -  [facility] 11/05/2023 7.1  4.5 - 12.0 ug/dL Final  Office Visit on 10/22/2023  Component Date Value Ref Range Status  . WBC (White Blood Cell Count) 10/22/2023 10.3 (H)  4.1 - 10.2 10^3/uL Final  . RBC (Red Blood Cell Count) 10/22/2023 3.89 (L)  4.04 - 5.48 10^6/uL Final  . Hemoglobin 10/22/2023 13.2  12.0 - 15.0 gm/dL Final  . Hematocrit 94/94/7974 39.2  35.0 - 47.0 % Final  . MCV (Mean Corpuscular Volume) 10/22/2023 100.8 (H)  80.0 - 100.0 fl Final  . MCH (Mean Corpuscular Hemoglobin) 10/22/2023 33.9 (H)  27.0 - 31.2 pg Final  . MCHC (Mean Corpuscular Hemoglobin * 10/22/2023 33.7  32.0 - 36.0 gm/dL Final  . Platelet Count 10/22/2023 307  150 - 450 10^3/uL Final  . RDW-CV (Red Cell Distribution Widt* 10/22/2023 12.4  11.6 - 14.8 % Final  . MPV (Mean Platelet Volume) 10/22/2023 9.1 (L)  9.4 - 12.4 fl Final  . Neutrophils 10/22/2023 7.10  1.50 - 7.80 10^3/uL Final  . Lymphocytes 10/22/2023 1.84  1.00 - 3.60 10^3/uL Final  . Monocytes 10/22/2023 0.89  0.00 - 1.50 10^3/uL Final  . Eosinophils 10/22/2023 0.34  0.00 - 0.55 10^3/uL Final  . Basophils 10/22/2023 0.09  0.00 - 0.09 10^3/uL Final  . Neutrophil % 10/22/2023 68.8  32.0 - 70.0 % Final  . Lymphocyte % 10/22/2023 17.8  10.0 - 50.0 % Final  . Monocyte % 10/22/2023 8.6  4.0 - 13.0 % Final  . Eosinophil % 10/22/2023 3.3  1.0 - 5.0 % Final  . Basophil% 10/22/2023 0.9  0.0 - 2.0 % Final  . Immature Granulocyte % 10/22/2023 0.6  <=0.7 % Final  . Immature  Granulocyte Count 10/22/2023 0.06  <=0.06 10^3/L Final  . Glucose 10/22/2023 109  70 - 110 mg/dL Final  . Sodium 94/94/7974 141  136 - 145 mmol/L Final  . Potassium 10/22/2023 2.8 (L)  3.6 - 5.1 mmol/L Final  . Chloride 10/22/2023 104  97 - 109 mmol/L Final  . Carbon Dioxide (CO2) 10/22/2023 26.7  22.0 - 32.0 mmol/L Final  . Urea Nitrogen (BUN) 10/22/2023 8  7 - 25 mg/dL Final  . Creatinine 94/94/7974 0.8  0.6 - 1.1 mg/dL Final  . Glomerular Filtration Rate (eGFR) 10/22/2023 77  >60 mL/min/1.73sq m Final  . Calcium  10/22/2023 8.7  8.7 - 10.3 mg/dL Final  . AST  94/94/7974 19  8 - 39 U/L Final  . ALT  10/22/2023 13  5 - 38 U/L Final  . Alk Phos (alkaline Phosphatase) 10/22/2023 42  34 - 104 U/L Final  . Albumin 10/22/2023 3.7  3.5 - 4.8 g/dL Final  . Bilirubin, Total 10/22/2023 0.6  0.3 - 1.2 mg/dL Final  . Protein, Total 10/22/2023 6.4  6.1 - 7.9 g/dL Final  . A/G Ratio 94/94/7974 1.4  1.0 - 5.0 gm/dL Final  . Color 94/94/7974 Yellow  Colorless, Straw, Light Yellow, Yellow, Dark Yellow Final  . Clarity 10/22/2023 Cloudy (!)  Clear Final  . Specific Gravity 10/22/2023 1.026  1.005 - 1.030 Final  . pH, Urine 10/22/2023 6.5  5.0 - 8.0 Final  . Protein, Urinalysis 10/22/2023 1+ (!)  Negative mg/dL Final  . Glucose, Urinalysis 10/22/2023 Negative  Negative mg/dL Final  . Ketones, Urinalysis 10/22/2023 1+ (!)  Negative mg/dL Final  . Blood, Urinalysis 10/22/2023 Trace (!)  Negative Final  . Nitrite, Urinalysis 10/22/2023 Negative  Negative Final  . Leukocyte Esterase, Urinalysis 10/22/2023 3+ (!)  Negative Final  . Bilirubin,  Urinalysis 10/22/2023 1+ (!)  Negative Final  . Urobilinogen, Urinalysis 10/22/2023 3.0 (H)  0.2 - 1.0 mg/dL Final  . Mucous, Urine 10/22/2023 PRESENT (!)  None Seen Final  . WBC, UA 10/22/2023 41 (H)  <=5 /hpf Final  . Red Blood Cells, Urinalysis 10/22/2023 2  <=3 /hpf Final  . Bacteria, Urinalysis 10/22/2023 0-5  0 - 5 /hpf Final  . Squamous Epithelial Cells,  Urinaly* 10/22/2023 9  /hpf Final  Appointment on 09/17/2023  Component Date Value Ref Range Status  . Vitamin D, 25-Hydroxy - [facility] 09/17/2023 30.1  30.0 - 100.0 ng/mL Final  . Glucose 09/17/2023 86  70 - 110 mg/dL Final  . Sodium 96/68/7974 137  136 - 145 mmol/L Final  . Potassium 09/17/2023 4.1  3.6 - 5.1 mmol/L Final  . Chloride 09/17/2023 103  97 - 109 mmol/L Final  . Carbon Dioxide (CO2) 09/17/2023 27.9  22.0 - 32.0 mmol/L Final  . Calcium  09/17/2023 9.1  8.7 - 10.3 mg/dL Final  . Urea Nitrogen (BUN) 09/17/2023 14  7 - 25 mg/dL Final  . Creatinine 96/68/7974 0.9  0.6 - 1.1 mg/dL Final  . Glomerular Filtration Rate (eGFR) 09/17/2023 67  >60 mL/min/1.73sq m Final  . BUN/Crea Ratio 09/17/2023 15.6  6.0 - 20.0 Final  . Anion Gap w/K 09/17/2023 10.2  6.0 - 16.0 Final  Ancillary Orders on 08/27/2023  Component Date Value Ref Range Status  . Glucose 08/27/2023 89  70 - 110 mg/dL Final  . Sodium 96/89/7974 140  136 - 145 mmol/L Final  . Potassium 08/27/2023 4.2  3.6 - 5.1 mmol/L Final  . Chloride 08/27/2023 105  97 - 109 mmol/L Final  . Carbon Dioxide (CO2) 08/27/2023 29.5  22.0 - 32.0 mmol/L Final  . Urea Nitrogen (BUN) 08/27/2023 11  7 - 25 mg/dL Final  . Creatinine 96/89/7974 0.8  0.6 - 1.1 mg/dL Final  . Glomerular Filtration Rate (eGFR) 08/27/2023 77  >60 mL/min/1.73sq m Final  . Calcium  08/27/2023 9.0  8.7 - 10.3 mg/dL Final  . AST  96/89/7974 28  8 - 39 U/L Final  . ALT  08/27/2023 29  5 - 38 U/L Final  . Alk Phos (alkaline Phosphatase) 08/27/2023 62  34 - 104 U/L Final  . Albumin 08/27/2023 4.1  3.5 - 4.8 g/dL Final  . Bilirubin, Total 08/27/2023 0.6  0.3 - 1.2 mg/dL Final  . Protein, Total 08/27/2023 6.7  6.1 - 7.9 g/dL Final  . A/G Ratio 96/89/7974 1.6  1.0 - 5.0 gm/dL Final  . Cholesterol, Total 08/27/2023 175  100 - 200 mg/dL Final  . Triglyceride 96/89/7974 101  35 - 199 mg/dL Final  . HDL (High Density Lipoprotein) Cho* 08/27/2023 103.9 (H)  35.0 - 85.0 mg/dL Final   . LDL Calculated 08/27/2023 51  0 - 130 mg/dL Final  . VLDL Cholesterol 08/27/2023 20  mg/dL Final  . Cholesterol/HDL Ratio 08/27/2023 1.7   Final    ASSESSMENT  AND PLAN:  Diagnoses and all orders for this visit:  Routine general medical examination at a health care facility  Depression screening (Z13.31) -     Depression Screen -(PHQ- 2/9, BDI)  Health care maintenance  Mild asthma with acute exacerbation, unspecified whether persistent (HHS-HCC) Assessment & Plan: Rescue inhaler use is not escalating and no increase in flairs has been noted.     HTN (hypertension), benign Assessment & Plan: Taking medications without noted side effects or dizziness.     Major depression in remission ()  Pure hypercholesterolemia Assessment & Plan: Healthy fat diet is being followed and no myalgia's are noted.    Other orders -     sertraline  (ZOLOFT ) 50 MG tablet; Take 1 tablet (50 mg total) by mouth once daily      Goals     . Follow my doctor's care plan        Off sertraline  given confusion at the pharmacy, restart and fu 1 mo , consider colon again once wt etc is improved

## 2024-04-06 ENCOUNTER — Emergency Department

## 2024-04-06 ENCOUNTER — Other Ambulatory Visit: Payer: Self-pay

## 2024-04-06 ENCOUNTER — Emergency Department
Admission: EM | Admit: 2024-04-06 | Discharge: 2024-04-06 | Disposition: A | Discharge status: Home / Self Care | Attending: Emergency Medicine | Admitting: Emergency Medicine

## 2024-04-06 DIAGNOSIS — R770 Abnormality of albumin: Secondary | ICD-10-CM | POA: Insufficient documentation

## 2024-04-06 DIAGNOSIS — R531 Weakness: Secondary | ICD-10-CM | POA: Diagnosis not present

## 2024-04-06 DIAGNOSIS — I10 Essential (primary) hypertension: Secondary | ICD-10-CM | POA: Diagnosis not present

## 2024-04-06 DIAGNOSIS — J479 Bronchiectasis, uncomplicated: Secondary | ICD-10-CM | POA: Insufficient documentation

## 2024-04-06 DIAGNOSIS — R630 Anorexia: Secondary | ICD-10-CM | POA: Insufficient documentation

## 2024-04-06 DIAGNOSIS — J45909 Unspecified asthma, uncomplicated: Secondary | ICD-10-CM | POA: Insufficient documentation

## 2024-04-06 DIAGNOSIS — I700 Atherosclerosis of aorta: Secondary | ICD-10-CM | POA: Insufficient documentation

## 2024-04-06 DIAGNOSIS — R110 Nausea: Secondary | ICD-10-CM | POA: Insufficient documentation

## 2024-04-06 DIAGNOSIS — K449 Diaphragmatic hernia without obstruction or gangrene: Secondary | ICD-10-CM | POA: Diagnosis not present

## 2024-04-06 DIAGNOSIS — N390 Urinary tract infection, site not specified: Secondary | ICD-10-CM | POA: Diagnosis not present

## 2024-04-06 DIAGNOSIS — K5730 Diverticulosis of large intestine without perforation or abscess without bleeding: Secondary | ICD-10-CM | POA: Insufficient documentation

## 2024-04-06 DIAGNOSIS — R197 Diarrhea, unspecified: Secondary | ICD-10-CM | POA: Insufficient documentation

## 2024-04-06 LAB — COMPREHENSIVE METABOLIC PANEL WITH GFR
ALT: 20 U/L (ref 0–44)
AST: 42 U/L — ABNORMAL HIGH (ref 15–41)
Albumin: 2.8 g/dL — ABNORMAL LOW (ref 3.5–5.0)
Alkaline Phosphatase: 58 U/L (ref 38–126)
Anion gap: 15 (ref 5–15)
BUN: 8 mg/dL (ref 8–23)
CO2: 20 mmol/L — ABNORMAL LOW (ref 22–32)
Calcium: 8.8 mg/dL — ABNORMAL LOW (ref 8.9–10.3)
Chloride: 102 mmol/L (ref 98–111)
Creatinine, Ser: 0.93 mg/dL (ref 0.44–1.00)
GFR, Estimated: >60 mL/min (ref >60)
Glucose, Bld: 113 mg/dL — ABNORMAL HIGH (ref 70–99)
Potassium: 3.4 mmol/L — ABNORMAL LOW (ref 3.5–5.1)
Sodium: 137 mmol/L (ref 135–145)
Total Bilirubin: 0.7 mg/dL (ref 0.0–1.2)
Total Protein: 6.4 g/dL — ABNORMAL LOW (ref 6.5–8.1)

## 2024-04-06 LAB — CBC
HCT: 39.5 % (ref 36.0–46.0)
Hemoglobin: 12.8 g/dL (ref 12.0–15.0)
MCH: 32.2 pg (ref 26.0–34.0)
MCHC: 32.4 g/dL (ref 30.0–36.0)
MCV: 99.2 fL (ref 80.0–100.0)
Platelets: 299 K/uL (ref 150–400)
RBC: 3.98 MIL/uL (ref 3.87–5.11)
RDW: 14 % (ref 11.5–15.5)
WBC: 13.6 K/uL — ABNORMAL HIGH (ref 4.0–10.5)
nRBC: 0 % (ref 0.0–0.2)

## 2024-04-06 LAB — URINALYSIS, ROUTINE W REFLEX MICROSCOPIC
Bilirubin Urine: NEGATIVE
Glucose, UA: NEGATIVE mg/dL
Ketones, ur: NEGATIVE mg/dL
Nitrite: NEGATIVE
Protein, ur: 30 mg/dL — AB
Specific Gravity, Urine: 1.008 (ref 1.005–1.030)
pH: 6 (ref 5.0–8.0)

## 2024-04-06 LAB — LIPASE, BLOOD: Lipase: 46 U/L (ref 11–51)

## 2024-04-06 MED ORDER — SODIUM CHLORIDE 0.9 % IV BOLUS
500.0000 mL | Freq: Once | INTRAVENOUS | Status: AC
  Administered 2024-04-06: 500 mL via INTRAVENOUS

## 2024-04-06 MED ORDER — ONDANSETRON 4 MG PO TBDP: 4.0000 mg | ORAL_TABLET | Freq: Three times a day (TID) | ORAL | 0 refills | Status: AC | PRN

## 2024-04-06 MED ORDER — CEPHALEXIN 500 MG PO CAPS: 500.0000 mg | ORAL_CAPSULE | Freq: Two times a day (BID) | ORAL | 0 refills | Status: DC

## 2024-04-06 MED ORDER — CEPHALEXIN 250 MG/5ML PO SUSR: 500.0000 mg | Freq: Two times a day (BID) | ORAL | 0 refills | Status: AC

## 2024-04-06 MED ORDER — LOPERAMIDE HCL 2 MG PO TABS: 2.0000 mg | ORAL_TABLET | Freq: Four times a day (QID) | ORAL | 0 refills | Status: AC | PRN

## 2024-04-06 NOTE — ED Notes (Addendum)
 Pt able to stand and pivot with 1 person assistance to bedside commode, urine sample obtained and sent to lab.

## 2024-04-06 NOTE — ED Provider Notes (Signed)
 [facility] Provider Note    Event Date/Time   First MD Initiated Contact with Patient 04/06/24 1522     (approximate)   History   Diarrhea   HPI  Jenna Carlson is a 73 y.o. female with a history of hypertension, hyperlipidemia, and asthma who presents with diarrhea for the last week, bowel movements occurring about once per day, mixture of solid and loose stool, with no blood in the stool.  She reports nausea and decreased appetite.  She has no abdominal pain.  She denies any fever.  She reports feeling very weak and somewhat lightheaded.  I reviewed the past medical records.  The patient's most recent outpatient counter was with internal medicine on 9/17 for an annual exam and follow-up of her chronic conditions.   Physical Exam   Triage Vital Signs: ED Triage Vitals  Encounter Vitals Group     BP 04/06/24 1511 112/87     Girls Systolic BP Percentile --      Girls Diastolic BP Percentile --      Boys Systolic BP Percentile --      Boys Diastolic BP Percentile --      Pulse Rate 04/06/24 1511 87     Resp 04/06/24 1511 18     Temp 04/06/24 1511 98.1 F (36.7 C)     Temp Source 04/06/24 1511 Oral     SpO2 04/06/24 1511 98 %     Weight --      Height --      Head Circumference --      Peak Flow --      Pain Score 04/06/24 1512 0     Pain Loc --      Pain Education --      Exclude from Growth Chart --     Most recent vital signs: Vitals:   04/06/24 1700 04/06/24 1800  BP: (!) 166/97 (!) 169/98  Pulse: 66 60  Resp: 11 16  Temp:    SpO2: 97% 100%     General: Awake, no distress.  CV:  Good peripheral perfusion.  Resp:  Normal effort.  Abd:  Soft and nontender.  No distention.  Other:  Slightly dry mucous membranes.  No jaundice or scleral icterus.   ED Results / Procedures / Treatments   Labs (all labs ordered are listed, but only abnormal results are displayed) Labs Reviewed  COMPREHENSIVE METABOLIC PANEL WITH GFR -  Abnormal; Notable for the following components:      Result Value   Potassium 3.4 (*)    CO2 20 (*)    Glucose, Bld 113 (*)    Calcium  8.8 (*)    Total Protein 6.4 (*)    Albumin 2.8 (*)    AST 42 (*)    All other components within normal limits  CBC - Abnormal; Notable for the following components:   WBC 13.6 (*)    All other components within normal limits  URINALYSIS, ROUTINE W REFLEX MICROSCOPIC - Abnormal; Notable for the following components:   Color, Urine YELLOW (*)    APPearance CLEAR (*)    Hgb urine dipstick MODERATE (*)    Protein, ur 30 (*)    Leukocytes,Ua SMALL (*)    Bacteria, UA RARE (*)    All other components within normal limits  GASTROINTESTINAL PANEL BY PCR, STOOL (REPLACES STOOL CULTURE)  C DIFFICILE QUICK SCREEN W PCR REFLEX    LIPASE, BLOOD     EKG  ED  ECG REPORT I, Waylon Cassis, the attending physician, personally viewed and interpreted this ECG.  Date: 04/06/2024 EKG Time: 1516 Rate: 86 Rhythm: normal sinus rhythm QRS Axis: normal Intervals: normal ST/T Wave abnormalities: Nonspecific ST abnormalities Narrative Interpretation: no evidence of acute ischemia    RADIOLOGY  CT abdomen/pelvis: I independently viewed and interpreted the images; there are no dilated bowel loops or any free air or free fluid.  Radiology report indicates the following:  IMPRESSION:  1. Right lower lobe bronchiectasis with associated airspace disease,  suggesting pneumonia.  2. No acute process in the abdomen and pelvis.  3. Moderate hiatal hernia.  4. Diverticulosis without diverticulitis.  5. Aortic atherosclerosis.    PROCEDURES:  Critical Care performed: No  Procedures   MEDICATIONS ORDERED IN ED: Medications  sodium chloride  0.9 % bolus 500 mL (0 mLs Intravenous Stopped 04/06/24 1733)     IMPRESSION / MDM / ASSESSMENT AND PLAN / ED COURSE  I reviewed the triage vital signs and the nursing notes.  73 year old female with PMH as noted  above presents with diarrhea for the last week associated with nausea, decreased appetite, and generalized weakness.  She has no significant abdominal pain.  On exam her vital signs are normal.  Abdomen soft and nontender.  Differential diagnosis includes, but is not limited to, colitis, gastroenteritis, diverticulitis, inflammatory bowel disease.  We will obtain lab workup and give IV fluids.  CBC shows leukocytosis.  Based on the patient's age and this finding we will obtain CT to rule out an acute intra-abdominal process.  Patient's presentation is most consistent with acute complicated illness / injury requiring diagnostic workup.  ----------------------------------------- 6:44 PM on 04/06/2024 -----------------------------------------  CT shows no acute intra-abdominal process.  There is some atelectasis with a suggestion of possible pneumonia, however the patient has no cough, shortness of breath, chest pain, or fever.  Clinically there is no evidence of pneumonia.  CMP shows mild hypoalbuminemia but no acute concerning findings.  Lipase is normal.  Urinalysis shows findings compatible with possible UTI which could explain the leukocytosis.  The patient states she is feeling somewhat better.  She appears comfortable.  At this time, there is no indication for inpatient admission.  The patient feels comfortable going home.  She is stable for discharge.  I counseled her on the results of the workup.  I will give Keflex  for presumed UTI and symptomatic treatment for the diarrhea.  She should follow-up with her primary care doctor.  I counseled her on the CT findings but there is no indication for empiric treatment for pneumonia since the patient is not demonstrating any symptoms.  I gave strict return precautions, and the patient expressed understanding.   FINAL CLINICAL IMPRESSION(S) / ED DIAGNOSES   Final diagnoses:  Diarrhea, unspecified type  Urinary tract infection without hematuria, site  unspecified     Rx / DC Orders   ED Discharge Orders          Ordered    cephALEXin  (KEFLEX ) 500 MG capsule  2 times daily        04/06/24 1843    loperamide  (IMODIUM  A-D) 2 MG tablet  4 times daily PRN        04/06/24 1843             Note:  This document was prepared using Dragon voice recognition software and may include unintentional dictation errors.    Cassis Waylon, MD 04/06/24 1845

## 2024-04-06 NOTE — Discharge Instructions (Signed)
 Take the antibiotics as prescribed and finish the full 7-day course.  You may use the loperamide  as needed for the diarrhea.  Follow-up with your primary care doctor.  Return to the ER for new, worsening, or persistent severe diarrhea, blood in the stool, fever, weakness, difficulty breathing, or any other new or worsening symptoms that concern you.

## 2024-04-06 NOTE — ED Triage Notes (Signed)
 Pt to ED via [facility] from home. Pt reports chronic diarrhea and reports this past week has been having it every day. Pt reports some nausea, no vomiting or abd pain.   117/83 97.7 oral  96% RA  84 HR  CBG 99

## 2024-04-15 ENCOUNTER — Other Ambulatory Visit: Payer: Self-pay | Admitting: Internal Medicine

## 2024-04-15 DIAGNOSIS — Z1231 Encounter for screening mammogram for malignant neoplasm of breast: Secondary | ICD-10-CM

## 2024-05-14 ENCOUNTER — Ambulatory Visit
Admission: RE | Admit: 2024-05-14 | Discharge: 2024-05-14 | Disposition: A | Discharge status: Home / Self Care | Source: Ambulatory Visit | Attending: Internal Medicine | Admitting: Internal Medicine

## 2024-05-14 DIAGNOSIS — Z1231 Encounter for screening mammogram for malignant neoplasm of breast: Secondary | ICD-10-CM | POA: Diagnosis present
